# Patient Record
Sex: Male | Born: 1935
Health system: Southern US, Community
[De-identification: ages and names within clinical notes are randomized; demographics above are authoritative.]

## PROBLEM LIST (undated history)

## (undated) DIAGNOSIS — E0590 Thyrotoxicosis, unspecified without thyrotoxic crisis or storm: Secondary | ICD-10-CM

## (undated) DIAGNOSIS — E291 Testicular hypofunction: Secondary | ICD-10-CM

## (undated) DIAGNOSIS — R9431 Abnormal electrocardiogram [ECG] [EKG]: Secondary | ICD-10-CM

## (undated) DIAGNOSIS — H532 Diplopia: Secondary | ICD-10-CM

## (undated) DIAGNOSIS — K5790 Diverticulosis of intestine, part unspecified, without perforation or abscess without bleeding: Secondary | ICD-10-CM

## (undated) DIAGNOSIS — N529 Male erectile dysfunction, unspecified: Secondary | ICD-10-CM

## (undated) DIAGNOSIS — E785 Hyperlipidemia, unspecified: Secondary | ICD-10-CM

## (undated) DIAGNOSIS — Z8639 Personal history of other endocrine, nutritional and metabolic disease: Secondary | ICD-10-CM

## (undated) DIAGNOSIS — D126 Benign neoplasm of colon, unspecified: Secondary | ICD-10-CM

## (undated) DIAGNOSIS — K559 Vascular disorder of intestine, unspecified: Secondary | ICD-10-CM

## (undated) DIAGNOSIS — N401 Enlarged prostate with lower urinary tract symptoms: Secondary | ICD-10-CM

## (undated) DIAGNOSIS — M549 Dorsalgia, unspecified: Secondary | ICD-10-CM

## (undated) HISTORY — DX: Thyrotoxicosis, unspecified without thyrotoxic crisis or storm: E05.90

## (undated) HISTORY — DX: Benign prostatic hyperplasia with lower urinary tract symptoms: N40.1

## (undated) HISTORY — DX: Testicular hypofunction: E29.1

## (undated) HISTORY — DX: Diverticulosis of intestine, part unspecified, without perforation or abscess without bleeding: K57.90

## (undated) HISTORY — DX: Personal history of other endocrine, nutritional and metabolic disease: Z86.39

## (undated) HISTORY — DX: Vascular disorder of intestine, unspecified: K55.9

## (undated) HISTORY — DX: Male erectile dysfunction, unspecified: N52.9

## (undated) HISTORY — DX: Dorsalgia, unspecified: M54.9

## (undated) HISTORY — DX: Diplopia: H53.2

## (undated) HISTORY — DX: Hyperlipidemia, unspecified: E78.5

## (undated) HISTORY — DX: Abnormal electrocardiogram (ECG) (EKG): R94.31

## (undated) HISTORY — DX: Benign neoplasm of colon, unspecified: D12.6

## (undated) HISTORY — PX: COLONOSCOPY: SHX174

---

## 2002-03-30 DIAGNOSIS — D126 Benign neoplasm of colon, unspecified: Secondary | ICD-10-CM

## 2002-03-30 HISTORY — DX: Benign neoplasm of colon, unspecified: D12.6

## 2002-09-22 ENCOUNTER — Ambulatory Visit (HOSPITAL_COMMUNITY): Admission: RE | Admit: 2002-09-22 | Discharge: 2002-09-22 | Payer: Self-pay | Admitting: Gastroenterology

## 2002-09-22 ENCOUNTER — Encounter (INDEPENDENT_AMBULATORY_CARE_PROVIDER_SITE_OTHER): Payer: Self-pay | Admitting: Specialist

## 2003-09-13 ENCOUNTER — Encounter: Payer: Self-pay | Admitting: Internal Medicine

## 2004-09-09 ENCOUNTER — Ambulatory Visit: Payer: Self-pay | Admitting: Internal Medicine

## 2004-09-10 ENCOUNTER — Ambulatory Visit: Payer: Self-pay | Admitting: Internal Medicine

## 2005-07-09 ENCOUNTER — Ambulatory Visit: Payer: Self-pay | Admitting: Internal Medicine

## 2005-07-13 ENCOUNTER — Encounter: Admission: RE | Admit: 2005-07-13 | Discharge: 2005-07-13 | Payer: Self-pay | Admitting: Internal Medicine

## 2005-07-23 ENCOUNTER — Ambulatory Visit: Payer: Self-pay | Admitting: Internal Medicine

## 2005-07-30 ENCOUNTER — Encounter: Admission: RE | Admit: 2005-07-30 | Discharge: 2005-07-30 | Payer: Self-pay | Admitting: Internal Medicine

## 2005-08-06 ENCOUNTER — Ambulatory Visit: Payer: Self-pay | Admitting: Internal Medicine

## 2005-08-13 ENCOUNTER — Ambulatory Visit: Payer: Self-pay | Admitting: Gastroenterology

## 2005-12-18 ENCOUNTER — Ambulatory Visit: Payer: Self-pay | Admitting: Gastroenterology

## 2005-12-28 DIAGNOSIS — Z8719 Personal history of other diseases of the digestive system: Secondary | ICD-10-CM | POA: Insufficient documentation

## 2005-12-31 ENCOUNTER — Encounter (INDEPENDENT_AMBULATORY_CARE_PROVIDER_SITE_OTHER): Payer: Self-pay | Admitting: Specialist

## 2005-12-31 ENCOUNTER — Encounter: Payer: Self-pay | Admitting: Internal Medicine

## 2005-12-31 ENCOUNTER — Ambulatory Visit: Payer: Self-pay | Admitting: Gastroenterology

## 2006-11-08 ENCOUNTER — Telehealth (INDEPENDENT_AMBULATORY_CARE_PROVIDER_SITE_OTHER): Payer: Self-pay | Admitting: *Deleted

## 2006-12-08 ENCOUNTER — Ambulatory Visit: Payer: Self-pay | Admitting: Internal Medicine

## 2006-12-08 DIAGNOSIS — E0590 Thyrotoxicosis, unspecified without thyrotoxic crisis or storm: Secondary | ICD-10-CM

## 2006-12-08 DIAGNOSIS — R9431 Abnormal electrocardiogram [ECG] [EKG]: Secondary | ICD-10-CM

## 2006-12-08 HISTORY — DX: Abnormal electrocardiogram (ECG) (EKG): R94.31

## 2006-12-08 HISTORY — DX: Thyrotoxicosis, unspecified without thyrotoxic crisis or storm: E05.90

## 2006-12-16 ENCOUNTER — Encounter: Admission: RE | Admit: 2006-12-16 | Discharge: 2006-12-16 | Payer: Self-pay | Admitting: Internal Medicine

## 2006-12-20 ENCOUNTER — Encounter (INDEPENDENT_AMBULATORY_CARE_PROVIDER_SITE_OTHER): Payer: Self-pay | Admitting: *Deleted

## 2007-01-10 ENCOUNTER — Ambulatory Visit: Payer: Self-pay | Admitting: Internal Medicine

## 2007-01-12 LAB — CONVERTED CEMR LAB
Direct LDL: 136 mg/dL
Triglycerides: 143 mg/dL (ref 0–149)

## 2008-02-24 ENCOUNTER — Ambulatory Visit: Payer: Self-pay | Admitting: Internal Medicine

## 2008-02-24 DIAGNOSIS — M549 Dorsalgia, unspecified: Secondary | ICD-10-CM | POA: Insufficient documentation

## 2008-02-24 HISTORY — DX: Dorsalgia, unspecified: M54.9

## 2008-02-28 ENCOUNTER — Telehealth (INDEPENDENT_AMBULATORY_CARE_PROVIDER_SITE_OTHER): Payer: Self-pay | Admitting: *Deleted

## 2008-02-28 LAB — CONVERTED CEMR LAB
Basophils Absolute: 0 10*3/uL (ref 0.0–0.1)
Calcium: 9.3 mg/dL (ref 8.4–10.5)
Cholesterol: 222 mg/dL (ref 0–200)
Direct LDL: 125.7 mg/dL
GFR calc Af Amer: 84 mL/min
GFR calc non Af Amer: 70 mL/min
HCT: 39.4 % (ref 39.0–52.0)
Hemoglobin: 13.4 g/dL (ref 13.0–17.0)
Lymphocytes Relative: 42.3 % (ref 12.0–46.0)
MCHC: 33.9 g/dL (ref 30.0–36.0)
Monocytes Absolute: 0.5 10*3/uL (ref 0.1–1.0)
Monocytes Relative: 14.2 % — ABNORMAL HIGH (ref 3.0–12.0)
Neutro Abs: 1.2 10*3/uL — ABNORMAL LOW (ref 1.4–7.7)
PSA: 3.04 ng/mL (ref 0.10–4.00)
Platelets: 241 10*3/uL (ref 150–400)
Potassium: 4.6 meq/L (ref 3.5–5.1)
RDW: 13.1 % (ref 11.5–14.6)
Sodium: 142 meq/L (ref 135–145)
TSH: 0.47 microintl units/mL (ref 0.35–5.50)
Total CHOL/HDL Ratio: 3.8
Triglycerides: 97 mg/dL (ref 0–149)
VLDL: 19 mg/dL (ref 0–40)

## 2008-12-04 ENCOUNTER — Encounter (INDEPENDENT_AMBULATORY_CARE_PROVIDER_SITE_OTHER): Payer: Self-pay | Admitting: *Deleted

## 2009-02-22 ENCOUNTER — Encounter: Payer: Self-pay | Admitting: Internal Medicine

## 2009-02-26 ENCOUNTER — Ambulatory Visit: Payer: Self-pay | Admitting: Internal Medicine

## 2009-02-26 ENCOUNTER — Telehealth (INDEPENDENT_AMBULATORY_CARE_PROVIDER_SITE_OTHER): Payer: Self-pay | Admitting: *Deleted

## 2009-02-26 DIAGNOSIS — N401 Enlarged prostate with lower urinary tract symptoms: Secondary | ICD-10-CM

## 2009-02-26 DIAGNOSIS — N138 Other obstructive and reflux uropathy: Secondary | ICD-10-CM

## 2009-02-26 DIAGNOSIS — N400 Enlarged prostate without lower urinary tract symptoms: Secondary | ICD-10-CM

## 2009-02-26 HISTORY — DX: Benign prostatic hyperplasia with lower urinary tract symptoms: N40.1

## 2009-02-26 HISTORY — DX: Other obstructive and reflux uropathy: N13.8

## 2009-02-27 ENCOUNTER — Encounter: Payer: Self-pay | Admitting: Internal Medicine

## 2009-02-27 LAB — CONVERTED CEMR LAB: RBC / HPF: NONE SEEN (ref <3)

## 2009-03-06 ENCOUNTER — Encounter: Payer: Self-pay | Admitting: Internal Medicine

## 2009-03-11 ENCOUNTER — Encounter: Payer: Self-pay | Admitting: Internal Medicine

## 2009-05-30 ENCOUNTER — Ambulatory Visit: Payer: Self-pay | Admitting: Internal Medicine

## 2009-05-30 DIAGNOSIS — N529 Male erectile dysfunction, unspecified: Secondary | ICD-10-CM

## 2009-05-30 DIAGNOSIS — E785 Hyperlipidemia, unspecified: Secondary | ICD-10-CM

## 2009-05-30 HISTORY — DX: Male erectile dysfunction, unspecified: N52.9

## 2009-05-30 HISTORY — DX: Hyperlipidemia, unspecified: E78.5

## 2009-06-13 ENCOUNTER — Encounter: Payer: Self-pay | Admitting: Internal Medicine

## 2009-09-19 ENCOUNTER — Encounter: Payer: Self-pay | Admitting: Internal Medicine

## 2010-01-13 ENCOUNTER — Ambulatory Visit: Payer: Self-pay | Admitting: Internal Medicine

## 2010-01-13 DIAGNOSIS — J069 Acute upper respiratory infection, unspecified: Secondary | ICD-10-CM | POA: Insufficient documentation

## 2010-01-13 LAB — CONVERTED CEMR LAB
ALT: 27 units/L (ref 0–53)
AST: 31 units/L (ref 0–37)
Bilirubin Urine: NEGATIVE
Bilirubin, Direct: 0.1 mg/dL (ref 0.0–0.3)
Eosinophils Relative: 4.3 % (ref 0.0–5.0)
Folate: 18.3 ng/mL
GFR calc non Af Amer: 87.77 mL/min (ref >60)
HCT: 40.2 % (ref 39.0–52.0)
Hemoglobin: 13.9 g/dL (ref 13.0–17.0)
Lymphocytes Relative: 28.6 % (ref 12.0–46.0)
Lymphs Abs: 1.2 10*3/uL (ref 0.7–4.0)
Monocytes Relative: 16.2 % — ABNORMAL HIGH (ref 3.0–12.0)
Neutro Abs: 2.1 10*3/uL (ref 1.4–7.7)
Nitrite: NEGATIVE
Potassium: 4.9 meq/L (ref 3.5–5.1)
RBC: 4.47 M/uL (ref 4.22–5.81)
Sodium: 142 meq/L (ref 135–145)
TSH: 0.03 microintl units/mL — ABNORMAL LOW (ref 0.35–5.50)
Total Bilirubin: 0.7 mg/dL (ref 0.3–1.2)
Urobilinogen, UA: 0.2
WBC: 4.2 10*3/uL — ABNORMAL LOW (ref 4.5–10.5)

## 2010-01-16 LAB — CONVERTED CEMR LAB: Free T4: 2.91 ng/dL — ABNORMAL HIGH (ref 0.60–1.60)

## 2010-01-21 ENCOUNTER — Ambulatory Visit: Payer: Self-pay | Admitting: Endocrinology

## 2010-02-03 ENCOUNTER — Ambulatory Visit: Payer: Self-pay | Admitting: Internal Medicine

## 2010-02-03 DIAGNOSIS — E291 Testicular hypofunction: Secondary | ICD-10-CM

## 2010-02-03 HISTORY — DX: Testicular hypofunction: E29.1

## 2010-02-25 ENCOUNTER — Ambulatory Visit: Payer: Self-pay | Admitting: Endocrinology

## 2010-02-25 LAB — CONVERTED CEMR LAB: Free T4: 0.73 ng/dL (ref 0.60–1.60)

## 2010-03-17 ENCOUNTER — Encounter: Payer: Self-pay | Admitting: Internal Medicine

## 2010-04-27 LAB — CONVERTED CEMR LAB
ALT: 14 units/L
AST: 23 units/L
BUN: 14 mg/dL
BUN: 9 mg/dL (ref 6–23)
Calcium: 9.3 mg/dL
Calcium: 9.3 mg/dL (ref 8.4–10.5)
Chloride: 103 meq/L (ref 96–112)
Creatinine, Ser: 1.1 mg/dL (ref 0.4–1.5)
Free T4: 0.9 ng/dL (ref 0.6–1.6)
Free T4: 1.1 ng/dL (ref 0.6–1.6)
GFR calc non Af Amer: 70 mL/min
HCT: 45.6 %
HCT: 45.6 %
Hemoglobin: 13.5 g/dL (ref 13.0–17.0)
Nitrite: NEGATIVE
PSA: 2.42 ng/mL (ref 0.10–4.00)
PSA: 22.94 ng/mL — ABNORMAL HIGH (ref 0.10–4.00)
Platelets: 219 10*3/uL
RBC count: 5.03 10*6/uL
Sodium, serum: 140 mmol/L
Sodium: 139 meq/L (ref 135–145)
Specific Gravity, Urine: >=1.03
T3, Free: 3 pg/mL (ref 2.3–4.2)
TSH: 1.27 microintl units/mL (ref 0.35–5.50)
Total Bilirubin: 0.4 mg/dL
Urobilinogen, UA: 0.2
VLDL: 17.2 mg/dL (ref 0.0–40.0)
WBC, blood: 7.1 10*3/uL
WBC, blood: 7.1 10*3/uL
pH: 5
platelet count: 219 10*3/uL

## 2010-04-29 ENCOUNTER — Other Ambulatory Visit: Payer: Self-pay | Admitting: Endocrinology

## 2010-04-29 ENCOUNTER — Ambulatory Visit
Admission: RE | Admit: 2010-04-29 | Discharge: 2010-04-29 | Payer: Self-pay | Source: Home / Self Care | Attending: Endocrinology | Admitting: Endocrinology

## 2010-04-29 NOTE — Assessment & Plan Note (Signed)
Summary: 1 MTH FU  STC   Vital Signs:  Patient profile:   76 year old male Height:      69 inches (175.26 cm) Weight:      147.50 pounds (67.05 kg) BMI:     21.86 O2 Sat:      98 % on Room air Temp:     97.3 degrees F (36.28 degrees C) oral Pulse rate:   73 / minute Pulse rhythm:   regular BP sitting:   114 / 74  (left arm) Cuff size:   regular  Vitals Entered By: Brenton Grills CMA Duncan Dull) (February 25, 2010 3:46 PM)  O2 Flow:  Room air CC: 1 month F/U/pt is no longer taking Zithromax or Flonase/aj Is Patient Diabetic? No   CC:  1 month F/U/pt is no longer taking Zithromax or Flonase/aj.  History of Present Illness: he feels slightly better in general since on the tapazole.  however, appetite is still poor, and he still has fatigue.    Current Medications (verified): 1)  Mvt .Marland Kitchen.. 1 Qd 2)  Axiron 30 Mg/act Soln (Testosterone) .... Use 1 Pump in Each Axilla Once Daily 3)  Methimazole 10 Mg Tabs (Methimazole) .... 2 Tabs Two Times A Day 4)  Zithromax Z-Pak 250 Mg Tabs (Azithromycin) .... As Directed 5)  Flonase 50 Mcg/act Susp (Fluticasone Propionate) .... 2 Sprays On Each Side of The Nose Once Daily  Allergies (verified): No Known Drug Allergies  Past History:  Past Medical History: Last updated: 02/26/2009 chronic back pain HYPERTHYROIDISM  colonoscopy in 2004 and again in October 2007 repeat in 2012 ELECTROCARDIOGRAM, ABNORMAL, f/u by a normal   stress test on September 2005  h/o  Thyroid nodule, resolved by u/s 9-08  Review of Systems  The patient denies fever.    Physical Exam  General:  normal appearance.   Neck:  thyroid is minimally and diffusely enlarged   Impression & Recommendations:  Problem # 1:  HYPERTHYROIDISM (ICD-242.90) overcontrolled  Medications Added to Medication List This Visit: 1)  Methimazole 10 Mg Tabs (Methimazole) .Marland Kitchen.. 1 tab once daily  Other Orders: TLB-TSH (Thyroid Stimulating Hormone) (84443-TSH) TLB-T4 (Thyrox), Free  [phone]) Est. Patient Level III (40981)  Patient Instructions: 1)  blood tests are being ordered for you today.  please call [phone] to hear your test results. 2)  pending the test results, please continue the same medications for now. 3)  Please schedule a follow-up appointment in 2 months 4)  (update: i left message on phone-tree:  reduce tapazole to 10 mg once daily). Prescriptions: METHIMAZOLE 10 MG TABS (METHIMAZOLE) 1 tab once daily  #30 x 2   Entered and Authorized by:   Minus Breeding MD   Signed by:   Minus Breeding MD on 02/25/2010   Method used:   [facility] to        [facility] [address] [phone]* (retail)       [address]       [place], [place]  [address]       Ph: [phone] or [phone]       Fax: [phone]   RxID:   [id number]    Orders Added: 1)  TLB-TSH (Thyroid Stimulating Hormone) [84443-TSH] 2)  TLB-T4 (Thyrox), Free [33295-JO8C] 3)  Est. Patient Level III [16606]

## 2010-04-29 NOTE — Letter (Signed)
Summary: [facility]  [facility]   Imported By: Lanelle Bal 06/19/2009 09:41:38  _____________________________________________________________________  External Attachment:    Type:   Image     Comment:   External Document

## 2010-04-29 NOTE — Assessment & Plan Note (Signed)
Summary: 3 WEEK FOLLOWUP///SPH   Vital Signs:  Patient profile:   77 year old male Weight:      148 pounds Pulse rate:   104 / minute Pulse rhythm:   regular BP sitting:   122 / 76  (left arm) Cuff size:   regular  Vitals Entered By: Army Fossa CMA (February 03, 2010 1:38 PM) [facility]: 3 week f/u- not fasting, Depression Comments c/o HA today [facility]   History of Present Illness: ROV note from Dr Everardo All reviewed  good medication compliance w/ thyroid medicine    Current Medications (verified): 1)  Mvt .Marland Kitchen.. 1 Qd 2)  Axiron 30 Mg/act Soln (Testosterone) .... Use 1 Pump in Each Axilla Once Daily 3)  Methimazole 10 Mg Tabs (Methimazole) .... 2 Tabs Two Times A Day  Allergies (verified): No Known Drug Allergies  Past History:  Past Medical History: Reviewed history from 02/26/2009 and no changes required. chronic back pain HYPERTHYROIDISM  colonoscopy in 2004 and again in October 2007 repeat in 2012 ELECTROCARDIOGRAM, ABNORMAL, f/u by a normal   stress test on September 2005  h/o  Thyroid nodule, resolved by u/s 9-08  Past Surgical History: Reviewed history from 02/24/2008 and no changes required. no  Social History: Reviewed history from 01/21/2010 and no changes required. 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  afebrile 98.4  Head:  face symetric, no tender  Ears:  R ear normal.  L-- wax  Nose:  slightly  congested  Mouth:  no redness or d/c  Lungs:  normal respiratory effort, no intercostal retractions, no accessory muscle use, and normal breath sounds.   Heart:  HR 104    Impression &  Recommendations:  Problem # 1:  URI (ICD-465.9) persistent URI symptoms , Rx zpack  aware to call if fever   Problem # 2:  HYPERTHYROIDISM (ICD-242.90) per Dr Everardo All reminded patient to RTC 02-21-10 to see him  His updated medication list for this problem includes:    Methimazole 10 Mg Tabs (Methimazole) .Marland Kitchen... 2 tabs two times a day  Problem # 3:  WEIGHT LOSS (ICD-783.21) anticipate will get better w/ Rx for #2  Problem # 4:  HYPOGONADISM (ICD-257.2) on axeron per urology  Complete Medication List: 1)  Mvt  .Marland Kitchen.. 1 qd 2)  Axiron 30 Mg/act Soln (Testosterone) .... Use 1 pump in each axilla once daily 3)  Methimazole 10 Mg Tabs (Methimazole) .... 2 tabs two times a day 4)  Zithromax Z-pak 250 Mg Tabs (Azithromycin) .... As directed 5)  Flonase 50 Mcg/act Susp (Fluticasone propionate) .... 2 sprays on each side of the nose once daily  Patient Instructions: 1)  rest, fluids, tylenol 2)  zpack x 5 days, an  antibiotic 3)  flonase 2 sprays on each side of the nose once a day x 1 month 4)  call if no better in 10 days  5)  Please schedule a follow-up appointment in 6 months .  Prescriptions: FLONASE 50 MCG/ACT SUSP (FLUTICASONE PROPIONATE) 2 sprays on each side of the nose once daily  #1  x 1   Entered and Authorized by:   Nolon Rod. Arby Dahir MD   Signed by:   Nolon Rod. Livana Yerian MD on 02/03/2010   Method used:   Print then Give to Patient   RxID:   [id number] ZITHROMAX Z-PAK 250 MG TABS (AZITHROMYCIN) as directed  #1 x 0   Entered and Authorized by:   Nolon Rod. Lunell Robart MD   Signed by:   Nolon Rod. Hephzibah Strehle MD on 02/03/2010   Method used:   Print then Give to Patient   RxID:   [id number]    Orders Added: 1)  Est. Patient Level III [84696]

## 2010-04-29 NOTE — Assessment & Plan Note (Signed)
Summary: New Endo / Medicare / Hyperthyroidism, wt loss/Paz/cd   Vital Signs:  Patient profile:   77 year old male Height:      69 inches (175.26 cm) Weight:      147.50 pounds (67.05 kg) BMI:     21.86 O2 Sat:      95 % on Room air Temp:     98.1 degrees F (36.72 degrees C) oral Pulse rate:   104 / minute BP sitting:   120 / 76  (left arm) Cuff size:   regular  Vitals Entered By: Brenton Grills MA (January 21, 2010 3:54 PM)  O2 Flow:  Room air CC: New Endo/Hyperthyroid, Weight Loss/aj Is Patient Diabetic? No   CC:  New Endo/Hyperthyroid and Weight Loss/aj.  History of Present Illness: pt was noted on routine labs to have abnormal tft.  tsh was normal in 2010.   symptomatically, pt states 2 mos of slight warmth of the skin, and associated weight loss of 10 lbs.  he requests not to take i-131 rx, as he says it caused his wife to have weight gain.  Current Medications (verified): 1)  Mvt .Marland Kitchen.. 1 Qd 2)  Axiron 30 Mg/act Soln (Testosterone) .... Use 1 Pump in Each Axilla Once Daily  Allergies (verified): No Known Drug Allergies  Past History:  Past Medical History: Last updated: 02/26/2009 chronic back pain HYPERTHYROIDISM  colonoscopy in 2004 and again in October 2007 repeat in 2012 ELECTROCARDIOGRAM, ABNORMAL, f/u by a normal   stress test on September 2005  h/o  Thyroid nodule, resolved by u/s 9-08  Family History: MI--father had a heart attack late in life colon ca--sister  prostate cancer--no DM - no stroke - GP HTN - ?? no goiter or other thyroid problem  Social History: Reviewed history from 02/26/2009 and no changes required. 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         denies headache, hoarseness, double vision, palpitations, sob, diarrhea, myalgias, excessive diaphoresis, numbness, tremor, anxiety, hypoglycemia, easy  bruising, and rhinorrhea.  he has nocturia.  Physical Exam  General:  normal appearance.   Head:  head: no deformity eyes: no periorbital swelling, no proptosis external nose and ears are normal mouth: no lesion seen Neck:  thyroid is minimally and diffusely enlarged Lungs:  Clear to auscultation bilaterally. Normal respiratory effort.  Heart:  Regular rate and rhythm without murmurs or gallops noted. Normal S1,S2.   Abdomen:  abdomen is soft, nontender.  no hepatosplenomegaly.   not distended.  no hernia  Msk:  muscle bulk and strength are grossly normal.  no obvious joint swelling.  gait is normal and steady  Pulses:  dorsalis pedis intact bilat.   Extremities:  no deformity.  no ulcer on the feet.  feet are of normal color and temp.  no edema  Neurologic:  cn 2-12 grossly intact.   readily moves all 4's.   sensation is intact to touch on all 4's Skin:  normal texture and temp.  no rash.  not diaphoretic  Cervical Nodes:  No significant adenopathy.  Psych:  Alert and cooperative; normal mood and affect; normal attention span and concentration.     Impression & Recommendations:  Problem # 1:  HYPERTHYROIDISM (ICD-242.90) given how high his thyroid hormone levels are, he should take thoinamide rx, at least for now  Problem # 2:  WEIGHT  LOSS (ICD-783.21) due to #1  Problem # 3:  HYPERLIPIDEMIA, MILD (ICD-272.4) labs could be falsely improved by #1  Medications Added to Medication List This Visit: 1)  Axiron 30 Mg/act Soln (Testosterone) .... Use 1 pump in each axilla once daily 2)  Methimazole 10 Mg Tabs (Methimazole) .... 2 tabs two times a day  Other Orders: Est. Patient Level V (13086)  Patient Instructions: 1)  take methimazole 2x10 mg two times a day 2)  if ever you have fever while taking this medication, stop it and call us, because of the risk of a rare side-effect 3)  Please schedule a follow-up appointment in 1 month. Prescriptions: METHIMAZOLE 10 MG TABS  (METHIMAZOLE) 2 tabs two times a day  #120 x 1   Entered and Authorized by:   Minus Breeding MD   Signed by:   Minus Breeding MD on 01/21/2010   Method used:   [facility]        [facility] [address] [phone]* (retail)       [address]       [place], [place]  [address]       Ph: [phone] or [phone]       Fax: [phone]   RxID:   [id number]    Orders Added: 1)  Est. Patient Level V [16606]

## 2010-04-29 NOTE — Letter (Signed)
Summary: [facility]  [facility]   Imported By: Lanelle Bal 10/02/2009 15:12:43  _____________________________________________________________________  External Attachment:    Type:   Image     Comment:   External Document

## 2010-04-29 NOTE — Assessment & Plan Note (Signed)
Summary: 3 MONTH FOLLOWUP///SPH//rsh//lch   Vital Signs:  Patient profile:   77 year old male Height:      69 inches Weight:      161.4 pounds BMI:     23.92 Pulse rate:   70 / minute BP sitting:   120 / 60  Vitals Entered By: Shary Decamp (May 30, 2009 10:56 AM)  History of Present Illness: recently seeing with a UTI and elevated PSA status-post antibiotics status-post evaluation by urology, he was told to recheck his PSA  after antibiotics.  Patient is unsure if that was rechecked at the urologist office  Allergies: No Known Drug Allergies  Past History:  Past Medical History: Reviewed history from 02/26/2009 and no changes required. chronic back pain HYPERTHYROIDISM  colonoscopy in 2004 and again in October 2007 repeat in 2012 ELECTROCARDIOGRAM, ABNORMAL, f/u by a normal   stress test on September 2005  h/o  Thyroid nodule, resolved by u/s 9-08  Review of Systems       doing well, denies any dysuria, hematuria, discomfort with urination or fever also complained of Viagra now working as much as he would like.  Physical Exam  General:  alert and well-developed.   Lungs:  normal respiratory effort, no intercostal retractions, no accessory muscle use, and normal breath sounds.   Heart:  normal rate, regular rhythm, no murmur, and no gallop.   Abdomen:  soft and non-tender.     Impression & Recommendations:  Problem # 1:  UTI (ICD-599.0) status post antibiotics  Problem # 2:  HYPERTROPHY PROSTATE W/UR OBST & OTH LUTS (ICD-600.01) recently seeing with a UTI and elevated PSA status-post evaluation by urology, he was told to recheck this  after antibiotics.  Patient is unsure if that was rechecked at the urologist office plan: Check a PSA reasses on  return to the office  Orders: Venipuncture ([address]109) TLB-PSA (Prostate Specific Antigen) (84153-PSA)  Problem # 3:  ERECTILE DYSFUNCTION, ORGANIC (ICD-607.84) Viagra no working as well as he desires trial with  Cialis 20 mg every other  day as needed, samples provided His updated medication list for this problem includes:    Viagra 100 Mg Tabs (Sildenafil citrate) .Marland Kitchen... As directed  Problem # 4:  HYPERLIPIDEMIA, MILD (ICD-272.4) labs discussed, information about the healthy diet provided Labs Reviewed: SGOT: 23 (02/22/2009)   SGPT: 14 (02/22/2009)   HDL:47.50 (02/26/2009), 58.6 (02/24/2008)  LDL:DEL (02/24/2008), DEL (01/10/2007)  Chol:231 (02/26/2009), 222 (02/24/2008)  Trig:86.0 (02/26/2009), 97 (02/24/2008)  Complete Medication List: 1)  Viagra 100 Mg Tabs (Sildenafil citrate) .... As directed 2)  Mvt  .Marland Kitchen.. 1 qd  Patient Instructions: 1)  stop Viagra 2)  Cialis 20 mg: Take one tablet no more frequent than every other day.  See how that works.  Side effects are  similar to Viagra 3)  Please schedule a follow-up appointment in 4 months .

## 2010-04-29 NOTE — Assessment & Plan Note (Signed)
Summary: weight loss   Vital Signs:  Patient profile:   77 year old male Weight:      151.13 pounds Temp:     97.9 degrees F oral Pulse rate:   103 / minute Pulse rhythm:   regular BP sitting:   126 / 88  (left arm) Cuff size:   large  Vitals Entered By: Army Fossa CMA (January 13, 2010 11:12 AM) CC: Pt here to discuss weight loss, fasting Comments c/o nasal congestion  feeling down [facility] [address] Fasting   History of Present Illness: CC today is weight loss, this is going on for few months, this is unintentional, thinks that his appetite is okay most of the time and he is getting enough nutrition.  in addition, he is having a cold for 10 days, symptoms are on and off, he felt very rundown a week ago and again today. Mild to no cough He does have sinus congestion without discharge  denies to me feeling sad, depressed, or  tearful; he is just   feeling poorly from the cold  ROS Denies fevers No night sweats No nausea, vomiting, diarrhea or blood in the stools Has a history of low testosterone diagnosed and treated by urology, he was prescribed HRT but he has been reluctant and has not taken it.Marland Kitchen No chest pain or palpitations No dysuria or gross hematuria  Current Medications (verified): 1)  Mvt .Marland Kitchen.. 1 Qd  Allergies (verified): No Known Drug Allergies  Past History:  Past Medical History: Reviewed history from 02/26/2009 and no changes required. chronic back pain HYPERTHYROIDISM  colonoscopy in 2004 and again in October 2007 repeat in 2012 ELECTROCARDIOGRAM, ABNORMAL, f/u by a normal   stress test on September 2005  h/o  Thyroid nodule, resolved by u/s 9-08  Past Surgical History: Reviewed history from 02/24/2008 and no changes required. no  Social History: Reviewed history from 02/26/2009 and no changes required. 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 Before 6 months his weight was relatively stable. Nontoxic , no acutely or chronically ill apperaing , he does look underweight Head:  face symmetric, nontender to palpation Eyes:  no pale Ears:  R ear normal and L ear normal.   Nose:  slightly congested Mouth:  no redness or discharge Neck:  no thyromegaly Lungs:  normal respiratory effort, no intercostal retractions, no accessory muscle use, and normal breath sounds.   Heart:  heart rate 105, no murmur Abdomen:  soft, non-tender, no distention, no masses, no guarding, and no rigidity.  soft, non-tender, no distention, no masses, no guarding, and no rigidity.   Extremities:  no edema Psych:  not anxious appearing and not depressed appearing.  not anxious appearing and not depressed appearing.     Impression & Recommendations:  Problem # 1:  WEIGHT LOSS (ICD-783.21) weight loss for a few months, no obvious  etiology by  history & physical he does have a h/o  of thyroid disease , and a intercurrent illnes, see #2 Not depression.  plan: LABS chest x-ray Reassess in 3 weeks  Orders: Venipuncture (16109) TLB-BMP (Basic Metabolic Panel-BMET) (80048-METABOL) TLB-CBC Platelet - w/Differential (85025-CBCD) TLB-TSH (Thyroid Stimulating Hormone) (84443-TSH) TLB-B12 + Folate Pnl (60454_09811-B14/NWG) TLB-Hepatic/Liver Function Pnl (80076-HEPATIC) T-2 View CXR, Same Day (71020.5TC)  Problem # 2:  URI (ICD-465.9) URI/viral illness for a few days Labs and x-ray Will treat symptomatically for now  Complete Medication List: 1)  Mvt  .Marland Kitchen.. 1 qd  Patient Instructions: 1)  rest, fluids, Tylenol and Robitussin-DM for the respiratory tract infection 2)  call  any time if the symptoms get worse or if you have  high fever 3)  Please schedule a follow-up appointment in 3  weeks.    Orders Added: 1)  Venipuncture [36415] 2)  TLB-BMP  (Basic Metabolic Panel-BMET) [80048-METABOL] 3)  TLB-CBC Platelet - w/Differential [85025-CBCD] 4)  TLB-TSH (Thyroid Stimulating Hormone) [84443-TSH] 5)  TLB-B12 + Folate Pnl [82746_82607-B12/FOL] 6)  TLB-Hepatic/Liver Function Pnl [80076-HEPATIC] 7)  T-2 View CXR, Same Day [71020.5TC] 8)  Est. Patient Level IV [16109]    Laboratory Results   Urine Tests    Routine Urinalysis   Color: yellow Appearance: Clear Glucose: negative   (Normal Range: Negative) Bilirubin: negative   (Normal Range: Negative) Ketone: negative   (Normal Range: Negative) Spec. Gravity: 1.015   (Normal Range: 1.003-1.035) Blood: negative   (Normal Range: Negative) pH: 6.0   (Normal Range: 5.0-8.0) Protein: negative   (Normal Range: Negative) Urobilinogen: 0.2   (Normal Range: 0-1) Nitrite: negative   (Normal Range: Negative) Leukocyte Esterace: negative   (Normal Range: Negative)    Comments: Army Fossa CMA  January 13, 2010 12:01 PM

## 2010-04-30 LAB — TSH: TSH: 59.72 u[IU]/mL — ABNORMAL HIGH (ref 0.35–5.50)

## 2010-04-30 LAB — T4, FREE: Free T4: 0.3 ng/dL — ABNORMAL LOW (ref 0.60–1.60)

## 2010-05-01 NOTE — Letter (Signed)
Summary: [facility]  [facility]   Imported By: Lanelle Bal 03/27/2010 12:27:37  _____________________________________________________________________  External Attachment:    Type:   Image     Comment:   External Document

## 2010-05-07 NOTE — Assessment & Plan Note (Signed)
Summary: 2 MTH FU---STC   Vital Signs:  Patient profile:   77 year old male Height:      69 inches (175.26 cm) Weight:      153 pounds (69.55 kg) BMI:     22.68 O2 Sat:      96 % on Room air Temp:     98.6 degrees F (37.00 degrees C) oral Pulse rate:   78 / minute BP sitting:   122 / 88  (left arm) Cuff size:   regular  Vitals Entered By: Brenton Grills CMA Duncan Dull) (April 29, 2010 3:34 PM)  O2 Flow:  Room air CC: 2 month F/U/aj Is Patient Diabetic? No   CC:  2 month F/U/aj.  History of Present Illness: he feels slightly better in general since on the tapazole.  he went back to 10 mg bid.  however, appetite is improved, and he has gained a few lbs.    Current Medications (verified): 1)  Mvt .Marland Kitchen.. 1 Qd 2)  Axiron 30 Mg/act Soln (Testosterone) .... Use 1 Pump in Each Axilla Once Daily 3)  Methimazole 10 Mg Tabs (Methimazole) .Marland Kitchen.. 1 Tab Once Daily  Allergies (verified): No Known Drug Allergies  Past History:  Past Medical History: Last updated: 02/26/2009 chronic back pain HYPERTHYROIDISM  colonoscopy in 2004 and again in October 2007 repeat in 2012 ELECTROCARDIOGRAM, ABNORMAL, f/u by a normal   stress test on September 2005  h/o  Thyroid nodule, resolved by u/s 9-08  Review of Systems  The patient denies fever.    Physical Exam  General:  normal appearance.   Neck:  thyroid is minimally and diffusely enlarged. Additional Exam:  FastTSH              [H]  59.72 uIU/mL                0.35-5.50 Free T4              [L]  0.30 ng/dL      Impression & Recommendations:  Problem # 1:  HYPERTHYROIDISM (ICD-242.90) overcontrolled  Other Orders: TLB-TSH (Thyroid Stimulating Hormone) (84443-TSH) TLB-T4 (Thyrox), Free [phone]) Est. Patient Level III (40981)  Patient Instructions: 1)  blood tests are being ordered for you today.  please call [phone] to hear your test results. 2)  Please schedule a follow-up appointment in 2 months 3)  (update: i left message  on phone-tree:  stop tapazole.  ret 1 month)   Orders Added: 1)  TLB-TSH (Thyroid Stimulating Hormone) [84443-TSH] 2)  TLB-T4 (Thyrox), Free [95621-HY8M] 3)  Est. Patient Level III [57846]

## 2010-06-02 ENCOUNTER — Encounter: Payer: Self-pay | Admitting: Endocrinology

## 2010-06-02 ENCOUNTER — Ambulatory Visit (INDEPENDENT_AMBULATORY_CARE_PROVIDER_SITE_OTHER): Payer: Medicare Other | Admitting: Endocrinology

## 2010-06-02 ENCOUNTER — Other Ambulatory Visit: Payer: Self-pay | Admitting: Endocrinology

## 2010-06-02 ENCOUNTER — Other Ambulatory Visit: Payer: Medicare Other

## 2010-06-02 DIAGNOSIS — E0590 Thyrotoxicosis, unspecified without thyrotoxic crisis or storm: Secondary | ICD-10-CM

## 2010-06-02 LAB — TSH: TSH: 0.16 u[IU]/mL — ABNORMAL LOW (ref 0.35–5.50)

## 2010-06-10 NOTE — Assessment & Plan Note (Signed)
Summary: 1 mos f/u   Vital Signs:  Patient profile:   77 year old male Height:      69 inches (175.26 cm) Weight:      156.13 pounds (70.97 kg) BMI:     23.14 O2 Sat:      94 % on Room air Temp:     98.3 degrees F (36.83 degrees C) oral Pulse rate:   87 / minute Pulse rhythm:   regular BP sitting:   108 / 80  (left arm) Cuff size:   regular  Vitals Entered By: Brenton Grills CMA Duncan Dull) (June 02, 2010 1:06 PM)  O2 Flow:  Room air CC: 1 month F/U/aj Is Patient Diabetic? No   CC:  1 month F/U/aj.  History of Present Illness: pt has h/o hyperthyroidism.  he chose tapazole instead of i-131 rx.  at last ov, pt was advised to d/c tapazole, due to high tsh.  however, he had weight loss, so he resumed it at 10 mg every other day.  no tremor or palpitations.    Current Medications (verified): 1)  Mvt .Marland Kitchen.. 1 Qd 2)  Axiron 30 Mg/act Soln (Testosterone) .... Use 1 Pump in Each Axilla Once Daily  Allergies (verified): No Known Drug Allergies  Past History:  Past Medical History: Last updated: 02/26/2009 chronic back pain HYPERTHYROIDISM  colonoscopy in 2004 and again in October 2007 repeat in 2012 ELECTROCARDIOGRAM, ABNORMAL, f/u by a normal   stress test on September 2005  h/o  Thyroid nodule, resolved by u/s 9-08  Review of Systems  The patient denies fever.    Physical Exam  General:  normal appearance.   Eyes:  there is slight left proptosis Neck:  thyroid is minimally and diffusely enlarged. Additional Exam:  FastTSH              [L]  0.16 uIU/mL                 0.35-5.50 Free T4                   1.12 ng/dL     Impression & Recommendations:  Problem # 1:  HYPERTHYROIDISM (ICD-242.90) Assessment Improved  Orders: TLB-TSH (Thyroid Stimulating Hormone) (84443-TSH) TLB-T4 (Thyrox), Free [phone]) Est. Patient Level III (28413)  Medications Added to Medication List This Visit: 1)  Methimazole 10 Mg Tabs (Methimazole) .Marland Kitchen.. 1 tab once daily  Patient  Instructions: 1)  blood tests are being ordered for you today.  please call [phone] to hear your test results. 2)  Please schedule a follow-up appointment in 2 months. 3)  (update: i left message on phone-tree:  take tapazole 10 mg once daily) Prescriptions: METHIMAZOLE 10 MG TABS (METHIMAZOLE) 1 tab once daily  #30 x 3   Entered and Authorized by:   Minus Breeding MD   Signed by:   Minus Breeding MD on 06/02/2010   Method used:   Electronically to        [facility] [address] [phone]* (retail)       [address]       [place], [place]  [address]       Ph: [phone] or [phone]       Fax: [phone]   RxID:   [id number]    Orders Added: 1)  TLB-TSH (Thyroid Stimulating Hormone) [84443-TSH] 2)  TLB-T4 (Thyrox), Free [02542-HC6C] 3)  Est. Patient Level III [37628]

## 2010-07-01 ENCOUNTER — Ambulatory Visit: Payer: Medicare Other | Admitting: Endocrinology

## 2010-07-03 ENCOUNTER — Ambulatory Visit: Payer: Medicare Other | Admitting: Endocrinology

## 2010-07-21 ENCOUNTER — Encounter: Payer: Self-pay | Admitting: Endocrinology

## 2010-07-21 ENCOUNTER — Other Ambulatory Visit (INDEPENDENT_AMBULATORY_CARE_PROVIDER_SITE_OTHER): Payer: Medicare Other

## 2010-07-21 ENCOUNTER — Ambulatory Visit (INDEPENDENT_AMBULATORY_CARE_PROVIDER_SITE_OTHER): Payer: Medicare Other | Admitting: Endocrinology

## 2010-07-21 VITALS — BP 108/68 | HR 67 | Temp 97.7°F | Ht 69.5 in | Wt 154.0 lb

## 2010-07-21 DIAGNOSIS — E0590 Thyrotoxicosis, unspecified without thyrotoxic crisis or storm: Secondary | ICD-10-CM

## 2010-07-21 MED ORDER — METHIMAZOLE 5 MG PO TABS: 5.0000 mg | ORAL_TABLET | Freq: Every day | ORAL | Status: DC

## 2010-07-21 NOTE — Progress Notes (Signed)
  Subjective:    Patient ID: Tommy Browning, male    DOB: 1929/07/11, 77 y.o.   MRN: [id number]  HPI Pt ret for f/u of hyperthyroidism.  He chose thionamide rx.  Since last ov, he feels better, and well in general.  His weight has leveled off.  Past Medical History  Diagnosis Date  . BACK PAIN 02/24/2008  . HYPERTHYROIDISM 12/08/2006  . HYPOGONADISM 02/03/2010  . HYPERLIPIDEMIA, MILD 05/30/2009  . HYPERTROPHY PROSTATE W/UR OBST & OTH LUTS 02/26/2009  . ERECTILE DYSFUNCTION, ORGANIC 05/30/2009  . ELECTROCARDIOGRAM, ABNORMAL 12/08/2006    F/U by a normal stress test on September 2005  . COLONOSCOPY, HX OF 12/28/2005  . History of thyroid nodule     Resolved by [place] 11/2006   No past surgical history on file.  reports that he has been smoking Cigarettes.  He does not have any smokeless tobacco history on file. He reports that he drinks alcohol. His drug history not on file. family history includes Cancer in his sister and Stroke in his other.  There is no history of Diabetes and Thyroid disease. No Known Allergies  Review of Systems Denies fever    Objective:   Physical Exam GENERAL: no distress Neck - No masses or thyromegaly or limitation in range of motion Neuro:  No tremor Skin: not diaphoretic     Lab Results  Component Value Date   TSH 7.28* 07/21/2010      Assessment & Plan:  Hyperthyroidism, overcontrolled.

## 2010-07-21 NOTE — Patient Instructions (Addendum)
blood tests are being ordered for you today.  please call [phone] to hear your test results. pending the test results, please continue the same methimazole for now Please make a follow-up appointment in 3 months if ever you have fever while taking this medication, stop it and call us, because of the risk of a rare side-effect (update: i left message on phone-tree:  Reduce tapazole to 5 mg qd. Ret 2-3 mos)

## 2010-08-05 ENCOUNTER — Ambulatory Visit: Payer: Self-pay | Admitting: Internal Medicine

## 2010-08-06 ENCOUNTER — Ambulatory Visit (INDEPENDENT_AMBULATORY_CARE_PROVIDER_SITE_OTHER): Payer: Medicare Other | Admitting: Internal Medicine

## 2010-08-06 ENCOUNTER — Encounter: Payer: Self-pay | Admitting: Internal Medicine

## 2010-08-06 VITALS — BP 126/82 | HR 75 | Wt 156.0 lb

## 2010-08-06 DIAGNOSIS — H532 Diplopia: Secondary | ICD-10-CM

## 2010-08-06 LAB — BASIC METABOLIC PANEL
BUN: 15 mg/dL (ref 6–23)
Chloride: 103 mEq/L (ref 96–112)
Creatinine, Ser: 1.1 mg/dL (ref 0.4–1.5)
GFR: 85.76 mL/min (ref >60.00)
Glucose, Bld: 78 mg/dL (ref 70–99)
Potassium: 4.6 mEq/L (ref 3.5–5.1)

## 2010-08-06 NOTE — Progress Notes (Signed)
  Subjective:    Patient ID: Tommy Browning, male    DOB: 1929-10-18, 77 y.o.   MRN: [id number]  HPI Developed diplopia about 4 weeks ago, he sees double whenever he tries to see to the extreme right or extreme left. Very rarely happen when he sees straight. Symptoms are not consistent every time he moved his eyes. Went to see his ophthalmologist, he was told that his eyes were okay and was referred to me. Stroke?.  Past Medical History  Diagnosis Date  . BACK PAIN 02/24/2008  . HYPERTHYROIDISM 12/08/2006  . HYPOGONADISM 02/03/2010  . HYPERLIPIDEMIA, MILD 05/30/2009  . HYPERTROPHY PROSTATE W/UR OBST & OTH LUTS 02/26/2009  . ERECTILE DYSFUNCTION, ORGANIC 05/30/2009  . ELECTROCARDIOGRAM, ABNORMAL 12/08/2006    F/U by a normal stress test on September 2005  . COLONOSCOPY, HX OF 12/28/2005  . History of thyroid nodule     Resolved by [place] 11/2006   No past surgical history on file. Family History  Problem Relation Age of Onset  . Cancer Sister     Colon Cancer  . Diabetes Neg Hx   . Thyroid disease Neg Hx     no goiter or other thyroid problem  . Stroke Other     GP      Review of Systems Denies any syncope altered loss of consciousness. No chest pain or palpitations No dizziness, tinnitus or decreased hearing. Currently having no sinus issues. Denies any headache, slurred speech, focal deficits, tingling or numbness in the face or extremities.     Objective:   Physical Exam  Constitutional: He is oriented to person, place, and time. He appears well-developed and well-nourished. No distress.  HENT:  Head: Normocephalic and atraumatic.  Right Ear: External ear normal.  Left Ear: External ear normal.       Face is symmetric, not tender to palpation.  Neck:       Normal bilateral carotid pulses  Cardiovascular: Normal rate, regular rhythm and normal heart sounds.   No murmur heard. Pulmonary/Chest: Effort normal. No respiratory distress. He has no wheezes. He has rales.    Neurological: He is alert and oriented to person, place, and time. No cranial nerve deficit.       Motor exam is symmetric. DTRs symmetric. Tongue midline. Speech normal, gait normal  Skin: Skin is warm and dry. He is not diaphoretic.  Psychiatric: He has a normal mood and affect. His behavior is normal. Judgment and thought content normal.          Assessment & Plan:

## 2010-08-06 NOTE — Assessment & Plan Note (Addendum)
4 weeks history of diplopia. No associated symptoms such as HAs, slurred speech or dizziness.  Status post ophthalmology evaluation which was essentially negative according to the patient. Plan: MRI to rule out recent stroke or a  intracranial lesion. In the meantime, patient is encouraged to call me if something changes

## 2010-08-08 ENCOUNTER — Other Ambulatory Visit: Payer: Medicare Other

## 2010-08-12 ENCOUNTER — Ambulatory Visit
Admission: RE | Admit: 2010-08-12 | Discharge: 2010-08-12 | Disposition: A | Discharge status: Home / Self Care | Payer: Medicare Other | Source: Ambulatory Visit | Attending: Internal Medicine | Admitting: Internal Medicine

## 2010-08-12 DIAGNOSIS — H532 Diplopia: Secondary | ICD-10-CM

## 2010-08-12 MED ORDER — GADOBENATE DIMEGLUMINE 529 MG/ML IV SOLN
14.0000 mL | Freq: Once | INTRAVENOUS | Status: AC | PRN
  Administered 2010-08-12: 14 mL via INTRAVENOUS

## 2010-08-14 ENCOUNTER — Telehealth: Payer: Self-pay | Admitting: *Deleted

## 2010-08-14 NOTE — Telephone Encounter (Signed)
Pt is aware.  Is he supposed to be referred to neuro or opthalmology?

## 2010-08-14 NOTE — Telephone Encounter (Signed)
Message left for patient to return my call.  

## 2010-08-14 NOTE — Telephone Encounter (Signed)
Message copied by Army Fossa on Thu Aug 14, 2010 10:12 AM ------      Message from: Tommy Browning      Created: Thu Aug 14, 2010  6:37 AM       Advise patient:      MRI normal except for atherosclerosis (not an unexpected finding)      Needs to call if double vision is severe or no better in 2 weeks to neurology referal.      Forward MRI to his ophtalmologist

## 2010-08-14 NOTE — Telephone Encounter (Signed)
If he is not better, will need neurology referal

## 2010-08-15 NOTE — Telephone Encounter (Signed)
I spoke w/ pt he is aware, will send MRI to Dr.Groat.

## 2010-09-30 ENCOUNTER — Ambulatory Visit (INDEPENDENT_AMBULATORY_CARE_PROVIDER_SITE_OTHER): Payer: Medicare Other | Admitting: Endocrinology

## 2010-09-30 ENCOUNTER — Encounter: Payer: Self-pay | Admitting: Endocrinology

## 2010-09-30 ENCOUNTER — Other Ambulatory Visit (INDEPENDENT_AMBULATORY_CARE_PROVIDER_SITE_OTHER): Payer: Medicare Other

## 2010-09-30 VITALS — BP 104/74 | HR 85 | Temp 98.4°F | Ht 70.0 in | Wt 159.2 lb

## 2010-09-30 DIAGNOSIS — E0590 Thyrotoxicosis, unspecified without thyrotoxic crisis or storm: Secondary | ICD-10-CM

## 2010-09-30 LAB — TSH: TSH: 0.06 u[IU]/mL — ABNORMAL LOW (ref 0.35–5.50)

## 2010-09-30 MED ORDER — METHIMAZOLE 10 MG PO TABS: 10.0000 mg | ORAL_TABLET | Freq: Every day | ORAL | Status: DC

## 2010-09-30 NOTE — Patient Instructions (Addendum)
blood tests are being ordered for you today.  please call [phone] to hear your test results.  You will be prompted to enter the 9-digit "MRN" number that appears at the top left of this page, followed by #.  Then you will hear the message. pending the test results, please continue the same methimazole for now Please make a follow-up appointment in 3 months. if ever you have fever while taking this medication, stop it and call us, because of the risk of a rare side-effect. (update: i left message on phone-tree:  Increase tapazole back to 10/d).

## 2010-09-30 NOTE — Progress Notes (Signed)
  Subjective:    Patient ID: Tommy Browning, male    DOB: December 22, 1929, 77 y.o.   MRN: [id number]  HPI Pt ret for f/u of hyperthyroidism. He chose thionamide rx. He states 1 month of slight pressure-sensation at both eyes.   Past Medical History  Diagnosis Date  . BACK PAIN 02/24/2008  . HYPERTHYROIDISM 12/08/2006  . HYPOGONADISM 02/03/2010  . HYPERLIPIDEMIA, MILD 05/30/2009  . HYPERTROPHY PROSTATE W/UR OBST & OTH LUTS 02/26/2009  . ERECTILE DYSFUNCTION, ORGANIC 05/30/2009  . ELECTROCARDIOGRAM, ABNORMAL 12/08/2006    F/U by a normal stress test on September 2005  . COLONOSCOPY, HX OF 12/28/2005  . History of thyroid nodule     Resolved by [place] 11/2006    No past surgical history on file.  History   Social History  . Marital Status: Married    Spouse Name: N/A    Number of Children: 2  . Years of Education: N/A   Occupational History  .      Pastor   Social History Main Topics  . Smoking status: Former Smoker    Types: Cigarettes  . Smokeless tobacco: Not on file   Comment: 1-2 cigarettes per day  . Alcohol Use: Yes     wine socially  . Drug Use: Not on file  . Sexually Active: Not on file     casual smoker   Other Topics Concern  . 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 Multiple Vitamin (MULTIVITAMIN) tablet Take 1 tablet by mouth daily.        . Testosterone (AXIRON) 30 MG/ACT SOLN Use 1 pump in each axilla once daily         No Known Allergies  Family History  Problem Relation Age of Onset  . Cancer Sister     Colon Cancer  . Diabetes Neg Hx   . Thyroid disease Neg Hx     no goiter or other thyroid problem  . Stroke Other     GP    BP 104/74  Pulse 85  Temp(Src) 98.4 F (36.9 C) (Oral)  Ht 5\' 10"  (1.778 m)  Wt 159 lb 3.2 oz (72.213 kg)  BMI 22.84 kg/m2  SpO2 98%  Review of Systems Denies fever     Objective:   Physical Exam GENERAL: no distress Eyes:  Slight periorbital swelling Neck: i do not appreciate a goiter.    Lab Results  Component Value Date   TSH 0.06* 09/30/2010   Assessment & Plan:  Hyperthyroidism, needs increased rx

## 2010-10-08 ENCOUNTER — Encounter: Payer: Self-pay | Admitting: Internal Medicine

## 2010-10-08 ENCOUNTER — Ambulatory Visit (INDEPENDENT_AMBULATORY_CARE_PROVIDER_SITE_OTHER): Payer: Medicare Other | Admitting: Internal Medicine

## 2010-10-08 DIAGNOSIS — E291 Testicular hypofunction: Secondary | ICD-10-CM

## 2010-10-08 DIAGNOSIS — H532 Diplopia: Secondary | ICD-10-CM

## 2010-10-08 NOTE — Assessment & Plan Note (Signed)
Ongoing symptoms, unclear etiology, recent MRI normal. Refer to neurology

## 2010-10-08 NOTE — Assessment & Plan Note (Signed)
Monitored by urology,   they discontinue testosterone and is currently on clomiphene

## 2010-10-08 NOTE — Progress Notes (Signed)
  Subjective:    Patient ID: Tommy Browning, male    DOB: September 06, 1929, 77 y.o.   MRN: [id number]  HPI Since the last office visit, diplopia  continue. It is worse early in the morning, not worse when he feels tired Has also noted bag under his eyes, worse in the right   Past Medical History  Diagnosis Date  . BACK PAIN 02/24/2008  . HYPERTHYROIDISM 12/08/2006  . HYPOGONADISM 02/03/2010  . HYPERLIPIDEMIA, MILD 05/30/2009  . HYPERTROPHY PROSTATE W/UR OBST & OTH LUTS 02/26/2009  . ERECTILE DYSFUNCTION, ORGANIC 05/30/2009  . ELECTROCARDIOGRAM, ABNORMAL 12/08/2006    F/U by a normal stress test on September 2005  . COLONOSCOPY, HX OF 12/28/2005  . History of thyroid nodule     Resolved by [place] 11/2006    No past surgical history on file.   Review of Systems No headaches, slurred speech or syncope No dizziness or apparent problems with memory. We reviewed his medication list, no medication was started prior to the onset of diplopia. Actually at this point he is taking clomiphene instead of testosterone as prescribed by his urologist (he ran out of clomiphene for  about a month but the diplopia did not get better)    Objective:   Physical Exam  Constitutional: He is oriented to person, place, and time. He appears well-developed and well-nourished.       He does have bags under his eyes, looks tired.  Neurological: He is alert and oriented to person, place, and time.       Pupils equal and reactive, extraocular movements intact, no nystagmus. Face symmetric. Motor and reflexes symmetric as well.           Assessment & Plan:

## 2010-10-15 ENCOUNTER — Telehealth: Payer: Self-pay | Admitting: Internal Medicine

## 2010-10-15 NOTE — Telephone Encounter (Signed)
Pt saw Dr. Everardo All at [place] earlier this month. Ok to make referral based on that visit? Please advise.

## 2010-10-21 NOTE — Telephone Encounter (Signed)
Recommend him to call Dr. Everardo All,  see if he recommends a referral

## 2010-10-21 NOTE — Telephone Encounter (Signed)
Pt is aware.  

## 2010-10-24 ENCOUNTER — Ambulatory Visit (INDEPENDENT_AMBULATORY_CARE_PROVIDER_SITE_OTHER): Payer: Medicare Other | Admitting: Internal Medicine

## 2010-10-24 ENCOUNTER — Encounter: Payer: Self-pay | Admitting: Internal Medicine

## 2010-10-24 ENCOUNTER — Other Ambulatory Visit: Payer: Self-pay | Admitting: Internal Medicine

## 2010-10-24 ENCOUNTER — Ambulatory Visit (INDEPENDENT_AMBULATORY_CARE_PROVIDER_SITE_OTHER)
Admission: RE | Admit: 2010-10-24 | Discharge: 2010-10-24 | Disposition: A | Discharge status: Home / Self Care | Payer: Medicare Other | Source: Ambulatory Visit | Attending: Internal Medicine | Admitting: Internal Medicine

## 2010-10-24 VITALS — BP 120/86 | HR 99 | Wt 158.4 lb

## 2010-10-24 DIAGNOSIS — M549 Dorsalgia, unspecified: Secondary | ICD-10-CM

## 2010-10-24 DIAGNOSIS — E0590 Thyrotoxicosis, unspecified without thyrotoxic crisis or storm: Secondary | ICD-10-CM

## 2010-10-24 DIAGNOSIS — H532 Diplopia: Secondary | ICD-10-CM

## 2010-10-24 LAB — POCT URINALYSIS DIPSTICK
Blood, UA: NEGATIVE
Glucose, UA: NEGATIVE
Spec Grav, UA: 1.02
Urobilinogen, UA: 0.2
pH, UA: 6

## 2010-10-24 MED ORDER — CYCLOBENZAPRINE HCL 10 MG PO TABS: 10.0000 mg | ORAL_TABLET | Freq: Two times a day (BID) | ORAL | Status: AC | PRN

## 2010-10-24 NOTE — Assessment & Plan Note (Addendum)
Insist on getting a "second opinion" from endocrinology at [facility], although I don't think is neccessary will go ahead and refer him

## 2010-10-24 NOTE — Assessment & Plan Note (Signed)
Has not hear from neurology, will try again to get a prompt referral

## 2010-10-24 NOTE — Progress Notes (Signed)
  Subjective:    Patient ID: Tommy Browning, male    DOB: 06-Dec-1929, 77 y.o.   MRN: [id number]  HPI Chief complaint is back ache. He has a long history of on and off back pain ;symptoms exacerbated  a week ago, located at the mid thoracic area bilaterally, worse by moving or standing, better by sitting Pain somehow radiates anteriorly to the abdomen ( left and right), no recent injury.  Past Medical History  Diagnosis Date  . BACK PAIN 02/24/2008  . HYPERTHYROIDISM 12/08/2006  . HYPOGONADISM 02/03/2010  . HYPERLIPIDEMIA, MILD 05/30/2009  . HYPERTROPHY PROSTATE W/UR OBST & OTH LUTS 02/26/2009  . ERECTILE DYSFUNCTION, ORGANIC 05/30/2009  . ELECTROCARDIOGRAM, ABNORMAL 12/08/2006    F/U by a normal stress test on September 2005  . COLONOSCOPY, HX OF 12/28/2005  . History of thyroid nodule     Resolved by [place] 11/2006   No past surgical history on file.   Review of Systems No fever or weight loss. No rash No postprandial abdominal pain nausea or vomiting. No known extremity paresthesias. No dysuria gross hematuria    Objective:   Physical Exam  Constitutional: He is oriented to person, place, and time. He appears well-developed and well-nourished.  Abdominal: Soft. Bowel sounds are normal. He exhibits no distension. There is no tenderness. There is no rebound and no guarding.       No CVA tenderness  Musculoskeletal: He exhibits no edema.       Rotation of the hips causes no pain  Neurological: He is alert and oriented to person, place, and time.       Gait normal, motor and DTRs symmetric. Straight leg test negative.  Skin: Skin is warm and dry.       No rash in the back or abdomen  Psychiatric: He has a normal mood and affect. His behavior is normal. Thought content normal.          Assessment & Plan:   BACK PAIN On-off back pain for years, on chart review as far back as 2009. His pain is and they need to thoracic area and right T8 anteriorly both the left than the right. GI  review of systems is negative, urinalysis negative, he has no rash  Symptoms are most likely musculoskeletal. Plan: T-spine x-ray Tylenol and a muscle relaxant. If no  better, PT? Orthopedic surgery?   Diplopia Has not hear from neurology, will try again to get a prompt referral

## 2010-10-24 NOTE — Assessment & Plan Note (Addendum)
On-off back pain for years, on chart review as far back as 2009. His pain is at the mid thoracic area ~T8 and radiates  both to the left and right. GI review of systems is negative, urinalysis negative, he has no rash  Symptoms are most likely musculoskeletal. Plan: T-spine x-ray Tylenol and a muscle relaxant. If no  better, PT? Orthopedic surgery?

## 2010-10-24 NOTE — Patient Instructions (Signed)
Warm compress Tylenol 500 mg 2 tablets every 6 hours as needed for pain. Flexeril twice a day, this is a muscle relaxant, will  cause drowsiness. X-ray Call if not better in 2 or 3 weeks

## 2010-10-25 ENCOUNTER — Telehealth: Payer: Self-pay | Admitting: Internal Medicine

## 2010-10-25 NOTE — Telephone Encounter (Signed)
Was referred to neuro, has not hear from them, please follow up on referral, needs to be seen if possible within 10 days. [place] or [place] ok

## 2010-10-27 NOTE — Telephone Encounter (Signed)
Per Graciella Belton of [facility], patient has appt with Dr. Marjory Lies for August and the patient has been made aware.  She will call the patient for an asap appt if she has any cancellations.

## 2010-10-27 NOTE — Telephone Encounter (Signed)
THX!

## 2010-10-28 ENCOUNTER — Telehealth: Payer: Self-pay | Admitting: *Deleted

## 2010-10-28 NOTE — Telephone Encounter (Signed)
Message left for patient to return my call.  

## 2010-10-28 NOTE — Telephone Encounter (Signed)
Message copied by Leanne Lovely on Tue Oct 28, 2010 10:25 AM ------      Message from: Tommy Browning      Created: Tue Oct 28, 2010  8:15 AM       XRs ok

## 2010-10-29 NOTE — Telephone Encounter (Signed)
Message left for patient to return my call.  

## 2010-10-29 NOTE — Telephone Encounter (Signed)
Pt is aware.  

## 2010-11-10 ENCOUNTER — Other Ambulatory Visit: Payer: Self-pay | Admitting: Neurology

## 2010-11-10 DIAGNOSIS — E0590 Thyrotoxicosis, unspecified without thyrotoxic crisis or storm: Secondary | ICD-10-CM

## 2010-11-10 DIAGNOSIS — I679 Cerebrovascular disease, unspecified: Secondary | ICD-10-CM

## 2010-11-10 DIAGNOSIS — H532 Diplopia: Secondary | ICD-10-CM

## 2010-11-13 ENCOUNTER — Ambulatory Visit
Admission: RE | Admit: 2010-11-13 | Discharge: 2010-11-13 | Disposition: A | Discharge status: Home / Self Care | Payer: Medicare Other | Source: Ambulatory Visit | Attending: Neurology | Admitting: Neurology

## 2010-11-13 DIAGNOSIS — H532 Diplopia: Secondary | ICD-10-CM

## 2010-11-13 DIAGNOSIS — E0590 Thyrotoxicosis, unspecified without thyrotoxic crisis or storm: Secondary | ICD-10-CM

## 2010-11-13 DIAGNOSIS — I679 Cerebrovascular disease, unspecified: Secondary | ICD-10-CM

## 2010-11-13 MED ORDER — GADOBENATE DIMEGLUMINE 529 MG/ML IV SOLN
14.0000 mL | Freq: Once | INTRAVENOUS | Status: AC | PRN
  Administered 2010-11-13: 14 mL via INTRAVENOUS

## 2010-11-24 ENCOUNTER — Other Ambulatory Visit: Payer: Self-pay | Admitting: Endocrinology

## 2010-12-11 ENCOUNTER — Ambulatory Visit: Payer: Medicare Other | Admitting: Endocrinology

## 2010-12-12 ENCOUNTER — Ambulatory Visit: Payer: Medicare Other | Admitting: Endocrinology

## 2010-12-15 ENCOUNTER — Encounter: Payer: Self-pay | Admitting: Endocrinology

## 2010-12-15 ENCOUNTER — Ambulatory Visit (INDEPENDENT_AMBULATORY_CARE_PROVIDER_SITE_OTHER): Payer: Medicare Other | Admitting: Endocrinology

## 2010-12-15 ENCOUNTER — Other Ambulatory Visit: Payer: Self-pay | Admitting: Endocrinology

## 2010-12-15 VITALS — BP 106/74 | HR 105 | Temp 98.0°F | Ht 70.0 in | Wt 157.6 lb

## 2010-12-15 DIAGNOSIS — E0590 Thyrotoxicosis, unspecified without thyrotoxic crisis or storm: Secondary | ICD-10-CM

## 2010-12-15 LAB — TSH: TSH: 0.14 u[IU]/mL — ABNORMAL LOW (ref 0.35–5.50)

## 2010-12-15 MED ORDER — METHIMAZOLE 10 MG PO TABS: 10.0000 mg | ORAL_TABLET | Freq: Two times a day (BID) | ORAL | Status: DC

## 2010-12-15 NOTE — Progress Notes (Signed)
  Subjective:    Patient ID: Tommy Browning, male    DOB: [birth year], 77 y.o.   MRN: [id number]  HPI Pt ret for f/u of hyperthyroidism, which was dx'ed in 2011. He chose thionamide rx.  He increased to 10 mg qd, as advised. He says again today that he would like to continue the same, rather than i-131 rx.  He states 1 month of slight pressure-sensation at both eyes.   Past Medical History  Diagnosis Date  . BACK PAIN 02/24/2008  . HYPERTHYROIDISM 12/08/2006  . HYPOGONADISM 02/03/2010  . HYPERLIPIDEMIA, MILD 05/30/2009  . HYPERTROPHY PROSTATE W/UR OBST & OTH LUTS 02/26/2009  . ERECTILE DYSFUNCTION, ORGANIC 05/30/2009  . ELECTROCARDIOGRAM, ABNORMAL 12/08/2006    F/U by a normal stress test on September 2005  . COLONOSCOPY, HX OF 12/28/2005  . History of thyroid nodule     Resolved by [place] 11/2006    No past surgical history on file.  History   Social History  . Marital Status: Married    Spouse Name: N/A    Number of Children: 2  . Years of Education: N/A   Occupational History  .      Pastor   Social History Main Topics  . Smoking status: Former Smoker    Types: Cigarettes  . Smokeless tobacco: Not on file   Comment: 1-2 cigarettes per day  . Alcohol Use: Yes     wine socially  . Drug Use: Not on file  . Sexually Active: Not on file     casual smoker   Other Topics Concern  . 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 methimazole (TAPAZOLE) 10 MG tablet Take 1 tablet (10 mg total) by mouth daily.  30 tablet  2  . Multiple Vitamin (MULTIVITAMIN) tablet Take 1 tablet by mouth daily.          No Known Allergies  Family History  Problem Relation Age of Onset  . Cancer Sister     Colon Cancer  . Diabetes Neg Hx   . Thyroid disease Neg Hx     no goiter or other thyroid problem  . Stroke Other     GP   BP 106/74  Pulse 105   Temp(Src) 98 F (36.7 C) (Oral)  Ht 5\' 10"  (1.778 m)  Wt 157 lb 9.6 oz (71.487 kg)  BMI 22.61 kg/m2  SpO2 95%  Review of Systems Denies fever.      Objective:   Physical Exam VITAL SIGNS:  See vs page GENERAL: no distress head: no deformity eyes: there is moderate periorbital swelling and proptosis external nose and ears are normal mouth: no lesion seen Neck: thyroid is 5x normal size, with slightly irreg surface, but no nodule    Assessment & Plan:  Grave's dz of the eyes.  There is no safe and effective rx for this Hyperthyroidism, now on increased tapazole.

## 2010-12-15 NOTE — Patient Instructions (Signed)
i left message on phone tree Increase tapazole to 10 mg bid Ret 6 weeks

## 2010-12-15 NOTE — Patient Instructions (Addendum)
blood tests are being ordered for you today.  please call [phone] to hear your test results.  You will be prompted to enter the 9-digit "MRN" number that appears at the top left of this page, followed by #.  Then you will hear the message. pending the test results, please continue the same methimazole for now Please make a follow-up appointment in 4 months. if ever you have fever while taking this medication, stop it and call us, because of the risk of a rare side-effect.

## 2011-01-09 ENCOUNTER — Ambulatory Visit: Payer: Medicare Other | Admitting: Internal Medicine

## 2011-01-16 ENCOUNTER — Ambulatory Visit: Payer: Medicare Other | Admitting: Internal Medicine

## 2011-01-22 ENCOUNTER — Ambulatory Visit (INDEPENDENT_AMBULATORY_CARE_PROVIDER_SITE_OTHER): Payer: Medicare Other | Admitting: Endocrinology

## 2011-01-22 DIAGNOSIS — E0590 Thyrotoxicosis, unspecified without thyrotoxic crisis or storm: Secondary | ICD-10-CM

## 2011-01-22 DIAGNOSIS — Z0289 Encounter for other administrative examinations: Secondary | ICD-10-CM

## 2011-01-24 NOTE — Progress Notes (Signed)
  Subjective:    Patient ID: Tommy Browning, male    DOB: 09-May-1929, 77 y.o.   MRN: [id number]  HPI    Review of Systems     Objective:   Physical Exam        Assessment & Plan:

## 2011-01-26 ENCOUNTER — Other Ambulatory Visit (INDEPENDENT_AMBULATORY_CARE_PROVIDER_SITE_OTHER): Payer: Medicare Other

## 2011-01-26 ENCOUNTER — Encounter: Payer: Self-pay | Admitting: Endocrinology

## 2011-01-26 ENCOUNTER — Ambulatory Visit (INDEPENDENT_AMBULATORY_CARE_PROVIDER_SITE_OTHER): Payer: Medicare Other | Admitting: Endocrinology

## 2011-01-26 VITALS — BP 112/82 | HR 70 | Temp 97.0°F | Ht 70.0 in | Wt 159.0 lb

## 2011-01-26 DIAGNOSIS — E0590 Thyrotoxicosis, unspecified without thyrotoxic crisis or storm: Secondary | ICD-10-CM

## 2011-01-26 DIAGNOSIS — Z23 Encounter for immunization: Secondary | ICD-10-CM

## 2011-01-26 NOTE — Progress Notes (Signed)
  Subjective:    Patient ID: Tommy Browning, male    DOB: 05-06-1929, 77 y.o.   MRN: [id number]  HPI Pt ret for f/u of hyperthyroidism, due to grave's dz.  He chose thionamide rx. He states persistent slight pressure-sensation at both eyes.  He is due to see opthal at [facility] in December.   Past Medical History  Diagnosis Date  . BACK PAIN 02/24/2008  . HYPERTHYROIDISM 12/08/2006  . HYPOGONADISM 02/03/2010  . HYPERLIPIDEMIA, MILD 05/30/2009  . HYPERTROPHY PROSTATE W/UR OBST & OTH LUTS 02/26/2009  . ERECTILE DYSFUNCTION, ORGANIC 05/30/2009  . ELECTROCARDIOGRAM, ABNORMAL 12/08/2006    F/U by a normal stress test on September 2005  . COLONOSCOPY, HX OF 12/28/2005  . History of thyroid nodule     Resolved by [place] 11/2006    No past surgical history on file.  History   Social History  . Marital Status: Married    Spouse Name: N/A    Number of Children: 2  . Years of Education: N/A   Occupational History  .      Pastor   Social History Main Topics  . Smoking status: Former Smoker    Types: Cigarettes  . Smokeless tobacco: Not on file   Comment: 1-2 cigarettes per day  . Alcohol Use: Yes     wine socially  . Drug Use: Not on file  . Sexually Active: Not on file     casual smoker   Other Topics Concern  . 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 methimazole (TAPAZOLE) 10 MG tablet Take 1 tablet (10 mg total) by mouth 2 (two) times daily.  60 tablet  2  . Multiple Vitamin (MULTIVITAMIN) tablet Take 1 tablet by mouth daily.          No Known Allergies  Family History  Problem Relation Age of Onset  . Cancer Sister     Colon Cancer  . Diabetes Neg Hx   . Thyroid disease Neg Hx     no goiter or other thyroid problem  . Stroke Other     GP    BP 112/82  Pulse 70  Temp(Src) 97 F (36.1 C) (Oral)  Ht 5\' 10"  (1.778 m)  Wt 159  lb 0.6 oz (72.14 kg)  BMI 22.82 kg/m2  SpO2 97%   Review of Systems Denies fever.    Objective:   Physical Exam VITAL SIGNS:  See vs page GENERAL: no distress head: no deformity eyes: there is periorbital swelling and proptosis, right > left NECK: There is no palpable thyroid enlargement.  No thyroid nodule is palpable.  No palpable lymphadenopathy at the anterior neck.     Assessment & Plan:  Hyperthyroidism due to grave's dz, clinically better.

## 2011-01-26 NOTE — Patient Instructions (Addendum)
blood tests are being ordered for you today.  please call [phone] to hear your test results.  You will be prompted to enter the 9-digit "MRN" number that appears at the top left of this page, followed by #.  Then you will hear the message. pending the test results, please continue the same methimazole for now Please make a follow-up appointment in 4 months. if ever you have fever while taking this medication, stop it and call us, because of the risk of a rare side-effect.    Hyperthyroidism The thyroid is a large gland located in the lower front part of your neck. The thyroid helps control metabolism. Metabolism is how your body uses food. It controls metabolism with the hormone thyroxine. When the thyroid is overactive, it produces too much hormone. When this happens, these following problems may occur:    Nervousness     Heat intolerance     Weight loss (in spite of increase food intake)     Diarrhea    Change in hair or skin texture     Palpitations (heart skipping or having extra beats)     Tachycardia (rapid heart rate)     Loss of menstruation (amenorrhea)     Shaking of the hands  CAUSES  Grave's Disease (the immune system attacks the thyroid gland). This is the most common cause, and it is what you have.     Inflammation of the thyroid gland.     Tumor (usually benign) in the thyroid gland or elsewhere.     Excessive use of thyroid medications (both prescription and 'natural').     Excessive ingestion of Iodine.  DIAGNOSIS   To prove hyperthyroidism, your caregiver may do blood tests and ultrasound tests. Sometimes the signs are hidden. It may be necessary for your caregiver to watch this illness with blood tests, either before or after diagnosis and treatment. TREATMENT Short-term treatment There are several treatments to control symptoms. Drugs called beta blockers may give some relief. Drugs that decrease hormone production will provide temporary relief in many  people. These measures will usually not give permanent relief. Definitive therapy There are treatments available which can be discussed between you and your caregiver which will permanently treat the problem. These treatments range from surgery (removal of the thyroid), to the use of radioactive iodine (destroys the thyroid by radiation), to the use of antithyroid drugs (interfere with hormone synthesis). The first two treatments are permanent and usually successful. They most often require hormone replacement therapy for life. This is because it is impossible to remove or destroy the exact amount of thyroid required to make a person euthyroid (normal). HOME CARE INSTRUCTIONS   See your caregiver if the problems you are being treated for get worse. Examples of this would be the problems listed above. SEEK MEDICAL CARE IF: Your general condition worsens. MAKE SURE YOU:    Understand these instructions.     Will watch your condition.     Will get help right away if you are not doing well or get worse.  Document Released: 03/16/2005 Document Revised: 11/26/2010 Document Reviewed: 07/28/2006 [facility] Patient Information 2012 [place], [place].

## 2011-01-27 ENCOUNTER — Ambulatory Visit (INDEPENDENT_AMBULATORY_CARE_PROVIDER_SITE_OTHER): Payer: Medicare Other | Admitting: Internal Medicine

## 2011-01-27 ENCOUNTER — Encounter: Payer: Self-pay | Admitting: Internal Medicine

## 2011-01-27 ENCOUNTER — Other Ambulatory Visit: Payer: Self-pay | Admitting: Endocrinology

## 2011-01-27 VITALS — BP 120/78 | HR 77 | Temp 98.5°F | Resp 18 | Ht 69.29 in | Wt 160.2 lb

## 2011-01-27 DIAGNOSIS — R634 Abnormal weight loss: Secondary | ICD-10-CM

## 2011-01-27 DIAGNOSIS — Z23 Encounter for immunization: Secondary | ICD-10-CM

## 2011-01-27 DIAGNOSIS — H532 Diplopia: Secondary | ICD-10-CM

## 2011-01-27 DIAGNOSIS — Z515 Encounter for palliative care: Secondary | ICD-10-CM | POA: Insufficient documentation

## 2011-01-27 DIAGNOSIS — Z0000 Encounter for general adult medical examination without abnormal findings: Secondary | ICD-10-CM | POA: Insufficient documentation

## 2011-01-27 DIAGNOSIS — H6120 Impacted cerumen, unspecified ear: Secondary | ICD-10-CM

## 2011-01-27 DIAGNOSIS — E0590 Thyrotoxicosis, unspecified without thyrotoxic crisis or storm: Secondary | ICD-10-CM

## 2011-01-27 MED ORDER — METHIMAZOLE 10 MG PO TABS: 10.0000 mg | ORAL_TABLET | Freq: Every day | ORAL | Status: DC

## 2011-01-27 NOTE — Assessment & Plan Note (Signed)
Improving, likely due to Graves' disease

## 2011-01-27 NOTE — Assessment & Plan Note (Signed)
Due  to Graves ophthalmopathy

## 2011-01-27 NOTE — Assessment & Plan Note (Signed)
Patient requests a flu shot today. Done Also, he had a colonoscopy in 2004 and October 2007, due for a repeat colonoscopy. Referral done. Complete physical exam more than a year ago, encouraged to schedule CPE

## 2011-01-27 NOTE — Assessment & Plan Note (Addendum)
Hyperthyroidism with Graves ophthalmopathy, monitored by endocrinology as well as ophthalmology Dr.Groat. Last visit with ophthalmology was 01/05/2011, following a CT of the orbits. He was referred to  [facility], he will be seen by December. He was recommended to go back to Dr. Dione Booze  by mid-November. I strongly encouraged him to do that

## 2011-01-27 NOTE — Progress Notes (Signed)
  Subjective:    Patient ID: Tommy Browning, male    DOB: Jul 07, 1929, 77 y.o.   MRN: [id number]  HPI Patient liked to update me on his hyperthyroidism problem. Chart was reviewed,  see assessment and plan Likes  a flu shot Would like his ears lavaged , they feel clogged up.  Past Medical History  Diagnosis Date  . BACK PAIN 02/24/2008  . HYPERTHYROIDISM 12/08/2006  . HYPOGONADISM 02/03/2010  . HYPERLIPIDEMIA, MILD 05/30/2009  . HYPERTROPHY PROSTATE W/UR OBST & OTH LUTS 02/26/2009  . ERECTILE DYSFUNCTION, ORGANIC 05/30/2009  . ELECTROCARDIOGRAM, ABNORMAL 12/08/2006    F/U by a normal stress test on September 2005  . COLONOSCOPY, HX OF 12/28/2005  . History of thyroid nodule     Resolved by [place] 11/2006   No past surgical history on file.    Review of Systems Good compliance with meds. Still has diplopia.    Objective:   Physical Exam  Constitutional: He appears well-developed and well-nourished.  HENT:  Head: Normocephalic and atraumatic.       Both ears with abundant wax  Eyes:       Exophthalmus noted, worse on the right.  Cardiovascular: Normal rate, regular rhythm and normal heart sounds.   No murmur heard. Pulmonary/Chest: Effort normal and breath sounds normal. No respiratory distress. He has no wheezes. He has no rales.          Assessment & Plan:  Cerumen impactation: S/p lavaged B, tolerated well, after lavage exam normal

## 2011-01-27 NOTE — Patient Instructions (Signed)
Came back within 2 months for a physical You need to be seen by Dr Dione Booze by mid November

## 2011-01-29 ENCOUNTER — Encounter: Payer: Self-pay | Admitting: Gastroenterology

## 2011-02-23 ENCOUNTER — Ambulatory Visit (AMBULATORY_SURGERY_CENTER): Payer: Medicare Other | Admitting: *Deleted

## 2011-02-23 VITALS — Ht 70.0 in | Wt 156.0 lb

## 2011-02-23 DIAGNOSIS — Z1211 Encounter for screening for malignant neoplasm of colon: Secondary | ICD-10-CM

## 2011-02-23 MED ORDER — PEG-KCL-NACL-NASULF-NA ASC-C 100 G PO SOLR: ORAL | Status: DC

## 2011-03-09 ENCOUNTER — Other Ambulatory Visit: Payer: Medicare Other | Admitting: Gastroenterology

## 2011-03-13 ENCOUNTER — Other Ambulatory Visit: Payer: Self-pay | Admitting: Endocrinology

## 2011-04-02 ENCOUNTER — Encounter: Payer: Medicare Other | Admitting: Internal Medicine

## 2011-04-07 DIAGNOSIS — H2510 Age-related nuclear cataract, unspecified eye: Secondary | ICD-10-CM | POA: Diagnosis not present

## 2011-04-07 DIAGNOSIS — H40059 Ocular hypertension, unspecified eye: Secondary | ICD-10-CM | POA: Diagnosis not present

## 2011-04-07 DIAGNOSIS — H40029 Open angle with borderline findings, high risk, unspecified eye: Secondary | ICD-10-CM | POA: Diagnosis not present

## 2011-04-07 DIAGNOSIS — H0230 Blepharochalasis unspecified eye, unspecified eyelid: Secondary | ICD-10-CM | POA: Diagnosis not present

## 2011-04-16 ENCOUNTER — Ambulatory Visit (INDEPENDENT_AMBULATORY_CARE_PROVIDER_SITE_OTHER): Payer: Medicare Other | Admitting: Endocrinology

## 2011-04-16 ENCOUNTER — Other Ambulatory Visit (INDEPENDENT_AMBULATORY_CARE_PROVIDER_SITE_OTHER): Payer: Medicare Other

## 2011-04-16 ENCOUNTER — Encounter: Payer: Self-pay | Admitting: Endocrinology

## 2011-04-16 VITALS — BP 112/82 | HR 72 | Temp 96.9°F | Ht 70.0 in | Wt 159.0 lb

## 2011-04-16 DIAGNOSIS — E0590 Thyrotoxicosis, unspecified without thyrotoxic crisis or storm: Secondary | ICD-10-CM

## 2011-04-16 DIAGNOSIS — E0500 Thyrotoxicosis with diffuse goiter without thyrotoxic crisis or storm: Secondary | ICD-10-CM | POA: Diagnosis not present

## 2011-04-16 LAB — TSH: TSH: 1.98 u[IU]/mL (ref 0.35–5.50)

## 2011-04-16 NOTE — Patient Instructions (Addendum)
blood tests are being ordered for you today.  please call [phone] to hear your test results.  You will be prompted to enter the 9-digit "MRN" number that appears at the top left of this page, followed by #.  Then you will hear the message. pending the test results, please continue the same methimazole for now Please make a follow-up appointment in 6 months. if ever you have fever while taking this medication, stop it and call us, because of the risk of a rare side-effect. (update: i left message on phone-tree:  rx as we discussed)

## 2011-04-16 NOTE — Progress Notes (Signed)
  Subjective:    Patient ID: Tommy Browning, male    DOB: 24-Dec-1929, 78 y.o.   MRN: [id number]  HPI Pt ret for f/u of hyperthyroidism (2011), due to grave's dz.  He chose thionamide rx. He states intermittent double vision (he sees opthal at [facility]) Past Medical History  Diagnosis Date  . BACK PAIN 02/24/2008  . HYPOGONADISM 02/03/2010  . HYPERLIPIDEMIA, MILD 05/30/2009  . HYPERTROPHY PROSTATE W/UR OBST & OTH LUTS 02/26/2009  . ERECTILE DYSFUNCTION, ORGANIC 05/30/2009  . ELECTROCARDIOGRAM, ABNORMAL 12/08/2006    F/U by a normal stress test on September 2005  . COLONOSCOPY, HX OF 12/28/2005  . History of thyroid nodule     Resolved by [place] 11/2006  . HYPERTHYROIDISM 12/08/2006    Graves  . Adenomatous colon polyp     No past surgical history on file.  History   Social History  . Marital Status: Married    Spouse Name: N/A    Number of Children: 2  . Years of Education: N/A   Occupational History  .      Pastor   Social History Main Topics  . Smoking status: Former Smoker    Types: Cigarettes  . Smokeless tobacco: Never Used   Comment: 1-2 cigarettes per day  . Alcohol Use: Yes     wine socially  . Drug Use: No  . Sexually Active: Not on file     casual smoker   Other Topics Concern  . 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 aspirin 81 MG tablet Take 81 mg by mouth daily.        . cyanocobalamin 1000 MCG tablet Take 100 mcg by mouth daily.        . fish oil-omega-3 fatty acids 1000 MG capsule Take 2 g by mouth daily.        . methimazole (TAPAZOLE) 10 MG tablet TAKE 1 TABLET (10 MG TOTAL) BY MOUTH 2 (TWO) TIMES DAILY.  60 tablet  2  . Multiple Vitamin (MULTIVITAMIN) tablet Take 1 tablet by mouth daily.        . peg 3350 powder (MOVIPREP) 100 G SOLR MOVI PREP take as directed  1 kit  0    No Known Allergies  Family  History  Problem Relation Age of Onset  . Cancer Sister     Colon Cancer  . Colon cancer Sister   . Diabetes Neg Hx   . Thyroid disease Neg Hx     no goiter or other thyroid problem  . Stroke Other     GP    BP 112/82  Pulse 72  Temp(Src) 96.9 F (36.1 C) (Oral)  Ht 5\' 10"  (1.778 m)  Wt 159 lb (72.122 kg)  BMI 22.81 kg/m2  SpO2 99%   Review of Systems Denies weight change    Objective:   Physical Exam VITAL SIGNS:  See vs page GENERAL: no distress head: no deformity eyes: no periorbital swelling, but there is bilat proptosis Neck:  Thyroid is 2x normal size on the right, and normal on the left.     Lab Results  Component Value Date   TSH 1.98 04/16/2011      Assessment & Plan:  Hyperthyroidism, well-controlled

## 2011-04-28 ENCOUNTER — Telehealth: Payer: Self-pay | Admitting: *Deleted

## 2011-04-28 NOTE — Telephone Encounter (Signed)
Pt left message requesting a callback. Left message for pt to callback office.

## 2011-04-29 NOTE — Telephone Encounter (Signed)
Blood test was normal.  Please continue same thyroid medication

## 2011-04-29 NOTE — Telephone Encounter (Signed)
Pt informed of MD's advisement. 

## 2011-04-29 NOTE — Telephone Encounter (Signed)
Pt called regarding results of latest TSH results. Pt informed of results. Pt wants MD's advisement on any medication changes per latest results.

## 2011-05-07 DIAGNOSIS — E291 Testicular hypofunction: Secondary | ICD-10-CM | POA: Diagnosis not present

## 2011-05-07 DIAGNOSIS — N401 Enlarged prostate with lower urinary tract symptoms: Secondary | ICD-10-CM | POA: Diagnosis not present

## 2011-05-14 DIAGNOSIS — E291 Testicular hypofunction: Secondary | ICD-10-CM | POA: Diagnosis not present

## 2011-05-14 DIAGNOSIS — N401 Enlarged prostate with lower urinary tract symptoms: Secondary | ICD-10-CM | POA: Diagnosis not present

## 2011-05-14 DIAGNOSIS — N529 Male erectile dysfunction, unspecified: Secondary | ICD-10-CM | POA: Diagnosis not present

## 2011-06-02 DIAGNOSIS — E0500 Thyrotoxicosis with diffuse goiter without thyrotoxic crisis or storm: Secondary | ICD-10-CM | POA: Diagnosis not present

## 2011-08-04 DIAGNOSIS — Z7982 Long term (current) use of aspirin: Secondary | ICD-10-CM | POA: Diagnosis not present

## 2011-08-04 DIAGNOSIS — G729 Myopathy, unspecified: Secondary | ICD-10-CM | POA: Diagnosis not present

## 2011-08-04 DIAGNOSIS — E0500 Thyrotoxicosis with diffuse goiter without thyrotoxic crisis or storm: Secondary | ICD-10-CM | POA: Diagnosis not present

## 2011-08-04 DIAGNOSIS — G7289 Other specified myopathies: Secondary | ICD-10-CM | POA: Diagnosis not present

## 2011-08-04 DIAGNOSIS — F17200 Nicotine dependence, unspecified, uncomplicated: Secondary | ICD-10-CM | POA: Diagnosis not present

## 2011-10-07 ENCOUNTER — Other Ambulatory Visit (INDEPENDENT_AMBULATORY_CARE_PROVIDER_SITE_OTHER): Payer: Medicare Other

## 2011-10-07 ENCOUNTER — Encounter: Payer: Self-pay | Admitting: Endocrinology

## 2011-10-07 ENCOUNTER — Ambulatory Visit (INDEPENDENT_AMBULATORY_CARE_PROVIDER_SITE_OTHER): Payer: Medicare Other | Admitting: Endocrinology

## 2011-10-07 VITALS — BP 122/80 | HR 72 | Temp 97.8°F | Ht 70.0 in | Wt 161.0 lb

## 2011-10-07 DIAGNOSIS — E0590 Thyrotoxicosis, unspecified without thyrotoxic crisis or storm: Secondary | ICD-10-CM

## 2011-10-07 LAB — TSH: TSH: 36.16 u[IU]/mL — ABNORMAL HIGH (ref 0.35–5.50)

## 2011-10-07 NOTE — Progress Notes (Signed)
  Subjective:    Patient ID: Tommy Browning, male    DOB: 17-Nov-1929, 78 y.o.   MRN: [id number]  HPI Pt ret for f/u of hyperthyroidism (2011), due to grave's dz.  He chose thionamide rx. He states intermittent double vision (he sees opthal at [facility]).   Past Medical History  Diagnosis Date  . BACK PAIN 02/24/2008  . HYPOGONADISM 02/03/2010  . HYPERLIPIDEMIA, MILD 05/30/2009  . HYPERTROPHY PROSTATE W/UR OBST & OTH LUTS 02/26/2009  . ERECTILE DYSFUNCTION, ORGANIC 05/30/2009  . ELECTROCARDIOGRAM, ABNORMAL 12/08/2006    F/U by a normal stress test on September 2005  . COLONOSCOPY, HX OF 12/28/2005  . History of thyroid nodule     Resolved by [place] 11/2006  . HYPERTHYROIDISM 12/08/2006    Graves  . Adenomatous colon polyp     No past surgical history on file.  History   Social History  . Marital Status: Married    Spouse Name: N/A    Number of Children: 2  . Years of Education: N/A   Occupational History  .      Pastor   Social History Main Topics  . Smoking status: Former Smoker    Types: Cigarettes  . Smokeless tobacco: Never Used   Comment: 1-2 cigarettes per day  . Alcohol Use: Yes     wine socially  . Drug Use: No  . Sexually Active: Not on file     casual smoker   Other Topics Concern  . 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 aspirin 81 MG tablet Take 81 mg by mouth daily.        . cyanocobalamin 1000 MCG tablet Take 100 mcg by mouth daily.        . fish oil-omega-3 fatty acids 1000 MG capsule Take 2 g by mouth daily.        . Multiple Vitamin (MULTIVITAMIN) tablet Take 1 tablet by mouth daily.          No Known Allergies  Family History  Problem Relation Age of Onset  . Cancer Sister     Colon Cancer  . Colon cancer Sister   . Diabetes Neg Hx   . Thyroid disease Neg Hx     no goiter or other thyroid problem  .  Stroke Other     GP    BP 122/80  Pulse 72  Temp 97.8 F (36.6 C) (Oral)  Ht 5\' 10"  (1.778 m)  Wt 161 lb (73.029 kg)  BMI 23.10 kg/m2  SpO2 97%  Review of Systems Denies fever    Objective:   Physical Exam head: no deformity eyes: no periorbital swelling, but there is bilat proptosis external nose and ears are normal mouth: no lesion seen NECK: There is no palpable thyroid enlargement.  No thyroid nodule is palpable.  No palpable lymphadenopathy at the anterior neck.  Lab Results  Component Value Date   TSH 36.16* 10/07/2011      Assessment & Plan:  Hyperthyroidism.  overcontrolled.

## 2011-10-07 NOTE — Patient Instructions (Addendum)
blood tests are being requested for you today.  You will receive a letter with results. pending the test results, please continue the same methimazole for now. Please make a follow-up appointment in 6 months.  if ever you have fever while taking this medication, stop it and call us, because of the risk of a rare side-effect.

## 2011-10-14 DIAGNOSIS — H532 Diplopia: Secondary | ICD-10-CM | POA: Diagnosis not present

## 2011-10-14 DIAGNOSIS — H40029 Open angle with borderline findings, high risk, unspecified eye: Secondary | ICD-10-CM | POA: Diagnosis not present

## 2011-10-14 DIAGNOSIS — H2510 Age-related nuclear cataract, unspecified eye: Secondary | ICD-10-CM | POA: Diagnosis not present

## 2011-10-15 ENCOUNTER — Ambulatory Visit: Payer: Medicare Other | Admitting: Endocrinology

## 2011-10-22 ENCOUNTER — Encounter: Payer: Self-pay | Admitting: Endocrinology

## 2011-10-22 ENCOUNTER — Other Ambulatory Visit (INDEPENDENT_AMBULATORY_CARE_PROVIDER_SITE_OTHER): Payer: Medicare Other

## 2011-10-22 ENCOUNTER — Ambulatory Visit (INDEPENDENT_AMBULATORY_CARE_PROVIDER_SITE_OTHER): Payer: Medicare Other | Admitting: Endocrinology

## 2011-10-22 VITALS — BP 122/88 | HR 74 | Temp 97.3°F | Wt 159.0 lb

## 2011-10-22 DIAGNOSIS — E0590 Thyrotoxicosis, unspecified without thyrotoxic crisis or storm: Secondary | ICD-10-CM

## 2011-10-22 LAB — TSH: TSH: 1.54 u[IU]/mL (ref 0.35–5.50)

## 2011-10-22 NOTE — Progress Notes (Signed)
  Subjective:    Patient ID: Tommy Browning, male    DOB: 1929-06-13, 78 y.o.   MRN: [id number]  HPI Pt ret for f/u of hyperthyroidism (2011), due to grave's dz.  He chose thionamide rx.  He was taking tapazole 10 mg qd, but tsh was high on this, so it was stopped.  Since then, pt states he feels well in general, except for fatigue. Past Medical History  Diagnosis Date  . BACK PAIN 02/24/2008  . HYPOGONADISM 02/03/2010  . HYPERLIPIDEMIA, MILD 05/30/2009  . HYPERTROPHY PROSTATE W/UR OBST & OTH LUTS 02/26/2009  . ERECTILE DYSFUNCTION, ORGANIC 05/30/2009  . ELECTROCARDIOGRAM, ABNORMAL 12/08/2006    F/U by a normal stress test on September 2005  . COLONOSCOPY, HX OF 12/28/2005  . History of thyroid nodule     Resolved by [place] 11/2006  . HYPERTHYROIDISM 12/08/2006    Graves  . Adenomatous colon polyp     No past surgical history on file.  History   Social History  . Marital Status: Married    Spouse Name: N/A    Number of Children: 2  . Years of Education: N/A   Occupational History  .      Pastor   Social History Main Topics  . Smoking status: Former Smoker    Types: Cigarettes  . Smokeless tobacco: Never Used   Comment: 1-2 cigarettes per day  . Alcohol Use: Yes     wine socially  . Drug Use: No  . Sexually Active: Not on file     casual smoker   Other Topics Concern  . 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 aspirin 81 MG tablet Take 81 mg by mouth daily.        . cyanocobalamin 1000 MCG tablet Take 100 mcg by mouth daily.        . fish oil-omega-3 fatty acids 1000 MG capsule Take 2 g by mouth daily.        . Multiple Vitamin (MULTIVITAMIN) tablet Take 1 tablet by mouth daily.          No Known Allergies  Family History  Problem Relation Age of Onset  . Cancer Sister     Colon Cancer  . Colon cancer Sister   . Diabetes  Neg Hx   . Thyroid disease Neg Hx     no goiter or other thyroid problem  . Stroke Other     GP    BP 122/88  Pulse 74  Temp 97.3 F (36.3 C) (Oral)  Wt 159 lb (72.122 kg)  SpO2 99%    Review of Systems Denies fever.      Objective:   Physical Exam VITAL SIGNS:  See vs page GENERAL: no distress NECK: There is no palpable thyroid enlargement.  No thyroid nodule is palpable.  No palpable lymphadenopathy at the anterior neck.     Lab Results  Component Value Date   TSH 1.54 10/22/2011      Assessment & Plan:  Hyperthyroidism, no longer overcontrolled.  He will probably need resumption of medication at a lower dosage soon.

## 2011-10-22 NOTE — Patient Instructions (Addendum)
blood tests are being requested for you today.  You will receive a letter with results. Our plan will be for the thyroid to come back up to normal, then resume the methimazole at a lower amount. Please come back for a follow-up appointment for 1 month.

## 2011-11-10 DIAGNOSIS — E0500 Thyrotoxicosis with diffuse goiter without thyrotoxic crisis or storm: Secondary | ICD-10-CM | POA: Diagnosis not present

## 2011-11-16 DIAGNOSIS — N401 Enlarged prostate with lower urinary tract symptoms: Secondary | ICD-10-CM | POA: Diagnosis not present

## 2011-11-16 DIAGNOSIS — N529 Male erectile dysfunction, unspecified: Secondary | ICD-10-CM | POA: Diagnosis not present

## 2011-11-16 DIAGNOSIS — E291 Testicular hypofunction: Secondary | ICD-10-CM | POA: Diagnosis not present

## 2011-12-03 ENCOUNTER — Ambulatory Visit (AMBULATORY_SURGERY_CENTER): Payer: Medicare Other | Admitting: *Deleted

## 2011-12-03 VITALS — Ht 70.0 in | Wt 153.2 lb

## 2011-12-03 DIAGNOSIS — Z1211 Encounter for screening for malignant neoplasm of colon: Secondary | ICD-10-CM

## 2011-12-09 ENCOUNTER — Other Ambulatory Visit: Payer: Self-pay | Admitting: Gastroenterology

## 2011-12-09 ENCOUNTER — Ambulatory Visit (AMBULATORY_SURGERY_CENTER): Payer: Medicare Other | Admitting: Gastroenterology

## 2011-12-09 ENCOUNTER — Encounter: Payer: Self-pay | Admitting: Gastroenterology

## 2011-12-09 VITALS — BP 138/95 | HR 68 | Temp 97.5°F | Resp 20 | Ht 70.0 in | Wt 153.0 lb

## 2011-12-09 DIAGNOSIS — E079 Disorder of thyroid, unspecified: Secondary | ICD-10-CM | POA: Diagnosis not present

## 2011-12-09 DIAGNOSIS — Z86010 Personal history of colonic polyps: Secondary | ICD-10-CM | POA: Diagnosis not present

## 2011-12-09 DIAGNOSIS — D126 Benign neoplasm of colon, unspecified: Secondary | ICD-10-CM | POA: Diagnosis not present

## 2011-12-09 DIAGNOSIS — Z800 Family history of malignant neoplasm of digestive organs: Secondary | ICD-10-CM | POA: Diagnosis not present

## 2011-12-09 DIAGNOSIS — Z1211 Encounter for screening for malignant neoplasm of colon: Secondary | ICD-10-CM

## 2011-12-09 DIAGNOSIS — E785 Hyperlipidemia, unspecified: Secondary | ICD-10-CM | POA: Diagnosis not present

## 2011-12-09 MED ORDER — SODIUM CHLORIDE 0.9 % IV SOLN: 500.0000 mL | INTRAVENOUS | Status: DC

## 2011-12-09 NOTE — Op Note (Addendum)
[facility] [address] [place], [address]   COLONOSCOPY PROCEDURE REPORT  PATIENT: Tommy Browning, Tommy Browning  MR#: [id number] BIRTHDATE: Apr 05, 1929 , 82  yrs. old GENDER: Male ENDOSCOPIST: Meryl Dare, MD, [facility] REFERRED BY: PROCEDURE DATE:  12/09/2011 PROCEDURE:   Colonoscopy with snare polypectomy ASA CLASS:   Class II INDICATIONS:patient's personal history of adenomatous colon polyps, TVA, 2004 and family history of colon cancer: sister. MEDICATIONS: MAC sedation, administered by CRNA and propofol (Diprivan) 130mg  IV DESCRIPTION OF PROCEDURE:   After the risks benefits and alternatives of the procedure were thoroughly explained, informed consent was obtained.  A digital rectal exam revealed no abnormalities of the rectum.   The LB [id number]  endoscope was introduced through the anus and advanced to the cecum, which was identified by both the appendix and ileocecal valve. No adverse events experienced.   The quality of the prep was good, using MoviPrep  The instrument was then slowly withdrawn as the colon was fully examined.   COLON FINDINGS: Two sessile polyps measuring 5-7 mm in size were found in the transverse colon.  A polypectomy was performed with a cold snare.  The resection was complete and the polyp tissue was completely retrieved.   Mild diverticulosis was noted in the sigmoid colon.   The colon was otherwise normal.  There was no diverticulosis, inflammation, polyps or cancers unless previously stated.  Retroflexed views revealed small internal hemorrhoids. The time to cecum=2 minutes 51 seconds.  Withdrawal time=8 minutes 55 seconds.  The scope was withdrawn and the procedure completed. COMPLICATIONS: There were no complications.  ENDOSCOPIC IMPRESSION: 1.   Two sessile polyps were found in the transverse colon; polypectomy was performed with a cold snare 2.   Mild diverticulosis was noted in the sigmoid colon 3.   Small internal  hemorrhoids  RECOMMENDATIONS: 1.  Await pathology results 2.  High fiber diet with liberal fluid intake. 3.  Given your age, you will not need another colonoscopy for colon cancer screening or polyp surveillance.  These types of tests usually stop around the age 47.  eSigned:  Meryl Dare, MD, [facility] 12/09/2011 3:26 PM Revised: 12/09/2011 3:26 PM

## 2011-12-09 NOTE — Patient Instructions (Signed)
HIGH FIBER DIET WITH LIBERAL FLUID INTAKE.     YOU HAD AN ENDOSCOPIC PROCEDURE TODAY AT THE [facility]: Refer to the procedure report that was given to you for any specific questions about what was found during the examination.  If the procedure report does not answer your questions, please call your gastroenterologist to clarify.  If you requested that your care partner not be given the details of your procedure findings, then the procedure report has been included in a sealed envelope for you to review at your convenience later.  YOU SHOULD EXPECT: Some feelings of bloating in the abdomen. Passage of more gas than usual.  Walking can help get rid of the air that was put into your GI tract during the procedure and reduce the bloating. If you had a lower endoscopy (such as a colonoscopy or flexible sigmoidoscopy) you may notice spotting of blood in your stool or on the toilet paper. If you underwent a bowel prep for your procedure, then you may not have a normal bowel movement for a few days.  DIET: Your first meal following the procedure should be a light meal and then it is ok to progress to your normal diet.  A half-sandwich or bowl of soup is an example of a good first meal.  Heavy or fried foods are harder to digest and may make you feel nauseous or bloated.  Likewise meals heavy in dairy and vegetables can cause extra gas to form and this can also increase the bloating.  Drink plenty of fluids but you should avoid alcoholic beverages for 24 hours.  ACTIVITY: Your care partner should take you home directly after the procedure.  You should plan to take it easy, moving slowly for the rest of the day.  You can resume normal activity the day after the procedure however you should NOT DRIVE or use heavy machinery for 24 hours (because of the sedation medicines used during the test).    SYMPTOMS TO REPORT IMMEDIATELY: A gastroenterologist can be reached at any hour.  During normal business  hours, 8:30 AM to 5:00 PM Monday through Friday, call [phone].  After hours and on weekends, please call the GI answering service at [phone] who will take a message and have the physician on call contact you.   Following lower endoscopy (colonoscopy or flexible sigmoidoscopy):  Excessive amounts of blood in the stool  Significant tenderness or worsening of abdominal pains  Swelling of the abdomen that is new, acute  Fever of 100F or higher  Following upper endoscopy (EGD)  Vomiting of blood or coffee ground material  New chest pain or pain under the shoulder blades  Painful or persistently difficult swallowing  New shortness of breath  Fever of 100F or higher  Black, tarry-looking stools  FOLLOW UP: If any biopsies were taken you will be contacted by phone or by letter within the next 1-3 weeks.  Call your gastroenterologist if you have not heard about the biopsies in 3 weeks.  Our staff will call the home number listed on your records the next business day following your procedure to check on you and address any questions or concerns that you may have at that time regarding the information given to you following your procedure. This is a courtesy call and so if there is no answer at the home number and we have not heard from you through the emergency physician on call, we will assume that you have returned to your  regular daily activities without incident.  SIGNATURES/CONFIDENTIALITY: You and/or your care partner have signed paperwork which will be entered into your electronic medical record.  These signatures attest to the fact that that the information above on your After Visit Summary has been reviewed and is understood.  Full responsibility of the confidentiality of this discharge information lies with you and/or your care-partner. 

## 2011-12-09 NOTE — Progress Notes (Signed)
Patient did not experience any of the following events: a burn prior to discharge; a fall within the facility; wrong site/side/patient/procedure/implant event; or a hospital transfer or hospital admission upon discharge from the facility. (G8907) Patient did not have preoperative order for IV antibiotic SSI prophylaxis. (G8918)  

## 2011-12-10 ENCOUNTER — Telehealth: Payer: Self-pay | Admitting: *Deleted

## 2011-12-10 NOTE — Telephone Encounter (Signed)
  Follow up Call-  Call back number 12/09/2011  Post procedure Call Back phone  # [phone]  Permission to leave phone message Yes     Patient questions:  Do you have a fever, pain , or abdominal swelling? no Pain Score  0 *  Have you tolerated food without any problems? yes  Have you been able to return to your normal activities? yes  Do you have any questions about your discharge instructions: Diet   no Medications  no Follow up visit  no  Do you have questions or concerns about your Care? no  Actions: * If pain score is 4 or above: No action needed, pain <4.

## 2011-12-14 ENCOUNTER — Encounter: Payer: Self-pay | Admitting: Gastroenterology

## 2011-12-17 DIAGNOSIS — J018 Other acute sinusitis: Secondary | ICD-10-CM | POA: Diagnosis not present

## 2012-01-01 ENCOUNTER — Ambulatory Visit (INDEPENDENT_AMBULATORY_CARE_PROVIDER_SITE_OTHER): Payer: Medicare Other | Admitting: Endocrinology

## 2012-01-01 ENCOUNTER — Encounter: Payer: Self-pay | Admitting: Endocrinology

## 2012-01-01 VITALS — BP 112/78 | HR 82 | Temp 97.8°F | Resp 16 | Wt 149.2 lb

## 2012-01-01 DIAGNOSIS — E0590 Thyrotoxicosis, unspecified without thyrotoxic crisis or storm: Secondary | ICD-10-CM | POA: Diagnosis not present

## 2012-01-01 NOTE — Patient Instructions (Addendum)
blood tests are being requested for you today.  You will be contacted with results.   Our plan will be for the thyroid to go overactive again, then resume the methimazole at a lower amount. Please come back for a follow-up appointment in 1 month.

## 2012-01-01 NOTE — Progress Notes (Signed)
  Subjective:    Patient ID: Tommy Browning, male    DOB: Jul 25, 1929, 78 y.o.   MRN: [id number]  HPI Pt returns for f/u of hyperthyroidism (dx'ed 2011), due to grave's dz.  He chose thionamide rx.  He was taking tapazole 10 mg qd, but tsh was high on this, so it was stopped.  Since then, pt states he feels well in general, except for weight loss.   Past Medical History  Diagnosis Date  . BACK PAIN 02/24/2008  . HYPOGONADISM 02/03/2010  . HYPERLIPIDEMIA, MILD 05/30/2009  . HYPERTROPHY PROSTATE W/UR OBST & OTH LUTS 02/26/2009  . ERECTILE DYSFUNCTION, ORGANIC 05/30/2009  . ELECTROCARDIOGRAM, ABNORMAL 12/08/2006    F/U by a normal stress test on September 2005  . COLONOSCOPY, HX OF 12/28/2005  . History of thyroid nodule     Resolved by [place] 11/2006  . HYPERTHYROIDISM 12/08/2006    Graves  . Adenomatous colon polyp     Past Surgical History  Procedure Date  . Colonoscopy     History   Social History  . Marital Status: Married    Spouse Name: N/A    Number of Children: 2  . Years of Education: N/A   Occupational History  .      Pastor   Social History Main Topics  . Smoking status: Former Smoker    Types: Cigarettes  . Smokeless tobacco: Never Used   Comment: 1-2 cigarettes per day  . Alcohol Use: Yes     wine socially  . Drug Use: No  . Sexually Active: Not on file     casual smoker   Other Topics Concern  . 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 aspirin 81 MG tablet Take 81 mg by mouth daily.        . cyanocobalamin 1000 MCG tablet Take 100 mcg by mouth daily.        . dorzolamide-timolol (COSOPT) 22.3-6.8 MG/ML ophthalmic solution Place 1 drop into both eyes every 12 (twelve) hours.       . fish oil-omega-3 fatty acids 1000 MG capsule Take 2 g by mouth daily.        . Multiple Vitamin (MULTIVITAMIN) tablet Take 1 tablet by  mouth daily.          No Known Allergies  Family History  Problem Relation Age of Onset  . Cancer Sister     Colon Cancer  . Colon cancer Sister 36  . Diabetes Neg Hx   . Thyroid disease Neg Hx     no goiter or other thyroid problem  . Stomach cancer Neg Hx   . Stroke Other     GP   BP 112/78  Pulse 82  Temp 97.8 F (36.6 C) (Oral)  Resp 16  Wt 149 lb 3 oz (67.671 kg)  SpO2 96%  Review of Systems Denies fever    Objective:   Physical Exam VITAL SIGNS:  See vs page GENERAL: no distress NECK: There is no palpable thyroid enlargement.  No thyroid nodule is palpable.  No palpable lymphadenopathy at the anterior neck.     Assessment & Plan:  Hyperthyroidism, probably recurrent off tapazole

## 2012-02-09 ENCOUNTER — Ambulatory Visit: Payer: Medicare Other | Admitting: Endocrinology

## 2012-02-09 DIAGNOSIS — E0500 Thyrotoxicosis with diffuse goiter without thyrotoxic crisis or storm: Secondary | ICD-10-CM | POA: Diagnosis not present

## 2012-02-09 DIAGNOSIS — F17200 Nicotine dependence, unspecified, uncomplicated: Secondary | ICD-10-CM | POA: Diagnosis not present

## 2012-02-15 ENCOUNTER — Encounter: Payer: Self-pay | Admitting: Endocrinology

## 2012-02-15 ENCOUNTER — Ambulatory Visit (INDEPENDENT_AMBULATORY_CARE_PROVIDER_SITE_OTHER): Payer: Medicare Other | Admitting: Endocrinology

## 2012-02-15 VITALS — BP 112/70 | HR 83 | Temp 97.4°F | Wt 154.0 lb

## 2012-02-15 DIAGNOSIS — E0590 Thyrotoxicosis, unspecified without thyrotoxic crisis or storm: Secondary | ICD-10-CM | POA: Diagnosis not present

## 2012-02-15 LAB — T4, FREE: Free T4: 1.16 ng/dL (ref 0.80–1.80)

## 2012-02-15 LAB — TSH: TSH: 1.66 u[IU]/mL (ref 0.350–4.500)

## 2012-02-15 NOTE — Progress Notes (Signed)
  Subjective:    Patient ID: Tommy Browning, male    DOB: 06/01/1929, 78 y.o.   MRN: [id number]  HPI Pt returns for f/u of hyperthyroidism (dx'ed 2011; due to grave's dz; he chose thionamide rx).  He was taking tapazole 10 mg qd, but tsh was high on this, so it was stopped.  Since then, pt states he feels well in general, except for fatigue.   Past Medical History  Diagnosis Date  . BACK PAIN 02/24/2008  . HYPOGONADISM 02/03/2010  . HYPERLIPIDEMIA, MILD 05/30/2009  . HYPERTROPHY PROSTATE W/UR OBST & OTH LUTS 02/26/2009  . ERECTILE DYSFUNCTION, ORGANIC 05/30/2009  . ELECTROCARDIOGRAM, ABNORMAL 12/08/2006    F/U by a normal stress test on September 2005  . COLONOSCOPY, HX OF 12/28/2005  . History of thyroid nodule     Resolved by [place] 11/2006  . HYPERTHYROIDISM 12/08/2006    Graves  . Adenomatous colon polyp     Past Surgical History  Procedure Date  . Colonoscopy     History   Social History  . Marital Status: Married    Spouse Name: N/A    Number of Children: 2  . Years of Education: N/A   Occupational History  .      Pastor   Social History Main Topics  . Smoking status: Former Smoker    Types: Cigarettes  . Smokeless tobacco: Never Used     Comment: 1-2 cigarettes per day  . Alcohol Use: Yes     Comment: wine socially  . Drug Use: No  . Sexually Active: Not on file     Comment: casual smoker   Other Topics Concern  . 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 aspirin 81 MG tablet Take 81 mg by mouth daily.        . cyanocobalamin 1000 MCG tablet Take 100 mcg by mouth daily.        . dorzolamide-timolol (COSOPT) 22.3-6.8 MG/ML ophthalmic solution Place 1 drop into both eyes every 12 (twelve) hours.       . fish oil-omega-3 fatty acids 1000 MG capsule Take 2 g by mouth daily.        . Multiple Vitamin (MULTIVITAMIN) tablet  Take 1 tablet by mouth daily.          No Known Allergies  Family History  Problem Relation Age of Onset  . Cancer Sister     Colon Cancer  . Colon cancer Sister 8  . Diabetes Neg Hx   . Thyroid disease Neg Hx     no goiter or other thyroid problem  . Stomach cancer Neg Hx   . Stroke Other     GP    BP 112/70  Pulse 83  Temp 97.4 F (36.3 C) (Oral)  Wt 154 lb (69.854 kg)  SpO2 99%  Review of Systems Denies fever.    Objective:   Physical Exam VITAL SIGNS:  See vs page GENERAL: no distress head: no deformity eyes: no periorbital swelling; bilateral proptosis external nose and ears are normal mouth: no lesion seen   Lab Results  Component Value Date   TSH 1.660 02/15/2012      Assessment & Plan:  Hyperthyroidism, has not yet recurred off tapazole

## 2012-02-15 NOTE — Patient Instructions (Addendum)
blood tests are being requested for you today.  You will be contacted with results.   Our plan will be for the thyroid to go overactive again, then resume the methimazole at a lower amount. Please come back for a follow-up appointment in early January.

## 2012-02-27 ENCOUNTER — Other Ambulatory Visit: Payer: Self-pay | Admitting: Endocrinology

## 2012-02-29 NOTE — Telephone Encounter (Signed)
This med has been d/c'ed 

## 2012-02-29 NOTE — Telephone Encounter (Signed)
Should this rx be refilled, looks like med was stopped, please advise or refuse?

## 2012-03-18 DIAGNOSIS — J018 Other acute sinusitis: Secondary | ICD-10-CM | POA: Diagnosis not present

## 2012-04-05 ENCOUNTER — Ambulatory Visit: Payer: Medicare Other | Admitting: Endocrinology

## 2012-04-14 ENCOUNTER — Ambulatory Visit (INDEPENDENT_AMBULATORY_CARE_PROVIDER_SITE_OTHER): Payer: Medicare Other | Admitting: Endocrinology

## 2012-04-14 VITALS — BP 122/80 | HR 76 | Wt 157.0 lb

## 2012-04-14 DIAGNOSIS — E0590 Thyrotoxicosis, unspecified without thyrotoxic crisis or storm: Secondary | ICD-10-CM

## 2012-04-14 LAB — TSH: TSH: 1.752 u[IU]/mL (ref 0.350–4.500)

## 2012-04-14 NOTE — Progress Notes (Signed)
  Subjective:    Patient ID: Tommy Browning, male    DOB: 1929/08/19, 79 y.o.   MRN: [id number]  HPI Pt returns for f/u of hyperthyroidism (dx'ed 2011; due to grave's dz; he chose thionamide rx).  He was taking tapazole 10 mg qd, but tsh was high on this, so it was stopped.  Since then, pt states he feels well in general.   Past Medical History  Diagnosis Date  . BACK PAIN 02/24/2008  . HYPOGONADISM 02/03/2010  . HYPERLIPIDEMIA, MILD 05/30/2009  . HYPERTROPHY PROSTATE W/UR OBST & OTH LUTS 02/26/2009  . ERECTILE DYSFUNCTION, ORGANIC 05/30/2009  . ELECTROCARDIOGRAM, ABNORMAL 12/08/2006    F/U by a normal stress test on September 2005  . COLONOSCOPY, HX OF 12/28/2005  . History of thyroid nodule     Resolved by [place] 11/2006  . HYPERTHYROIDISM 12/08/2006    Graves  . Adenomatous colon polyp     Past Surgical History  Procedure Date  . Colonoscopy     History   Social History  . Marital Status: Married    Spouse Name: N/A    Number of Children: 2  . Years of Education: N/A   Occupational History  .      Pastor   Social History Main Topics  . Smoking status: Former Smoker    Types: Cigarettes  . Smokeless tobacco: Never Used     Comment: 1-2 cigarettes per day  . Alcohol Use: Yes     Comment: wine socially  . Drug Use: No  . Sexually Active: Not on file     Comment: casual smoker   Other Topics Concern  . 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 aspirin 81 MG tablet Take 81 mg by mouth daily.        . cyanocobalamin 1000 MCG tablet Take 100 mcg by mouth daily.        . dorzolamide-timolol (COSOPT) 22.3-6.8 MG/ML ophthalmic solution Place 1 drop into both eyes every 12 (twelve) hours.       . fish oil-omega-3 fatty acids 1000 MG capsule Take 2 g by mouth daily.        . Multiple Vitamin (MULTIVITAMIN) tablet Take 1 tablet by mouth  daily.          No Known Allergies  Family History  Problem Relation Age of Onset  . Cancer Sister     Colon Cancer  . Colon cancer Sister 58  . Diabetes Neg Hx   . Thyroid disease Neg Hx     no goiter or other thyroid problem  . Stomach cancer Neg Hx   . Stroke Other     GP    BP 122/80  Pulse 76  Wt 157 lb (71.215 kg)  SpO2 97%    Review of Systems Denies weight change    Objective:   Physical Exam eyes: no periorbital swelling; bilateral proptosis NECK: There is no palpable thyroid enlargement.  No thyroid nodule is palpable.  No palpable lymphadenopathy at the anterior neck.    Lab Results  Component Value Date   TSH 1.752 04/14/2012      Assessment & Plan:  Hyperthyroidism has not yet recurred off tapazole

## 2012-04-14 NOTE — Patient Instructions (Addendum)
blood tests are being requested for you today.  You will be contacted with results.   Our plan will be for the thyroid to go overactive again, then resume the methimazole at a lower amount. Please come back for a follow-up appointment in 3 months.

## 2012-04-27 DIAGNOSIS — H04129 Dry eye syndrome of unspecified lacrimal gland: Secondary | ICD-10-CM | POA: Diagnosis not present

## 2012-04-27 DIAGNOSIS — H40059 Ocular hypertension, unspecified eye: Secondary | ICD-10-CM | POA: Diagnosis not present

## 2012-04-27 DIAGNOSIS — H2510 Age-related nuclear cataract, unspecified eye: Secondary | ICD-10-CM | POA: Diagnosis not present

## 2012-04-27 DIAGNOSIS — H40029 Open angle with borderline findings, high risk, unspecified eye: Secondary | ICD-10-CM | POA: Diagnosis not present

## 2012-05-10 DIAGNOSIS — F17200 Nicotine dependence, unspecified, uncomplicated: Secondary | ICD-10-CM | POA: Diagnosis not present

## 2012-05-10 DIAGNOSIS — E0500 Thyrotoxicosis with diffuse goiter without thyrotoxic crisis or storm: Secondary | ICD-10-CM | POA: Diagnosis not present

## 2012-06-07 DIAGNOSIS — E0500 Thyrotoxicosis with diffuse goiter without thyrotoxic crisis or storm: Secondary | ICD-10-CM | POA: Diagnosis not present

## 2012-06-07 DIAGNOSIS — H5005 Alternating esotropia: Secondary | ICD-10-CM | POA: Diagnosis not present

## 2012-06-07 DIAGNOSIS — IMO0002 Reserved for concepts with insufficient information to code with codable children: Secondary | ICD-10-CM | POA: Diagnosis not present

## 2012-06-16 DIAGNOSIS — N401 Enlarged prostate with lower urinary tract symptoms: Secondary | ICD-10-CM | POA: Diagnosis not present

## 2012-06-16 DIAGNOSIS — N529 Male erectile dysfunction, unspecified: Secondary | ICD-10-CM | POA: Diagnosis not present

## 2012-07-20 ENCOUNTER — Ambulatory Visit (INDEPENDENT_AMBULATORY_CARE_PROVIDER_SITE_OTHER): Payer: Medicare Other | Admitting: Endocrinology

## 2012-07-20 ENCOUNTER — Encounter: Payer: Self-pay | Admitting: Endocrinology

## 2012-07-20 VITALS — BP 126/80 | HR 78 | Wt 157.0 lb

## 2012-07-20 DIAGNOSIS — E0590 Thyrotoxicosis, unspecified without thyrotoxic crisis or storm: Secondary | ICD-10-CM

## 2012-07-20 NOTE — Patient Instructions (Addendum)
blood tests are being requested for you today.  You will be contacted with results.   Our plan will be for the thyroid to go overactive again, then resume the methimazole at a lower amount. Please come back for a follow-up appointment in 4 months.

## 2012-07-20 NOTE — Progress Notes (Signed)
  Subjective:    Patient ID: Tommy Browning, male    DOB: Jan 16, 1930, 79 y.o.   MRN: [id number]  HPI Pt returns for f/u of hyperthyroidism (dx'ed 2011; due to grave's dz; he chose thionamide rx).  He was taking tapazole 10 mg qd, but tsh was high on this, so it was stopped in mid-2013.  Since then, TFT have stayed normal, and pt states he feels well in general.   Past Medical History  Diagnosis Date  . BACK PAIN 02/24/2008  . HYPOGONADISM 02/03/2010  . HYPERLIPIDEMIA, MILD 05/30/2009  . HYPERTROPHY PROSTATE W/UR OBST & OTH LUTS 02/26/2009  . ERECTILE DYSFUNCTION, ORGANIC 05/30/2009  . ELECTROCARDIOGRAM, ABNORMAL 12/08/2006    F/U by a normal stress test on September 2005  . COLONOSCOPY, HX OF 12/28/2005  . History of thyroid nodule     Resolved by [place] 11/2006  . HYPERTHYROIDISM 12/08/2006    Graves  . Adenomatous colon polyp     Past Surgical History  Procedure Laterality Date  . Colonoscopy      History   Social History  . Marital Status: Married    Spouse Name: N/A    Number of Children: 2  . Years of Education: N/A   Occupational History  .      Pastor   Social History Main Topics  . Smoking status: Former Smoker    Types: Cigarettes  . Smokeless tobacco: Never Used     Comment: 1-2 cigarettes per day  . Alcohol Use: Yes     Comment: wine socially  . Drug Use: No  . Sexually Active: Not on file     Comment: casual smoker   Other Topics Concern  . 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 aspirin 81 MG tablet Take 81 mg by mouth daily.        . cyanocobalamin 1000 MCG tablet Take 100 mcg by mouth daily.        . dorzolamide-timolol (COSOPT) 22.3-6.8 MG/ML ophthalmic solution Place 1 drop into both eyes every 12 (twelve) hours.       . fish oil-omega-3 fatty acids 1000 MG capsule Take 2 g by mouth daily.        .  Multiple Vitamin (MULTIVITAMIN) tablet Take 1 tablet by mouth daily.         No current facility-administered medications on file prior to visit.    No Known Allergies  Family History  Problem Relation Age of Onset  . Cancer Sister     Colon Cancer  . Colon cancer Sister 68  . Diabetes Neg Hx   . Thyroid disease Neg Hx     no goiter or other thyroid problem  . Stomach cancer Neg Hx   . Stroke Other     GP    BP 126/80  Pulse 78  Wt 157 lb (71.215 kg)  BMI 22.53 kg/m2  SpO2 98%  Review of Systems Denies fever    Objective:   Physical Exam VITAL SIGNS:  See vs page GENERAL: no distress Skin: not diaphoretic Neuro: no tremor  Lab Results  Component Value Date   TSH 0.60 07/20/2012      Assessment & Plan:  Hyperthyroidism, well-controlled

## 2012-08-27 IMAGING — CR DG CHEST 2V
2 series · 2 of 2 positions shown · non-contrast
Comparison: 07/13/2005

CLINICAL DATA: Weight loss

CHEST - 2 VIEW

[view not recorded (1 of 2)]
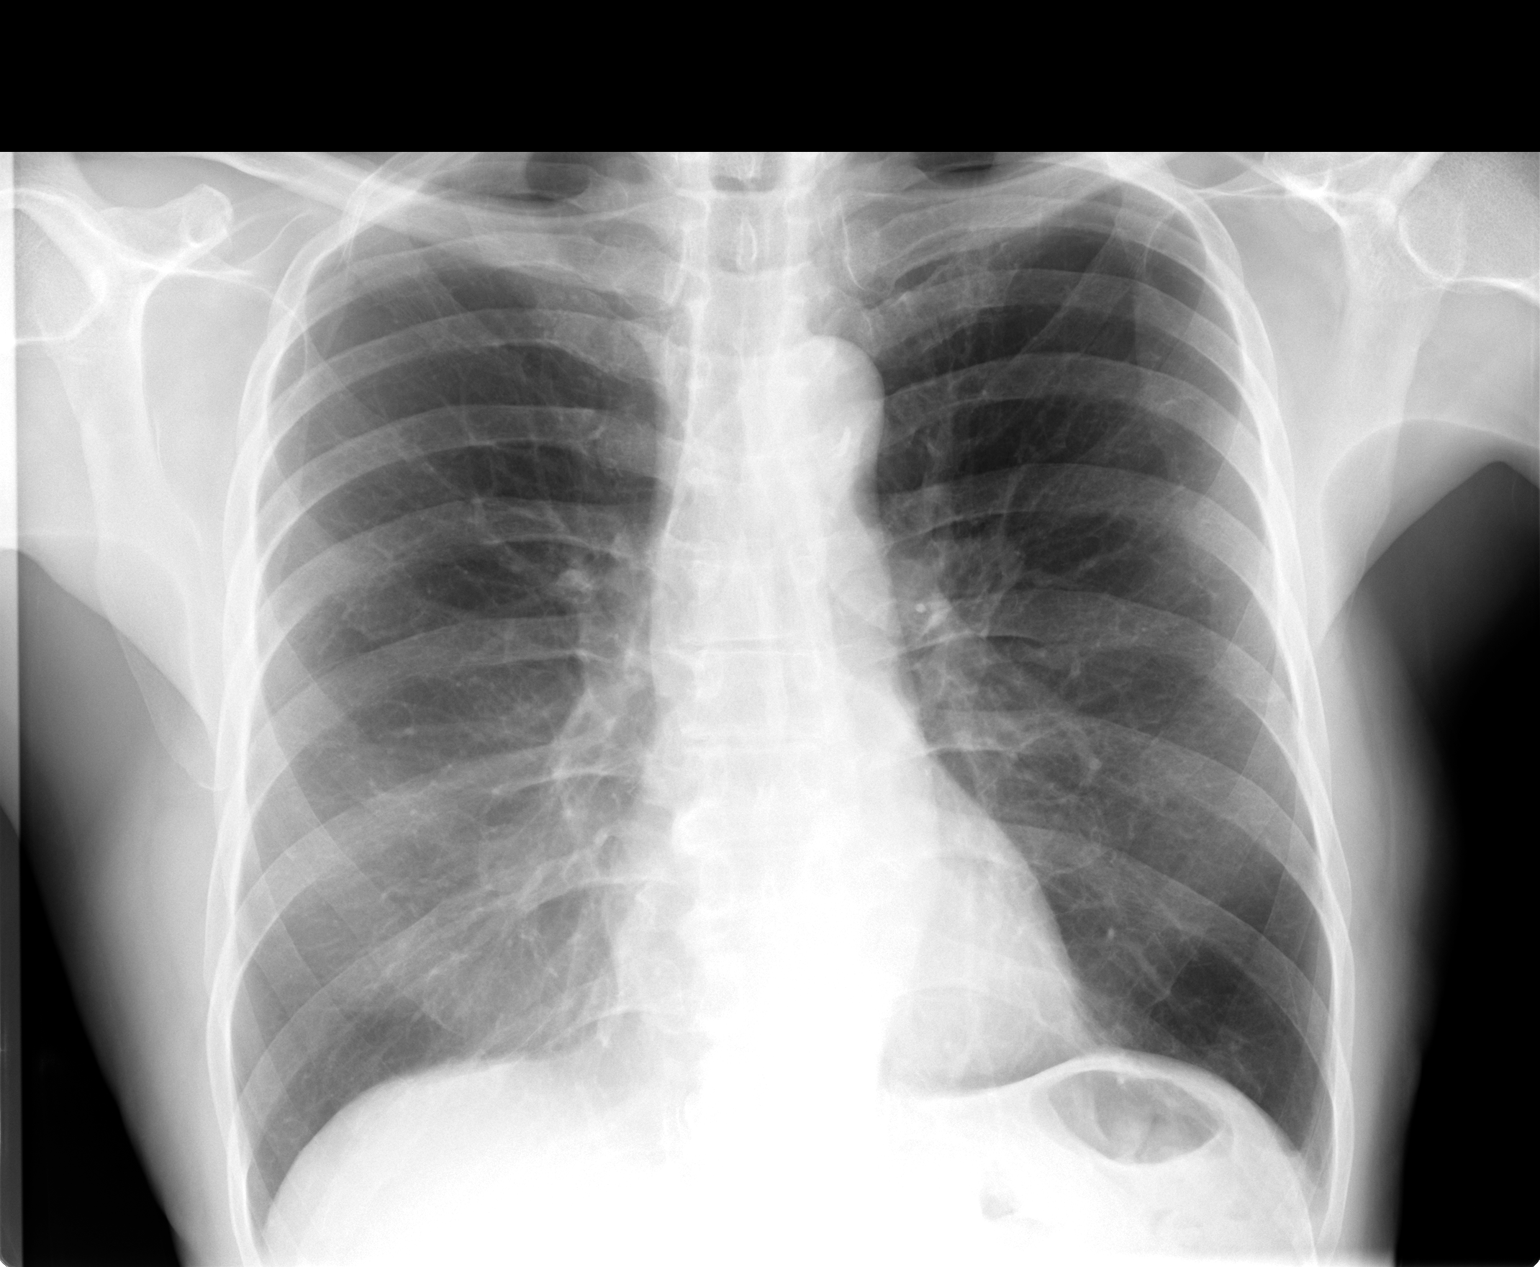

[view not recorded (2 of 2)]
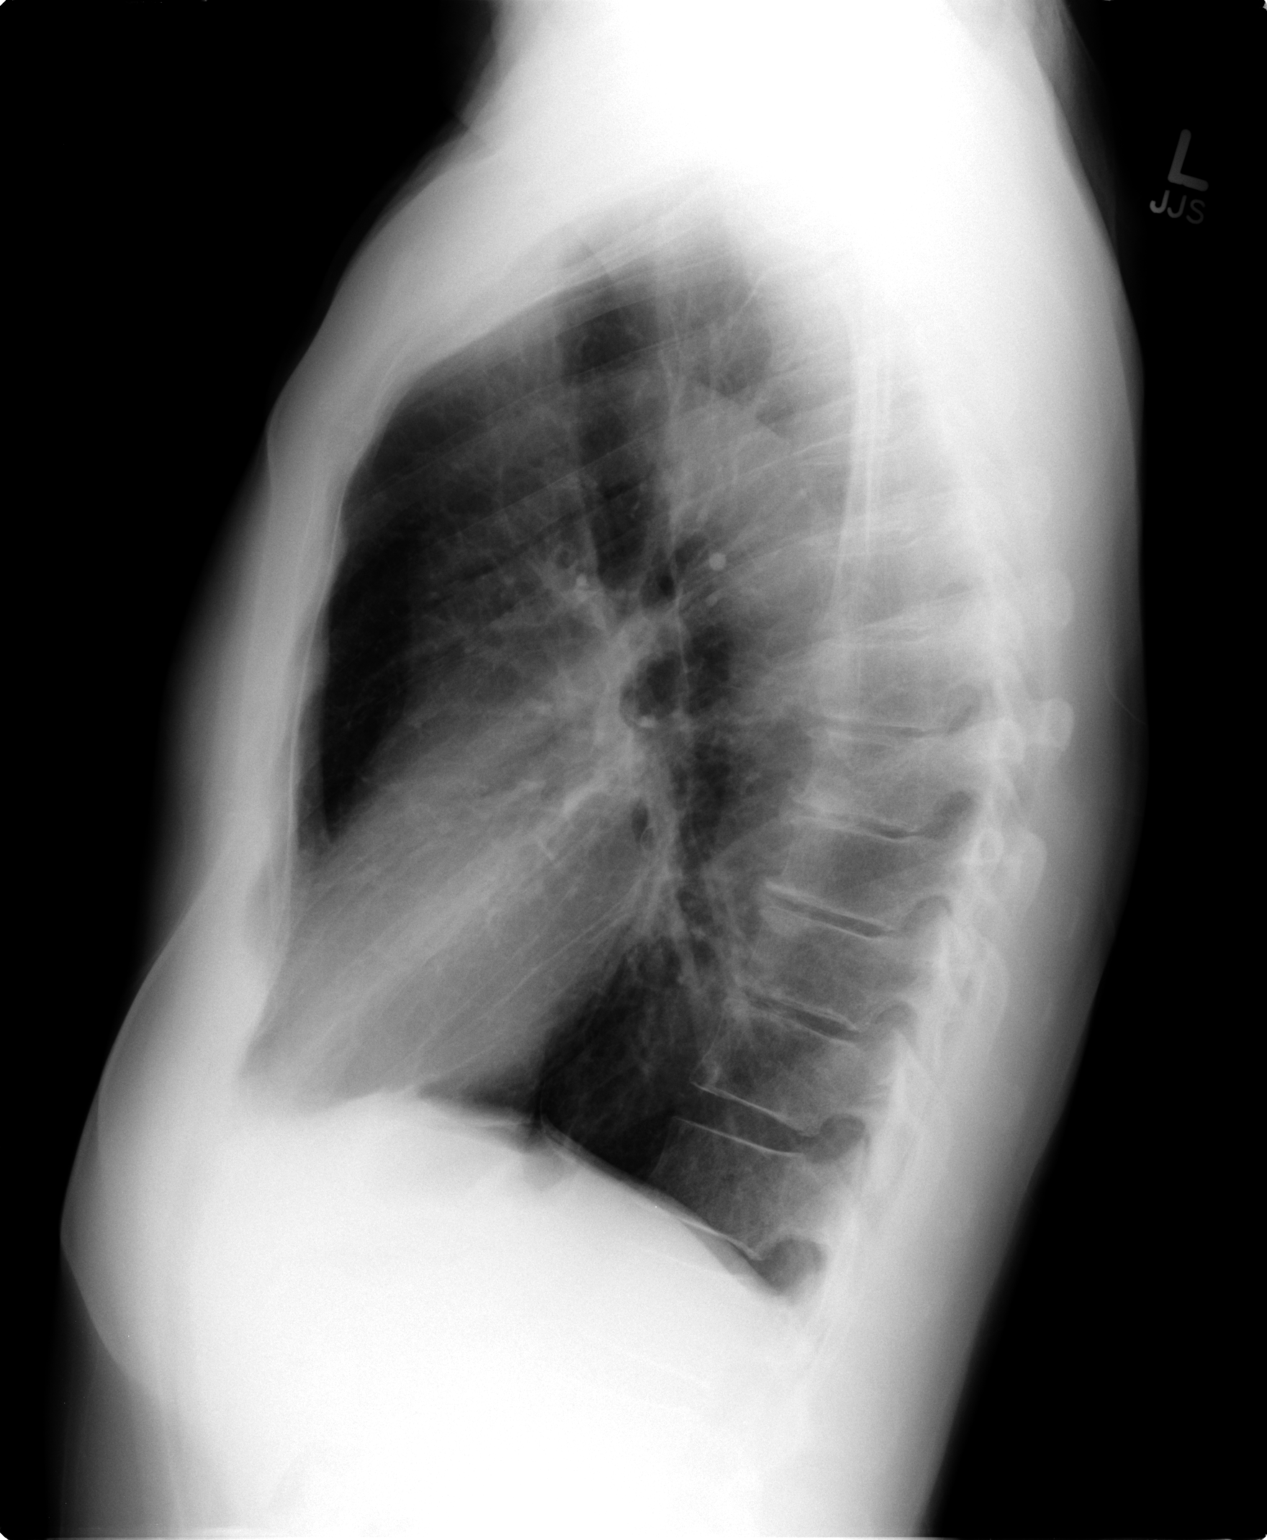

[2 of 2 positions shown; findings below may reference images not displayed]

FINDINGS: The lungs are hyperexpanded.  There is vague increased
opacity seen in the right mid to lower lung which is likely related
to overlying soft tissue.  The lungs are otherwise clear.  The
heart is normal in size.  The upper abdomen and osseous structures
are unremarkable.
IMPRESSION: Hyperexpansion.  Increased opacity over the right hemithorax
detailed above which is likely related to overlying soft tissue.

## 2012-11-23 ENCOUNTER — Ambulatory Visit: Payer: Medicare Other | Admitting: Endocrinology

## 2012-11-23 DIAGNOSIS — Z0289 Encounter for other administrative examinations: Secondary | ICD-10-CM

## 2012-12-27 ENCOUNTER — Telehealth: Payer: Self-pay

## 2012-12-27 NOTE — Telephone Encounter (Signed)
LM for CB  HM UTD WE flu, 2nd PNA (if applicable), Zostavax

## 2012-12-27 NOTE — Telephone Encounter (Signed)
Meds reconciled, pharmacy and allergies verified  Sees Dr. Everardo All for thyroid (currently not taking medication) Last EKG 01/2009

## 2012-12-28 ENCOUNTER — Ambulatory Visit (INDEPENDENT_AMBULATORY_CARE_PROVIDER_SITE_OTHER): Payer: Medicare Other | Admitting: Internal Medicine

## 2012-12-28 ENCOUNTER — Encounter: Payer: Self-pay | Admitting: Internal Medicine

## 2012-12-28 VITALS — BP 155/111 | HR 85 | Temp 97.7°F | Ht 70.0 in | Wt 160.0 lb

## 2012-12-28 DIAGNOSIS — Z23 Encounter for immunization: Secondary | ICD-10-CM | POA: Diagnosis not present

## 2012-12-28 DIAGNOSIS — E0590 Thyrotoxicosis, unspecified without thyrotoxic crisis or storm: Secondary | ICD-10-CM

## 2012-12-28 DIAGNOSIS — Z0000 Encounter for general adult medical examination without abnormal findings: Secondary | ICD-10-CM | POA: Diagnosis not present

## 2012-12-28 DIAGNOSIS — N529 Male erectile dysfunction, unspecified: Secondary | ICD-10-CM

## 2012-12-28 DIAGNOSIS — E785 Hyperlipidemia, unspecified: Secondary | ICD-10-CM | POA: Diagnosis not present

## 2012-12-28 DIAGNOSIS — R9431 Abnormal electrocardiogram [ECG] [EKG]: Secondary | ICD-10-CM

## 2012-12-28 DIAGNOSIS — E291 Testicular hypofunction: Secondary | ICD-10-CM

## 2012-12-28 LAB — BASIC METABOLIC PANEL
CO2: 31 mEq/L (ref 19–32)
Calcium: 9.1 mg/dL (ref 8.4–10.5)
Creatinine, Ser: 1 mg/dL (ref 0.4–1.5)
Glucose, Bld: 86 mg/dL (ref 70–99)

## 2012-12-28 LAB — CBC WITH DIFFERENTIAL/PLATELET
Basophils Relative: 0.6 % (ref 0.0–3.0)
Eosinophils Relative: 7.1 % — ABNORMAL HIGH (ref 0.0–5.0)
HCT: 43.3 % (ref 39.0–52.0)
Hemoglobin: 14.7 g/dL (ref 13.0–17.0)
Lymphs Abs: 1.5 10*3/uL (ref 0.7–4.0)
MCV: 89.9 fl (ref 78.0–100.0)
Monocytes Absolute: 0.5 10*3/uL (ref 0.1–1.0)
RBC: 4.81 Mil/uL (ref 4.22–5.81)
WBC: 3.6 10*3/uL — ABNORMAL LOW (ref 4.5–10.5)

## 2012-12-28 MED ORDER — ZOSTER VACCINE LIVE 19400 UNT/0.65ML ~~LOC~~ SOLR: 0.6500 mL | Freq: Once | SUBCUTANEOUS | Status: DC

## 2012-12-28 MED ORDER — TADALAFIL 20 MG PO TABS: 10.0000 mg | ORAL_TABLET | ORAL | Status: DC | PRN

## 2012-12-28 NOTE — Assessment & Plan Note (Signed)
Used to take Cialis prescribed by urology, would like to restart, prescription issue

## 2012-12-28 NOTE — Assessment & Plan Note (Signed)
Labs, diet-exercise discussed

## 2012-12-28 NOTE — Assessment & Plan Note (Signed)
F/u elsewhere, c/o decreased libido, encouraged to d/w endo or urology

## 2012-12-28 NOTE — Progress Notes (Signed)
Subjective:    Patient ID: Tommy Browning, male    DOB: 30-May-1929, 79 y.o.   MRN: [id number]  HPI Here for Medicare AWV: 1. Risk factors based on Past M, S, F history: reviewed 2. Physical Activities:sedentary life style   3. Depression/mood: neg screening  4. Hearing: No problemss noted or reported  5. ADL's: independent  6. Fall Risk: increased d/t diplopia but no recent falls, precautions discussed  7. home Safety: does feel safe at home  8. Height, weight, & visual acuity: see VS, diplopia, chronic, sees ophtalmology 9. Counseling: provided 10. Labs ordered based on risk factors: if needed  11. Referral Coordination: if needed 12. Care Plan, see assessment and plan  13. Cognitive Assessment: cognition intact   In addition, today we discussed the following: History of hypogonadism, on no medications, still has decreased libido  hyperthyroidism, sees Dr. Everardo All, on no medication at this point Erectile dysfunction, on Cialis prescribed by urology, would like me to start prescribe any. Hyperlipidemia: On no medications, due for labs  Past Medical History  Diagnosis Date  . BACK PAIN 02/24/2008  . HYPOGONADISM 02/03/2010  . HYPERLIPIDEMIA, MILD 05/30/2009  . HYPERTROPHY PROSTATE W/UR OBST & OTH LUTS 02/26/2009  . ERECTILE DYSFUNCTION, ORGANIC 05/30/2009  . ELECTROCARDIOGRAM, ABNORMAL 12/08/2006    F/U by a normal stress test on September 2005  . COLONOSCOPY, HX OF 12/28/2005  . History of thyroid nodule     Resolved by [place] 11/2006  . HYPERTHYROIDISM 12/08/2006    Graves  . Adenomatous colon polyp   . Diplopia    Past Surgical History  Procedure Laterality Date  . Colonoscopy     History   Social History  . Marital Status: Married    Spouse Name: N/A    Number of Children: 2  . Years of Education: N/A   Occupational History  . 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 Smoking status: Former Smoker    Types:  Cigarettes  . Smokeless tobacco: Never Used     Comment: 1-2 cigarettes per day  . Alcohol Use: Yes     Comment: very rare  . Drug Use: No  . Sexual Activity: Not on file     Comment: casual smoker   Other Topics Concern  . 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 Colon cancer Sister 66  . Diabetes Neg Hx   . Thyroid disease Neg Hx     no goiter or other thyroid problem  . Stomach cancer Neg Hx   . Stroke Other     GP  . Prostate cancer Neg Hx       Review of Systems  No  CP, SOB, lower extremity edema Denies  nausea, vomiting diarrhea Denies  blood in the stools No GERD  Sx. (-) cough, sputum production No dysuria, gross hematuria, difficulty urinating      Objective:   Physical Exam BP 155/111  Pulse 85  Temp(Src) 97.7 F (36.5 C)  Ht 5\' 10"  (1.778 m)  Wt 160 lb (72.576 kg)  BMI 22.96 kg/m2  SpO2 95%  General -- alert, well-developed, NAD.   Lungs -- normal respiratory effort, no intercostal retractions, no accessory muscle use,  and normal breath sounds.  Heart-- normal rate, regular rhythm, no murmur.  Abdomen-- Not distended, good bowel sounds,soft, non-tender.  Extremities-- no pretibial edema bilaterally  Neurologic--  alert & oriented X3. Speech normal, gait normal, strength normal in all extremities.    Psych-- Cognition and judgment appear intact. Cooperative with normal attention span and concentration. No anxious appearing , no depressed appearing.        Assessment & Plan:

## 2012-12-28 NOTE — Patient Instructions (Addendum)
Get your blood work before you leave  Next visit in 1 year  I recommend you high dose flu shot this year, you can get it at your pharmacy or here in 2 or 3 weeks  Check the  blood pressure 2 or 3 times a week, be sure it is between 110/60 and 140/85. If it is consistently higher or lower, let me know  Fall Prevention and Home Safety Falls cause injuries and can affect all age groups. It is possible to use preventive measures to significantly decrease the likelihood of falls. There are many simple measures which can make your home safer and prevent falls. OUTDOORS  Repair cracks and edges of walkways and driveways.  Remove high doorway thresholds.  Trim shrubbery on the main path into your home.  Have good outside lighting.  Clear walkways of tools, rocks, debris, and clutter.  Check that handrails are not broken and are securely fastened. Both sides of steps should have handrails.  Have leaves, snow, and ice cleared regularly.  Use sand or salt on walkways during winter months.  In the garage, clean up grease or oil spills. BATHROOM  Install night lights.  Install grab bars by the toilet and in the tub and shower.  Use non-skid mats or decals in the tub or shower.  Place a plastic non-slip stool in the shower to sit on, if needed.  Keep floors dry and clean up all water on the floor immediately.  Remove soap buildup in the tub or shower on a regular basis.  Secure bath mats with non-slip, double-sided rug tape.  Remove throw rugs and tripping hazards from the floors. BEDROOMS  Install night lights.  Make sure a bedside light is easy to reach.  Do not use oversized bedding.  Keep a telephone by your bedside.  Have a firm chair with side arms to use for getting dressed.  Remove throw rugs and tripping hazards from the floor. KITCHEN  Keep handles on pots and pans turned toward the center of the stove. Use back burners when possible.  Clean up spills quickly  and allow time for drying.  Avoid walking on wet floors.  Avoid hot utensils and knives.  Position shelves so they are not too high or low.  Place commonly used objects within easy reach.  If necessary, use a sturdy step stool with a grab bar when reaching.  Keep electrical cables out of the way.  Do not use floor polish or wax that makes floors slippery. If you must use wax, use non-skid floor wax.  Remove throw rugs and tripping hazards from the floor. STAIRWAYS  Never leave objects on stairs.  Place handrails on both sides of stairways and use them. Fix any loose handrails. Make sure handrails on both sides of the stairways are as long as the stairs.  Check carpeting to make sure it is firmly attached along stairs. Make repairs to worn or loose carpet promptly.  Avoid placing throw rugs at the top or bottom of stairways, or properly secure the rug with carpet tape to prevent slippage. Get rid of throw rugs, if possible.  Have an electrician put in a light switch at the top and bottom of the stairs. OTHER FALL PREVENTION TIPS  Wear low-heel or rubber-soled shoes that are supportive and fit well. Wear closed toe shoes.  When using a stepladder, make sure it is fully opened and both spreaders are firmly locked. Do not climb a closed stepladder.  Add color or  contrast paint or tape to grab bars and handrails in your home. Place contrasting color strips on first and last steps.  Learn and use mobility aids as needed. Install an electrical emergency response system.  Turn on lights to avoid dark areas. Replace light bulbs that burn out immediately. Get light switches that glow.  Arrange furniture to create clear pathways. Keep furniture in the same place.  Firmly attach carpet with non-skid or double-sided tape.  Eliminate uneven floor surfaces.  Select a carpet pattern that does not visually hide the edge of steps.  Be aware of all pets. OTHER HOME SAFETY TIPS  Set  the water temperature for 120 F (48.8 C).  Keep emergency numbers on or near the telephone.  Keep smoke detectors on every level of the home and near sleeping areas. Document Released: 03/06/2002 Document Revised: 09/15/2011 Document Reviewed: 06/05/2011 [facility] Patient Information 2014 [place].

## 2012-12-28 NOTE — Assessment & Plan Note (Addendum)
Td 2007 pneumonia shot 2007 rec high dose flu shot zostavax discussed , Rx provided  colonoscopy in 2004 ,  October 2007 and 11-2011 (2 polyps, next per GI) PSAs per urology Diet-exercise discussed BP slt elevated, rec monitoring, see instructions

## 2012-12-28 NOTE — Assessment & Plan Note (Signed)
EKG RBBB, no new , pt asx

## 2012-12-28 NOTE — Assessment & Plan Note (Signed)
F/u by endocrinology, on no meds

## 2013-01-13 ENCOUNTER — Ambulatory Visit (INDEPENDENT_AMBULATORY_CARE_PROVIDER_SITE_OTHER): Payer: Medicare Other | Admitting: Endocrinology

## 2013-01-13 ENCOUNTER — Encounter: Payer: Self-pay | Admitting: Endocrinology

## 2013-01-13 VITALS — BP 126/80 | HR 80 | Wt 158.0 lb

## 2013-01-13 DIAGNOSIS — E0590 Thyrotoxicosis, unspecified without thyrotoxic crisis or storm: Secondary | ICD-10-CM

## 2013-01-13 LAB — T4, FREE: Free T4: 0.94 ng/dL (ref 0.60–1.60)

## 2013-01-13 LAB — TSH: TSH: 1.59 u[IU]/mL (ref 0.35–5.50)

## 2013-01-13 MED ORDER — CLOTRIMAZOLE-BETAMETHASONE 1-0.05 % EX CREA: TOPICAL_CREAM | Freq: Three times a day (TID) | CUTANEOUS | Status: DC

## 2013-01-13 NOTE — Patient Instructions (Addendum)
blood tests are being requested for you today.  We'll contact you with results. Please come back for a follow-up appointment in 6 months. i have sent a prescription to your pharmacy, for the itch.

## 2013-01-13 NOTE — Progress Notes (Signed)
Subjective:    Patient ID: Tommy Browning, male    DOB: October 10, 1929, 79 y.o.   MRN: [id number]  HPI Pt returns for f/u of hyperthyroidism (dx'ed 2011; due to grave's dz; he chose thionamide rx).  He was taking tapazole 10 mg qd, but TSH became high on this, so it was stopped in mid-2013.  Since then, TFT have stayed normal.  pt reports moderate itching of the left foot, but no assoc rash.   Past Medical History  Diagnosis Date  . BACK PAIN 02/24/2008  . HYPOGONADISM 02/03/2010  . HYPERLIPIDEMIA, MILD 05/30/2009  . HYPERTROPHY PROSTATE W/UR OBST & OTH LUTS 02/26/2009  . ERECTILE DYSFUNCTION, ORGANIC 05/30/2009  . ELECTROCARDIOGRAM, ABNORMAL 12/08/2006    F/U by a normal stress test on September 2005  . COLONOSCOPY, HX OF 12/28/2005  . History of thyroid nodule     Resolved by [place] 11/2006  . HYPERTHYROIDISM 12/08/2006    Graves  . Adenomatous colon polyp   . Diplopia     Past Surgical History  Procedure Laterality Date  . Colonoscopy      History   Social History  . Marital Status: Married    Spouse Name: N/A    Number of Children: 2  . Years of Education: N/A   Occupational History  . 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 Smoking status: Former Smoker    Types: Cigarettes  . Smokeless tobacco: Never Used     Comment: 1-2 cigarettes per day  . Alcohol Use: Yes     Comment: very rare  . Drug Use: No  . Sexual Activity: Not on file     Comment: casual smoker   Other Topics Concern  . 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 aspirin 81 MG tablet Take 81 mg by mouth daily.        . cyanocobalamin 1000 MCG tablet Take 100 mcg by mouth daily.        . dorzolamide-timolol (COSOPT) 22.3-6.8 MG/ML ophthalmic solution Place 1 drop into both eyes  every 12 (twelve) hours.       . fish oil-omega-3 fatty acids 1000 MG capsule Take 2 g by mouth daily.        . Multiple Vitamin (MULTIVITAMIN) tablet Take 1 tablet by mouth daily.        . tadalafil (CIALIS) 20 MG tablet Take 0.5-1 tablets (10-20 mg total) by mouth every other day as needed for erectile dysfunction.  5 tablet  3  . zoster vaccine live, PF, (ZOSTAVAX) 40981 UNT/0.65ML injection Inject 19,400 Units into the skin once.  1 each  0   No current facility-administered medications on file prior to visit.    No Known Allergies  Family History  Problem Relation Age of Onset  . Colon cancer Sister 49  . Diabetes Neg Hx   . Thyroid disease Neg Hx     no goiter or other thyroid problem  . Stomach cancer Neg Hx   . Stroke Other     GP  . Prostate cancer Neg Hx     BP 126/80  Pulse 80  Wt 158 lb (71.668 kg)  BMI 22.67  kg/m2  SpO2 92%  Review of Systems Denies fever and weight change.      Objective:   Physical Exam VITAL SIGNS:  See vs page.   GENERAL: no distress. Skin: no rash on the left foot.    Lab Results  Component Value Date   TSH 1.59 01/13/2013      Assessment & Plan:  Hyperthyroidism: no rx needed now Pruritis, new, uncertain etiology

## 2013-02-10 ENCOUNTER — Other Ambulatory Visit (INDEPENDENT_AMBULATORY_CARE_PROVIDER_SITE_OTHER): Payer: Medicare Other

## 2013-02-10 DIAGNOSIS — E785 Hyperlipidemia, unspecified: Secondary | ICD-10-CM

## 2013-02-10 LAB — AST: AST: 27 U/L (ref 0–37)

## 2013-02-10 LAB — LIPID PANEL: VLDL: 23.8 mg/dL (ref 0.0–40.0)

## 2013-02-10 LAB — ALT: ALT: 17 U/L (ref 0–53)

## 2013-02-10 LAB — LDL CHOLESTEROL, DIRECT: Direct LDL: 170.1 mg/dL

## 2013-02-15 ENCOUNTER — Telehealth: Payer: Self-pay | Admitting: Internal Medicine

## 2013-02-15 NOTE — Telephone Encounter (Signed)
Patient is calling about his recent lab results. Please advise.

## 2013-02-16 ENCOUNTER — Other Ambulatory Visit: Payer: Self-pay | Admitting: *Deleted

## 2013-02-16 DIAGNOSIS — E785 Hyperlipidemia, unspecified: Secondary | ICD-10-CM

## 2013-02-16 DIAGNOSIS — R899 Unspecified abnormal finding in specimens from other organs, systems and tissues: Secondary | ICD-10-CM

## 2013-02-16 NOTE — Telephone Encounter (Signed)
Called and spoke with patient concerning results.

## 2013-03-28 ENCOUNTER — Other Ambulatory Visit (INDEPENDENT_AMBULATORY_CARE_PROVIDER_SITE_OTHER): Payer: Medicare Other

## 2013-03-28 DIAGNOSIS — R6889 Other general symptoms and signs: Secondary | ICD-10-CM

## 2013-03-28 DIAGNOSIS — E785 Hyperlipidemia, unspecified: Secondary | ICD-10-CM

## 2013-03-28 DIAGNOSIS — R899 Unspecified abnormal finding in specimens from other organs, systems and tissues: Secondary | ICD-10-CM

## 2013-03-28 LAB — LDL CHOLESTEROL, DIRECT: Direct LDL: 182.8 mg/dL

## 2013-03-28 LAB — LIPID PANEL
Cholesterol: 266 mg/dL — ABNORMAL HIGH (ref 0–200)
HDL: 49.7 mg/dL (ref >39.00)
Triglycerides: 126 mg/dL (ref 0.0–149.0)
VLDL: 25.2 mg/dL (ref 0.0–40.0)

## 2013-03-28 LAB — AST: AST: 27 U/L (ref 0–37)

## 2013-04-04 ENCOUNTER — Telehealth: Payer: Self-pay | Admitting: *Deleted

## 2013-04-04 MED ORDER — ATORVASTATIN CALCIUM 20 MG PO TABS: 20.0000 mg | ORAL_TABLET | Freq: Every day | ORAL | Status: DC

## 2013-04-04 NOTE — Telephone Encounter (Signed)
Patient would like a mailed copy of his recent lab work also he would like someone to call him with his lab work too.

## 2013-04-04 NOTE — Telephone Encounter (Signed)
Done

## 2013-04-27 DIAGNOSIS — H04129 Dry eye syndrome of unspecified lacrimal gland: Secondary | ICD-10-CM | POA: Diagnosis not present

## 2013-04-27 DIAGNOSIS — IMO0002 Reserved for concepts with insufficient information to code with codable children: Secondary | ICD-10-CM | POA: Diagnosis not present

## 2013-04-27 DIAGNOSIS — H532 Diplopia: Secondary | ICD-10-CM | POA: Diagnosis not present

## 2013-04-27 DIAGNOSIS — H40059 Ocular hypertension, unspecified eye: Secondary | ICD-10-CM | POA: Diagnosis not present

## 2013-04-27 DIAGNOSIS — E0500 Thyrotoxicosis with diffuse goiter without thyrotoxic crisis or storm: Secondary | ICD-10-CM | POA: Diagnosis not present

## 2013-04-27 DIAGNOSIS — H02839 Dermatochalasis of unspecified eye, unspecified eyelid: Secondary | ICD-10-CM | POA: Diagnosis not present

## 2013-05-16 ENCOUNTER — Other Ambulatory Visit: Payer: Medicare Other

## 2013-05-19 ENCOUNTER — Other Ambulatory Visit (INDEPENDENT_AMBULATORY_CARE_PROVIDER_SITE_OTHER): Payer: Medicare Other

## 2013-05-19 DIAGNOSIS — R6889 Other general symptoms and signs: Secondary | ICD-10-CM

## 2013-05-19 DIAGNOSIS — E785 Hyperlipidemia, unspecified: Secondary | ICD-10-CM

## 2013-05-19 LAB — LDL CHOLESTEROL, DIRECT: Direct LDL: 182.8 mg/dL

## 2013-05-19 LAB — LIPID PANEL
CHOL/HDL RATIO: 5
Cholesterol: 270 mg/dL — ABNORMAL HIGH (ref 0–200)
HDL: 59.9 mg/dL (ref >39.00)
TRIGLYCERIDES: 108 mg/dL (ref 0.0–149.0)
VLDL: 21.6 mg/dL (ref 0.0–40.0)

## 2013-05-19 LAB — ALT: ALT: 22 U/L (ref 0–53)

## 2013-05-19 LAB — AST: AST: 27 U/L (ref 0–37)

## 2013-05-29 MED ORDER — ATORVASTATIN CALCIUM 20 MG PO TABS: 20.0000 mg | ORAL_TABLET | Freq: Every day | ORAL | Status: DC

## 2013-05-29 NOTE — Addendum Note (Signed)
Addended by: Peggyann Shoals on: 05/29/2013 11:16 AM   Modules accepted: Orders

## 2013-07-11 ENCOUNTER — Other Ambulatory Visit (INDEPENDENT_AMBULATORY_CARE_PROVIDER_SITE_OTHER): Payer: Medicare Other

## 2013-07-11 DIAGNOSIS — E785 Hyperlipidemia, unspecified: Secondary | ICD-10-CM

## 2013-07-11 LAB — LIPID PANEL
Cholesterol: 240 mg/dL — ABNORMAL HIGH (ref 0–200)
HDL: 52.2 mg/dL (ref >39.00)
LDL Cholesterol: 169 mg/dL — ABNORMAL HIGH (ref 0–99)
Total CHOL/HDL Ratio: 5
Triglycerides: 93 mg/dL (ref 0.0–149.0)
VLDL: 18.6 mg/dL (ref 0.0–40.0)

## 2013-07-11 LAB — AST: AST: 30 U/L (ref 0–37)

## 2013-07-11 LAB — ALT: ALT: 21 U/L (ref 0–53)

## 2013-07-17 ENCOUNTER — Encounter: Payer: Self-pay | Admitting: *Deleted

## 2013-07-25 DIAGNOSIS — N529 Male erectile dysfunction, unspecified: Secondary | ICD-10-CM | POA: Diagnosis not present

## 2013-07-25 DIAGNOSIS — E291 Testicular hypofunction: Secondary | ICD-10-CM | POA: Diagnosis not present

## 2013-07-25 DIAGNOSIS — N401 Enlarged prostate with lower urinary tract symptoms: Secondary | ICD-10-CM | POA: Diagnosis not present

## 2013-08-31 DIAGNOSIS — N401 Enlarged prostate with lower urinary tract symptoms: Secondary | ICD-10-CM | POA: Diagnosis not present

## 2013-08-31 DIAGNOSIS — N529 Male erectile dysfunction, unspecified: Secondary | ICD-10-CM | POA: Diagnosis not present

## 2013-08-31 DIAGNOSIS — N139 Obstructive and reflux uropathy, unspecified: Secondary | ICD-10-CM | POA: Diagnosis not present

## 2013-10-25 DIAGNOSIS — H40059 Ocular hypertension, unspecified eye: Secondary | ICD-10-CM | POA: Diagnosis not present

## 2013-10-25 DIAGNOSIS — IMO0002 Reserved for concepts with insufficient information to code with codable children: Secondary | ICD-10-CM | POA: Diagnosis not present

## 2013-10-25 DIAGNOSIS — H2589 Other age-related cataract: Secondary | ICD-10-CM | POA: Diagnosis not present

## 2013-10-25 DIAGNOSIS — E0500 Thyrotoxicosis with diffuse goiter without thyrotoxic crisis or storm: Secondary | ICD-10-CM | POA: Diagnosis not present

## 2013-10-25 DIAGNOSIS — H5000 Unspecified esotropia: Secondary | ICD-10-CM | POA: Diagnosis not present

## 2013-12-27 ENCOUNTER — Ambulatory Visit (INDEPENDENT_AMBULATORY_CARE_PROVIDER_SITE_OTHER): Payer: Medicare Other | Admitting: Internal Medicine

## 2013-12-27 ENCOUNTER — Encounter: Payer: Self-pay | Admitting: Internal Medicine

## 2013-12-27 VITALS — BP 118/78 | HR 73 | Temp 97.8°F | Resp 18 | Ht 70.0 in | Wt 159.8 lb

## 2013-12-27 DIAGNOSIS — E291 Testicular hypofunction: Secondary | ICD-10-CM | POA: Diagnosis not present

## 2013-12-27 DIAGNOSIS — E785 Hyperlipidemia, unspecified: Secondary | ICD-10-CM

## 2013-12-27 DIAGNOSIS — E0590 Thyrotoxicosis, unspecified without thyrotoxic crisis or storm: Secondary | ICD-10-CM | POA: Diagnosis not present

## 2013-12-27 NOTE — Patient Instructions (Signed)
We have given you the flu shot today.   We will not change your medicines today and we do not need to take any blood work.   Come back in about 1 year for a check up and to check on your cholesterol. If you have any problems or questions please feel free to call the office.  Exercise to Stay Healthy Exercise helps you become and stay healthy. EXERCISE IDEAS AND TIPS Choose exercises that:  You enjoy.  Fit into your day. You do not need to exercise really hard to be healthy. You can do exercises at a slow or medium level and stay healthy. You can:  Stretch before and after working out.  Try yoga, Pilates, or tai chi.  Lift weights.  Walk fast, swim, jog, run, climb stairs, bicycle, dance, or rollerskate.  Take aerobic classes. Exercises that burn about 150 calories:  Running 1  miles in 15 minutes.  Playing volleyball for 45 to 60 minutes.  Washing and waxing a car for 45 to 60 minutes.  Playing touch football for 45 minutes.  Walking 1  miles in 35 minutes.  Pushing a stroller 1  miles in 30 minutes.  Playing basketball for 30 minutes.  Raking leaves for 30 minutes.  Bicycling 5 miles in 30 minutes.  Walking 2 miles in 30 minutes.  Dancing for 30 minutes.  Shoveling snow for 15 minutes.  Swimming laps for 20 minutes.  Walking up stairs for 15 minutes.  Bicycling 4 miles in 15 minutes.  Gardening for 30 to 45 minutes.  Jumping rope for 15 minutes.  Washing windows or floors for 45 to 60 minutes. Document Released: 04/18/2010 Document Revised: 06/08/2011 Document Reviewed: 04/18/2010 [facility] Patient Information 2015 [place], [place]. This information is not intended to replace advice given to you by your health care provider. Make sure you discuss any questions you have with your health care provider.

## 2013-12-27 NOTE — Assessment & Plan Note (Signed)
Previously on meds and now thyroid function stable and off meds. Follows with Dr. Loanne Drilling yearly.

## 2013-12-27 NOTE — Assessment & Plan Note (Signed)
Recently started on testosterone gel from Dr. Kellie Simmering.

## 2013-12-27 NOTE — Progress Notes (Signed)
Pre visit review using our clinic review tool, if applicable. No additional management support is needed unless otherwise documented below in the visit note. 

## 2013-12-27 NOTE — Assessment & Plan Note (Signed)
Unclear if he was ever taking lipitor but not now. Will recheck lipid panel at next visit. Last LDL 160s which is reasonable given lack of other co-morbidities.

## 2013-12-27 NOTE — Progress Notes (Signed)
   Subjective:    Patient ID: Tommy Browning, male    DOB: 10-16-1929, 80 y.o.   MRN: [id number]  HPI The patient is an 80 YO man who is coming in today to establish care. He has PMH of hyperlipidemia, ED, hx colon cancer. He has recently been started on testosterone gel by his urologist and has not noticed a difference in the 3 days he has been taking it. He denies chest pains, SOB, abdominal pain, constipation, diarrhea. He denies bad joint pains or falls or balance problems. He has been recommended a surgery on his right eye s/p graves disease but he is not sure that he wants to do that.   Review of Systems  Constitutional: Negative for fever, activity change, appetite change and fatigue.  HENT: Negative.   Eyes: Positive for visual disturbance.  Respiratory: Negative for cough, chest tightness, shortness of breath and wheezing.   Cardiovascular: Negative for chest pain, palpitations and leg swelling.  Gastrointestinal: Negative for abdominal pain, diarrhea, constipation and abdominal distention.  Endocrine: Negative.   Musculoskeletal: Negative.   Skin: Negative.   Allergic/Immunologic: Negative.   Neurological: Negative for dizziness, weakness, light-headedness and headaches.      Objective:   Physical Exam  Constitutional: He is oriented to person, place, and time. He appears well-developed and well-nourished.  HENT:  Head: Normocephalic and atraumatic.  Eyes:  Right eye with higher eyelid than left and slight prominence  Neck: Normal range of motion.  Cardiovascular: Normal rate and regular rhythm.   Pulmonary/Chest: Effort normal and breath sounds normal. No respiratory distress. He has no wheezes. He has no rales.  Abdominal: Soft. Bowel sounds are normal. He exhibits no distension. There is no tenderness. There is no rebound.  Neurological: He is alert and oriented to person, place, and time.  Skin: Skin is warm and dry.      Assessment & Plan:

## 2014-03-01 DIAGNOSIS — N529 Male erectile dysfunction, unspecified: Secondary | ICD-10-CM | POA: Diagnosis not present

## 2014-03-01 DIAGNOSIS — R972 Elevated prostate specific antigen [PSA]: Secondary | ICD-10-CM | POA: Diagnosis not present

## 2014-03-08 DIAGNOSIS — R972 Elevated prostate specific antigen [PSA]: Secondary | ICD-10-CM | POA: Diagnosis not present

## 2014-03-08 DIAGNOSIS — N529 Male erectile dysfunction, unspecified: Secondary | ICD-10-CM | POA: Diagnosis not present

## 2014-04-05 DIAGNOSIS — J019 Acute sinusitis, unspecified: Secondary | ICD-10-CM | POA: Diagnosis not present

## 2014-06-07 DIAGNOSIS — R972 Elevated prostate specific antigen [PSA]: Secondary | ICD-10-CM | POA: Diagnosis not present

## 2014-06-14 DIAGNOSIS — N529 Male erectile dysfunction, unspecified: Secondary | ICD-10-CM | POA: Diagnosis not present

## 2014-06-14 DIAGNOSIS — N401 Enlarged prostate with lower urinary tract symptoms: Secondary | ICD-10-CM | POA: Diagnosis not present

## 2014-06-14 DIAGNOSIS — R351 Nocturia: Secondary | ICD-10-CM | POA: Diagnosis not present

## 2014-06-14 DIAGNOSIS — R972 Elevated prostate specific antigen [PSA]: Secondary | ICD-10-CM | POA: Diagnosis not present

## 2014-09-26 DIAGNOSIS — H40053 Ocular hypertension, bilateral: Secondary | ICD-10-CM | POA: Diagnosis not present

## 2014-09-26 DIAGNOSIS — H25813 Combined forms of age-related cataract, bilateral: Secondary | ICD-10-CM | POA: Diagnosis not present

## 2014-09-26 DIAGNOSIS — H0589 Other disorders of orbit: Secondary | ICD-10-CM | POA: Diagnosis not present

## 2014-09-26 DIAGNOSIS — H532 Diplopia: Secondary | ICD-10-CM | POA: Diagnosis not present

## 2014-09-26 DIAGNOSIS — H5022 Vertical strabismus, left eye: Secondary | ICD-10-CM | POA: Diagnosis not present

## 2014-11-19 DIAGNOSIS — N529 Male erectile dysfunction, unspecified: Secondary | ICD-10-CM | POA: Diagnosis not present

## 2014-11-19 DIAGNOSIS — R972 Elevated prostate specific antigen [PSA]: Secondary | ICD-10-CM | POA: Diagnosis not present

## 2014-11-23 DIAGNOSIS — R351 Nocturia: Secondary | ICD-10-CM | POA: Diagnosis not present

## 2014-11-23 DIAGNOSIS — N529 Male erectile dysfunction, unspecified: Secondary | ICD-10-CM | POA: Diagnosis not present

## 2014-11-23 DIAGNOSIS — N401 Enlarged prostate with lower urinary tract symptoms: Secondary | ICD-10-CM | POA: Diagnosis not present

## 2014-11-23 DIAGNOSIS — R972 Elevated prostate specific antigen [PSA]: Secondary | ICD-10-CM | POA: Diagnosis not present

## 2014-12-31 ENCOUNTER — Other Ambulatory Visit (INDEPENDENT_AMBULATORY_CARE_PROVIDER_SITE_OTHER): Payer: Medicare Other

## 2014-12-31 ENCOUNTER — Ambulatory Visit (INDEPENDENT_AMBULATORY_CARE_PROVIDER_SITE_OTHER): Payer: Medicare Other | Admitting: Internal Medicine

## 2014-12-31 ENCOUNTER — Encounter: Payer: Self-pay | Admitting: Internal Medicine

## 2014-12-31 VITALS — BP 120/82 | HR 64 | Temp 98.0°F | Resp 12 | Ht 70.0 in | Wt 156.0 lb

## 2014-12-31 DIAGNOSIS — Z23 Encounter for immunization: Secondary | ICD-10-CM | POA: Diagnosis not present

## 2014-12-31 DIAGNOSIS — E785 Hyperlipidemia, unspecified: Secondary | ICD-10-CM | POA: Diagnosis not present

## 2014-12-31 DIAGNOSIS — E0590 Thyrotoxicosis, unspecified without thyrotoxic crisis or storm: Secondary | ICD-10-CM | POA: Diagnosis not present

## 2014-12-31 DIAGNOSIS — Z0000 Encounter for general adult medical examination without abnormal findings: Secondary | ICD-10-CM

## 2014-12-31 DIAGNOSIS — D649 Anemia, unspecified: Secondary | ICD-10-CM

## 2014-12-31 LAB — CBC
HEMATOCRIT: 42.7 % (ref 39.0–52.0)
HEMOGLOBIN: 14.2 g/dL (ref 13.0–17.0)
MCHC: 33.1 g/dL (ref 30.0–36.0)
MCV: 90.6 fl (ref 78.0–100.0)
Platelets: 241 10*3/uL (ref 150.0–400.0)
RBC: 4.72 Mil/uL (ref 4.22–5.81)
RDW: 13.7 % (ref 11.5–15.5)
WBC: 3.6 10*3/uL — ABNORMAL LOW (ref 4.0–10.5)

## 2014-12-31 LAB — COMPREHENSIVE METABOLIC PANEL
ALK PHOS: 72 U/L (ref 39–117)
ALT: 16 U/L (ref 0–53)
AST: 24 U/L (ref 0–37)
Albumin: 3.9 g/dL (ref 3.5–5.2)
BUN: 17 mg/dL (ref 6–23)
CO2: 29 meq/L (ref 19–32)
Calcium: 9.5 mg/dL (ref 8.4–10.5)
Chloride: 104 mEq/L (ref 96–112)
Creatinine, Ser: 1.13 mg/dL (ref 0.40–1.50)
GFR: 79.64 mL/min (ref >60.00)
GLUCOSE: 77 mg/dL (ref 70–99)
POTASSIUM: 4.2 meq/L (ref 3.5–5.1)
SODIUM: 140 meq/L (ref 135–145)
TOTAL PROTEIN: 6.9 g/dL (ref 6.0–8.3)
Total Bilirubin: 0.5 mg/dL (ref 0.2–1.2)

## 2014-12-31 LAB — LIPID PANEL
CHOL/HDL RATIO: 4
CHOLESTEROL: 238 mg/dL — AB (ref 0–200)
HDL: 55.6 mg/dL (ref >39.00)
LDL Cholesterol: 145 mg/dL — ABNORMAL HIGH (ref 0–99)
NonHDL: 182.8
TRIGLYCERIDES: 190 mg/dL — AB (ref 0.0–149.0)
VLDL: 38 mg/dL (ref 0.0–40.0)

## 2014-12-31 LAB — TSH: TSH: 2.25 u[IU]/mL (ref 0.35–4.50)

## 2014-12-31 LAB — T4, FREE: FREE T4: 0.93 ng/dL (ref 0.60–1.60)

## 2014-12-31 NOTE — Assessment & Plan Note (Signed)
Given flu and prevnar 13 shots at visit. Completed pneumonia series and shingles. Colonoscopy not indicated any longer. Checking labs today and adjust as needed. Counseled about the need for regular exercise to help keep himself healthy.

## 2014-12-31 NOTE — Progress Notes (Signed)
   Subjective:    Patient ID: Tommy Browning, male    DOB: [birth year], 81 y.o.   MRN: [id number]  HPI Here for medicare wellness, no new complaints. Please see A/P for status and treatment of chronic medical problems.   Diet: heart healthy Physical activity: sedentary Depression/mood screen: negative Hearing: intact to whispered voice Visual acuity: grossly normal, performs annual eye exam  ADLs: capable Fall risk: none Home safety: good Cognitive evaluation: intact to orientation, naming, recall and repetition EOL planning: adv directives discussed  I have personally reviewed and have noted 1. The patient's medical and social history - reviewed today no changes 2. Their use of alcohol, tobacco or illicit drugs 3. Their current medications and supplements 4. The patient's functional ability including ADL's, fall risks, home safety risks and hearing or visual impairment. 5. Diet and physical activities 6. Evidence for depression or mood disorders 7. Care team reviewed and updated (available in snapshot)  Review of Systems  Constitutional: Negative for fever, activity change, appetite change and fatigue.  HENT: Negative.   Eyes: Positive for visual disturbance.  Respiratory: Negative for cough, chest tightness, shortness of breath and wheezing.   Cardiovascular: Negative for chest pain, palpitations and leg swelling.  Gastrointestinal: Negative for abdominal pain, diarrhea, constipation and abdominal distention.  Endocrine: Negative.   Musculoskeletal: Negative.   Skin: Negative.   Allergic/Immunologic: Negative.   Neurological: Negative for dizziness, weakness, light-headedness and headaches.      Objective:   Physical Exam  Constitutional: He is oriented to person, place, and time. He appears well-developed and well-nourished.  HENT:  Head: Normocephalic and atraumatic.  Neck: Normal range of motion.  Cardiovascular: Normal rate and regular rhythm.   Pulmonary/Chest:  Effort normal and breath sounds normal. No respiratory distress. He has no wheezes. He has no rales.  Abdominal: Soft. Bowel sounds are normal. He exhibits no distension. There is no tenderness. There is no rebound.  Neurological: He is alert and oriented to person, place, and time.  Skin: Skin is warm and dry.   Filed Vitals:   12/31/14 1303  BP: 120/82  Pulse: 64  Temp: 98 F (36.7 C)  TempSrc: Oral  Resp: 12  Height: 5\' 10"  (1.778 m)  Weight: 156 lb (70.761 kg)      Assessment & Plan:  Flu and prevnar 13 given at visit.

## 2014-12-31 NOTE — Patient Instructions (Signed)
We are checking the labs today and will call you with the results.   We have given you the pneumonia and the flu shot today.   Health Maintenance A healthy lifestyle and preventative care can promote health and wellness.  Maintain regular health, dental, and eye exams.  Eat a healthy diet. Foods like vegetables, fruits, whole grains, low-fat dairy products, and lean protein foods contain the nutrients you need and are low in calories. Decrease your intake of foods high in solid fats, added sugars, and salt. Get information about a proper diet from your health care provider, if necessary.  Regular physical exercise is one of the most important things you can do for your health. Most adults should get at least 150 minutes of moderate-intensity exercise (any activity that increases your heart rate and causes you to sweat) each week. In addition, most adults need muscle-strengthening exercises on 2 or more days a week.   Maintain a healthy weight. The body mass index (BMI) is a screening tool to identify possible weight problems. It provides an estimate of body fat based on height and weight. Your health care provider can find your BMI and can help you achieve or maintain a healthy weight. For males 20 years and older:  A BMI below 18.5 is considered underweight.  A BMI of 18.5 to 24.9 is normal.  A BMI of 25 to 29.9 is considered overweight.  A BMI of 30 and above is considered obese.  Maintain normal blood lipids and cholesterol by exercising and minimizing your intake of saturated fat. Eat a balanced diet with plenty of fruits and vegetables. Blood tests for lipids and cholesterol should begin at age 92 and be repeated every 5 years. If your lipid or cholesterol levels are high, you are over age 67, or you are at high risk for heart disease, you may need your cholesterol levels checked more frequently.Ongoing high lipid and cholesterol levels should be treated with medicines if diet and  exercise are not working.  If you smoke, find out from your health care provider how to quit. If you do not use tobacco, do not start.  Lung cancer screening is recommended for adults aged 24-80 years who are at high risk for developing lung cancer because of a history of smoking. A yearly low-dose CT scan of the lungs is recommended for people who have at least a 30-pack-year history of smoking and are current smokers or have quit within the past 15 years. A pack year of smoking is smoking an average of 1 pack of cigarettes a day for 1 year (for example, a 30-pack-year history of smoking could mean smoking 1 pack a day for 30 years or 2 packs a day for 15 years). Yearly screening should continue until the smoker has stopped smoking for at least 15 years. Yearly screening should be stopped for people who develop a health problem that would prevent them from having lung cancer treatment.  If you choose to drink alcohol, do not have more than 2 drinks per day. One drink is considered to be 12 oz (360 mL) of beer, 5 oz (150 mL) of wine, or 1.5 oz (45 mL) of liquor.  Avoid the use of street drugs. Do not share needles with anyone. Ask for help if you need support or instructions about stopping the use of drugs.  High blood pressure causes heart disease and increases the risk of stroke. Blood pressure should be checked at least every 1-2 years. Ongoing high  blood pressure should be treated with medicines if weight loss and exercise are not effective.  If you are 67-21 years old, ask your health care provider if you should take aspirin to prevent heart disease.  Diabetes screening involves taking a blood sample to check your fasting blood sugar level. This should be done once every 3 years after age 9 if you are at a normal weight and without risk factors for diabetes. Testing should be considered at a younger age or be carried out more frequently if you are overweight and have at least 1 risk factor for  diabetes.  Colorectal cancer can be detected and often prevented. Most routine colorectal cancer screening begins at the age of 78 and continues through age 38. However, your health care provider may recommend screening at an earlier age if you have risk factors for colon cancer. On a yearly basis, your health care provider may provide home test kits to check for hidden blood in the stool. A small camera at the end of a tube may be used to directly examine the colon (sigmoidoscopy or colonoscopy) to detect the earliest forms of colorectal cancer. Talk to your health care provider about this at age 4 when routine screening begins. A direct exam of the colon should be repeated every 5-10 years through age 18, unless early forms of precancerous polyps or small growths are found.  People who are at an increased risk for hepatitis B should be screened for this virus. You are considered at high risk for hepatitis B if:  You were born in a country where hepatitis B occurs often. Talk with your health care provider about which countries are considered high risk.  Your parents were born in a high-risk country and you have not received a shot to protect against hepatitis B (hepatitis B vaccine).  You have HIV or AIDS.  You use needles to inject street drugs.  You live with, or have sex with, someone who has hepatitis B.  You are a man who has sex with other men (MSM).  You get hemodialysis treatment.  You take certain medicines for conditions like cancer, organ transplantation, and autoimmune conditions.  Hepatitis C blood testing is recommended for all people born from [birth year] through 1963 and any individual with known risk factors for hepatitis C.  Healthy men should no longer receive prostate-specific antigen (PSA) blood tests as part of routine cancer screening. Talk to your health care provider about prostate cancer screening.  Testicular cancer screening is not recommended for adolescents or  adult males who have no symptoms. Screening includes self-exam, a health care provider exam, and other screening tests. Consult with your health care provider about any symptoms you have or any concerns you have about testicular cancer.  Practice safe sex. Use condoms and avoid high-risk sexual practices to reduce the spread of sexually transmitted infections (STIs).  You should be screened for STIs, including gonorrhea and chlamydia if:  You are sexually active and are younger than 24 years.  You are older than 24 years, and your health care provider tells you that you are at risk for this type of infection.  Your sexual activity has changed since you were last screened, and you are at an increased risk for chlamydia or gonorrhea. Ask your health care provider if you are at risk.  If you are at risk of being infected with HIV, it is recommended that you take a prescription medicine daily to prevent HIV infection. This is called  pre-exposure prophylaxis (PrEP). You are considered at risk if:  You are a man who has sex with other men (MSM).  You are a heterosexual man who is sexually active with multiple partners.  You take drugs by injection.  You are sexually active with a partner who has HIV.  Talk with your health care provider about whether you are at high risk of being infected with HIV. If you choose to begin PrEP, you should first be tested for HIV. You should then be tested every 3 months for as long as you are taking PrEP.  Use sunscreen. Apply sunscreen liberally and repeatedly throughout the day. You should seek shade when your shadow is shorter than you. Protect yourself by wearing long sleeves, pants, a wide-brimmed hat, and sunglasses year round whenever you are outdoors.  Tell your health care provider of new moles or changes in moles, especially if there is a change in shape or color. Also, tell your health care provider if a mole is larger than the size of a pencil  eraser.  A one-time screening for abdominal aortic aneurysm (AAA) and surgical repair of large AAAs by ultrasound is recommended for men aged 12-75 years who are current or former smokers.  Stay current with your vaccines (immunizations). Document Released: 09/12/2007 Document Revised: 03/21/2013 Document Reviewed: 08/11/2010 [facility] Patient Information 2015 [place], [place]. This information is not intended to replace advice given to you by your health care provider. Make sure you discuss any questions you have with your health care provider.

## 2014-12-31 NOTE — Progress Notes (Signed)
Pre visit review using our clinic review tool, if applicable. No additional management support is needed unless otherwise documented below in the visit note. 

## 2015-01-07 ENCOUNTER — Emergency Department (HOSPITAL_COMMUNITY)
Admission: EM | Admit: 2015-01-07 | Discharge: 2015-01-07 | Discharge status: Against Medical Advice | Payer: Medicare Other | Attending: Emergency Medicine | Admitting: Emergency Medicine

## 2015-01-07 ENCOUNTER — Encounter (HOSPITAL_COMMUNITY): Payer: Self-pay | Admitting: Adult Health

## 2015-01-07 ENCOUNTER — Emergency Department (INDEPENDENT_AMBULATORY_CARE_PROVIDER_SITE_OTHER)
Admission: EM | Admit: 2015-01-07 | Discharge: 2015-01-07 | Disposition: A | Discharge status: Other Acute Inpatient Hospital | Payer: Medicare Other | Source: Home / Self Care

## 2015-01-07 ENCOUNTER — Encounter (HOSPITAL_COMMUNITY): Payer: Self-pay | Admitting: *Deleted

## 2015-01-07 DIAGNOSIS — R1030 Lower abdominal pain, unspecified: Secondary | ICD-10-CM | POA: Diagnosis not present

## 2015-01-07 LAB — URINALYSIS, ROUTINE W REFLEX MICROSCOPIC
BILIRUBIN URINE: NEGATIVE
GLUCOSE, UA: NEGATIVE mg/dL
HGB URINE DIPSTICK: NEGATIVE
KETONES UR: NEGATIVE mg/dL
Leukocytes, UA: NEGATIVE
Nitrite: NEGATIVE
PROTEIN: NEGATIVE mg/dL
Specific Gravity, Urine: 1.021 (ref 1.005–1.030)
Urobilinogen, UA: 0.2 mg/dL (ref 0.0–1.0)
pH: 5.5 (ref 5.0–8.0)

## 2015-01-07 LAB — COMPREHENSIVE METABOLIC PANEL
ALBUMIN: 3.6 g/dL (ref 3.5–5.0)
ALK PHOS: 78 U/L (ref 38–126)
ALT: 21 U/L (ref 17–63)
AST: 32 U/L (ref 15–41)
Anion gap: 7 (ref 5–15)
BILIRUBIN TOTAL: 0.6 mg/dL (ref 0.3–1.2)
BUN: 15 mg/dL (ref 6–20)
CALCIUM: 9.3 mg/dL (ref 8.9–10.3)
CO2: 27 mmol/L (ref 22–32)
CREATININE: 1.13 mg/dL (ref 0.61–1.24)
Chloride: 104 mmol/L (ref 101–111)
GFR calc Af Amer: >60 mL/min (ref >60)
GFR calc non Af Amer: 58 mL/min — ABNORMAL LOW (ref >60)
GLUCOSE: 82 mg/dL (ref 65–99)
Potassium: 4.3 mmol/L (ref 3.5–5.1)
SODIUM: 138 mmol/L (ref 135–145)
TOTAL PROTEIN: 6.8 g/dL (ref 6.5–8.1)

## 2015-01-07 LAB — CBC
HCT: 41.8 % (ref 39.0–52.0)
Hemoglobin: 14 g/dL (ref 13.0–17.0)
MCH: 29.9 pg (ref 26.0–34.0)
MCHC: 33.5 g/dL (ref 30.0–36.0)
MCV: 89.3 fL (ref 78.0–100.0)
PLATELETS: 187 10*3/uL (ref 150–400)
RBC: 4.68 MIL/uL (ref 4.22–5.81)
RDW: 13.5 % (ref 11.5–15.5)
WBC: 4.7 10*3/uL (ref 4.0–10.5)

## 2015-01-07 NOTE — ED Notes (Addendum)
Presents with lower back pain and lower abdominal pain that began about one week ago-pain described as soreness rated a 8/10 worse with movement. LAst BM this AM and normal. Denies urinary symptoms. Sent over from [facility] to evaluate for a AAA. Denies nausea and vomiting-denies pain a t this time

## 2015-01-07 NOTE — ED Notes (Signed)
Pt  Reports  Low  Back  Pain   With  Abdominal  Pain  X  5   Days      Pt  denys  Any  specefic  Injury  Pt  Is  Awake  Alert  And  Oriented    Has  Not  Passed  Out     The  Pt  Is  Awake  And  Alert   And  Oriented he  Has  Strong   femorial  Pulse    - he    Has  A  History      Of      thyriod   Problems       Takes  A  Low  Dose   Aspirin         He     States  He  Recently  Had  An  Annual  Physical  And  His  Blood  Pressure  Was  good

## 2015-01-07 NOTE — ED Notes (Signed)
Pt called multiple times for room placement with no answer.  

## 2015-01-07 NOTE — ED Provider Notes (Signed)
CSN: [id number]     Arrival date & time 01/07/15  1548 History   None    Chief Complaint  Patient presents with  . Back Pain   (Consider location/radiation/quality/duration/timing/severity/associated sxs/prior Treatment) Patient is a 81 y.o. male presenting with back pain. The history is provided by the patient.  Back Pain Location:  Lumbar spine Quality:  Shooting Radiates to:  Does not radiate Pain severity:  Moderate (8/10) Onset quality:  Unable to specify Duration:  2 days Progression:  Worsening Chronicity:  Chronic (seen by lmd last week but denies pain at visit.) Associated symptoms: abdominal pain and pelvic pain   Associated symptoms: no abdominal swelling, no dysuria, no fever, no numbness, no paresthesias and no weakness     Past Medical History  Diagnosis Date  . BACK PAIN 02/24/2008  . HYPOGONADISM 02/03/2010  . [place], MILD 05/30/2009  . HYPERTROPHY PROSTATE W/UR OBST & OTH LUTS 02/26/2009  . ERECTILE DYSFUNCTION, ORGANIC 05/30/2009  . ELECTROCARDIOGRAM, ABNORMAL 12/08/2006    F/U by a normal stress test on September 2005  . COLONOSCOPY, HX OF 12/28/2005  . History of thyroid nodule     Resolved by [place] 11/2006  . HYPERTHYROIDISM 12/08/2006    [place]  . Adenomatous colon polyp   . Diplopia    Past Surgical History  Procedure Laterality Date  . Colonoscopy     Family History  Problem Relation Age of Onset  . Colon cancer Sister 59  . Diabetes Neg Hx   . Thyroid disease Neg Hx     no goiter or other thyroid problem  . Stomach cancer Neg Hx   . Stroke Other     GP  . Prostate cancer Neg Hx    Social History  Substance Use Topics  . Smoking status: Former Smoker    Types: Cigarettes  . Smokeless tobacco: Never Used     Comment: 1-2 cigarettes per day  . Alcohol Use: Yes     Comment: very rare    Review of Systems  Constitutional: Negative.  Negative for fever.  Gastrointestinal: Positive for abdominal pain. Negative for nausea, vomiting,  diarrhea and constipation.  Genitourinary: Positive for pelvic pain. Negative for dysuria.  Musculoskeletal: Positive for back pain. Negative for gait problem.  Neurological: Negative for weakness, numbness and paresthesias.  All other systems reviewed and are negative.   Allergies  Review of patient's allergies indicates no known allergies.  Home Medications   Prior to Admission medications   Medication Sig Start Date End Date Taking? Authorizing Provider  aspirin 81 MG tablet Take 81 mg by mouth daily.      Historical Provider, MD  cyanocobalamin 1000 MCG tablet Take 100 mcg by mouth daily.      Historical Provider, MD  dorzolamide-timolol (COSOPT) 22.3-6.8 MG/ML ophthalmic solution Place 1 drop into both eyes every 12 (twelve) hours.  10/14/11   Historical Provider, MD  fish oil-omega-3 fatty acids 1000 MG capsule Take 2 g by mouth daily.      Historical Provider, MD  Multiple Vitamin (MULTIVITAMIN) tablet Take 1 tablet by mouth daily.      Historical Provider, MD  Polyvinyl Alcohol-Povidone (REFRESH OP) Apply to eye.    Historical Provider, MD   Meds Ordered and Administered this Visit  Medications - No data to display  There were no vitals taken for this visit. No data found.   Physical Exam  Constitutional: He is oriented to person, place, and time. He appears well-developed and well-nourished. No distress.  Neck: Normal range of motion. Neck supple.  Cardiovascular: Normal rate, regular rhythm, normal heart sounds and intact distal pulses.   Abdominal: Soft. Bowel sounds are normal. He exhibits no distension and no mass. There is tenderness. There is no rebound and no guarding.  Musculoskeletal: He exhibits tenderness.  Lymphadenopathy:    He has no cervical adenopathy.  Neurological: He is alert and oriented to person, place, and time.  Skin: Skin is warm and dry.  Nursing note and vitals reviewed.   ED Course  Procedures (including critical care time)  Labs  Review Labs Reviewed - No data to display  Imaging Review No results found.   Visual Acuity Review  Right Eye Distance:   Left Eye Distance:   Bilateral Distance:    Right Eye Near:   Left Eye Near:    Bilateral Near:         MDM   1. Lower abdominal pain    Sent for eval of possible AAA causing back pain and lower abd pain. Pain 8/10, poor historian.    Billy Fischer, MD 01/07/15 [phone]

## 2015-01-08 ENCOUNTER — Encounter (HOSPITAL_COMMUNITY): Payer: Self-pay | Admitting: Emergency Medicine

## 2015-01-08 ENCOUNTER — Emergency Department (HOSPITAL_COMMUNITY)
Admission: EM | Admit: 2015-01-08 | Discharge: 2015-01-08 | Disposition: A | Discharge status: Home / Self Care | Payer: Medicare Other | Attending: Emergency Medicine | Admitting: Emergency Medicine

## 2015-01-08 ENCOUNTER — Emergency Department (HOSPITAL_COMMUNITY): Payer: Medicare Other

## 2015-01-08 DIAGNOSIS — Z87438 Personal history of other diseases of male genital organs: Secondary | ICD-10-CM | POA: Diagnosis not present

## 2015-01-08 DIAGNOSIS — R109 Unspecified abdominal pain: Secondary | ICD-10-CM | POA: Diagnosis not present

## 2015-01-08 DIAGNOSIS — Z87891 Personal history of nicotine dependence: Secondary | ICD-10-CM | POA: Diagnosis not present

## 2015-01-08 DIAGNOSIS — Z8669 Personal history of other diseases of the nervous system and sense organs: Secondary | ICD-10-CM | POA: Diagnosis not present

## 2015-01-08 DIAGNOSIS — Z86010 Personal history of colonic polyps: Secondary | ICD-10-CM | POA: Insufficient documentation

## 2015-01-08 DIAGNOSIS — Z79899 Other long term (current) drug therapy: Secondary | ICD-10-CM | POA: Diagnosis not present

## 2015-01-08 DIAGNOSIS — M545 Low back pain, unspecified: Secondary | ICD-10-CM

## 2015-01-08 DIAGNOSIS — R1030 Lower abdominal pain, unspecified: Secondary | ICD-10-CM | POA: Insufficient documentation

## 2015-01-08 DIAGNOSIS — E785 Hyperlipidemia, unspecified: Secondary | ICD-10-CM | POA: Diagnosis not present

## 2015-01-08 DIAGNOSIS — Z7982 Long term (current) use of aspirin: Secondary | ICD-10-CM | POA: Insufficient documentation

## 2015-01-08 LAB — COMPREHENSIVE METABOLIC PANEL
ALT: 20 U/L (ref 17–63)
AST: 41 U/L (ref 15–41)
Albumin: 4.1 g/dL (ref 3.5–5.0)
Alkaline Phosphatase: 83 U/L (ref 38–126)
Anion gap: 7 (ref 5–15)
BUN: 13 mg/dL (ref 6–20)
CHLORIDE: 101 mmol/L (ref 101–111)
CO2: 27 mmol/L (ref 22–32)
CREATININE: 1.07 mg/dL (ref 0.61–1.24)
Calcium: 9.2 mg/dL (ref 8.9–10.3)
Glucose, Bld: 95 mg/dL (ref 65–99)
POTASSIUM: 4.8 mmol/L (ref 3.5–5.1)
SODIUM: 135 mmol/L (ref 135–145)
Total Bilirubin: 1 mg/dL (ref 0.3–1.2)
Total Protein: 7.1 g/dL (ref 6.5–8.1)

## 2015-01-08 LAB — CBC WITH DIFFERENTIAL/PLATELET
BASOS ABS: 0 10*3/uL (ref 0.0–0.1)
Basophils Relative: 1 %
EOS ABS: 0.2 10*3/uL (ref 0.0–0.7)
EOS PCT: 6 %
HCT: 43.6 % (ref 39.0–52.0)
Hemoglobin: 14.3 g/dL (ref 13.0–17.0)
Lymphocytes Relative: 36 %
Lymphs Abs: 1.4 10*3/uL (ref 0.7–4.0)
MCH: 29.6 pg (ref 26.0–34.0)
MCHC: 32.8 g/dL (ref 30.0–36.0)
MCV: 90.3 fL (ref 78.0–100.0)
MONO ABS: 0.5 10*3/uL (ref 0.1–1.0)
Monocytes Relative: 12 %
Neutro Abs: 1.7 10*3/uL (ref 1.7–7.7)
Neutrophils Relative %: 45 %
PLATELETS: 174 10*3/uL (ref 150–400)
RBC: 4.83 MIL/uL (ref 4.22–5.81)
RDW: 13.5 % (ref 11.5–15.5)
WBC: 3.8 10*3/uL — AB (ref 4.0–10.5)

## 2015-01-08 LAB — URINALYSIS, ROUTINE W REFLEX MICROSCOPIC
Bilirubin Urine: NEGATIVE
Glucose, UA: NEGATIVE mg/dL
Hgb urine dipstick: NEGATIVE
KETONES UR: NEGATIVE mg/dL
LEUKOCYTES UA: NEGATIVE
NITRITE: NEGATIVE
PROTEIN: NEGATIVE mg/dL
Specific Gravity, Urine: 1.009 (ref 1.005–1.030)
Urobilinogen, UA: 0.2 mg/dL (ref 0.0–1.0)
pH: 6.5 (ref 5.0–8.0)

## 2015-01-08 LAB — LIPASE, BLOOD: LIPASE: 57 U/L — AB (ref 22–51)

## 2015-01-08 MED ORDER — IOHEXOL 300 MG/ML  SOLN
25.0000 mL | Freq: Once | INTRAMUSCULAR | Status: AC | PRN
  Administered 2015-01-08: 25 mL via ORAL

## 2015-01-08 MED ORDER — SODIUM CHLORIDE 0.9 % IV BOLUS (SEPSIS)
1000.0000 mL | Freq: Once | INTRAVENOUS | Status: AC
  Administered 2015-01-08: 1000 mL via INTRAVENOUS

## 2015-01-08 MED ORDER — DIAZEPAM 2 MG PO TABS: 2.0000 mg | ORAL_TABLET | Freq: Four times a day (QID) | ORAL | Status: DC | PRN

## 2015-01-08 MED ORDER — IOHEXOL 300 MG/ML  SOLN
100.0000 mL | Freq: Once | INTRAMUSCULAR | Status: AC | PRN
Start: 2015-01-08 — End: 2015-01-08
  Administered 2015-01-08: 100 mL via INTRAVENOUS

## 2015-01-08 NOTE — ED Notes (Signed)
Patient transported to CT 

## 2015-01-08 NOTE — ED Notes (Signed)
MD at bedside. 

## 2015-01-08 NOTE — ED Provider Notes (Signed)
CSN: [id number]     Arrival date & time 01/08/15  1113 History   First MD Initiated Contact with Patient 01/08/15 1123     Chief Complaint  Patient presents with  . Abdominal Pain  . Back Pain     (Consider location/radiation/quality/duration/timing/severity/associated sxs/prior Treatment) Patient is a 81 y.o. male presenting with abdominal pain and back pain.  Abdominal Pain Pain location: suprapubic. Pain quality: aching   Pain radiates to:  Does not radiate Pain severity:  Moderate Onset quality:  Gradual Duration:  1 week Timing:  Constant Progression:  Unchanged Chronicity:  New Context comment:  Spontaneous Relieved by:  Nothing Back Pain Associated symptoms: abdominal pain     Past Medical History  Diagnosis Date  . BACK PAIN 02/24/2008  . HYPOGONADISM 02/03/2010  . [place], MILD 05/30/2009  . HYPERTROPHY PROSTATE W/UR OBST & OTH LUTS 02/26/2009  . ERECTILE DYSFUNCTION, ORGANIC 05/30/2009  . ELECTROCARDIOGRAM, ABNORMAL 12/08/2006    F/U by a normal stress test on September 2005  . COLONOSCOPY, HX OF 12/28/2005  . History of thyroid nodule     Resolved by [place] 11/2006  . HYPERTHYROIDISM 12/08/2006    [place]  . Adenomatous colon polyp   . Diplopia    Past Surgical History  Procedure Laterality Date  . Colonoscopy     Family History  Problem Relation Age of Onset  . Colon cancer Sister 33  . Diabetes Neg Hx   . Thyroid disease Neg Hx     no goiter or other thyroid problem  . Stomach cancer Neg Hx   . Stroke Other     GP  . Prostate cancer Neg Hx    Social History  Substance Use Topics  . Smoking status: Former Smoker    Types: Cigarettes  . Smokeless tobacco: Never Used     Comment: 1-2 cigarettes per day  . Alcohol Use: Yes     Comment: very rare    Review of Systems  Gastrointestinal: Positive for abdominal pain.  Musculoskeletal: Positive for back pain.  All other systems reviewed and are negative.     Allergies  Review of patient's  allergies indicates no known allergies.  Home Medications   Prior to Admission medications   Medication Sig Start Date End Date Taking? Authorizing Provider  aspirin 81 MG tablet Take 81 mg by mouth daily.      Historical Provider, MD  cyanocobalamin 1000 MCG tablet Take 100 mcg by mouth daily.      Historical Provider, MD  diazepam (VALIUM) 2 MG tablet Take 1 tablet (2 mg total) by mouth every 6 (six) hours as needed (spasms). 01/08/15   Debby Freiberg, MD  dorzolamide-timolol (COSOPT) 22.3-6.8 MG/ML ophthalmic solution Place 1 drop into both eyes every 12 (twelve) hours.  10/14/11   Historical Provider, MD  fish oil-omega-3 fatty acids 1000 MG capsule Take 2 g by mouth daily.      Historical Provider, MD  Multiple Vitamin (MULTIVITAMIN) tablet Take 1 tablet by mouth daily.      Historical Provider, MD  Polyvinyl Alcohol-Povidone (REFRESH OP) Apply to eye.    Historical Provider, MD   BP 141/100 mmHg  Pulse 57  Temp(Src) 98.2 F (36.8 C) (Oral)  Resp 18  SpO2 100% Physical Exam  Constitutional: He is oriented to person, place, and time. He appears well-developed and well-nourished.  HENT:  Head: Normocephalic and atraumatic.  Eyes: Conjunctivae and EOM are normal.  Neck: Normal range of motion. Neck supple.  Cardiovascular: Normal  rate, regular rhythm and normal heart sounds.   Pulmonary/Chest: Effort normal and breath sounds normal. No respiratory distress.  Abdominal: He exhibits no distension. There is no tenderness. There is no rebound and no guarding. Hernia confirmed negative in the right inguinal area and confirmed negative in the left inguinal area.  Genitourinary: Right testis shows no tenderness. Left testis shows no tenderness.  Musculoskeletal: Normal range of motion.  Lymphadenopathy:       Right: No inguinal adenopathy present.       Left: No inguinal adenopathy present.  Neurological: He is alert and oriented to person, place, and time.  Skin: Skin is warm and dry.   Vitals reviewed.   ED Course  Procedures (including critical care time) Labs Review Labs Reviewed  CBC WITH DIFFERENTIAL/PLATELET - Abnormal; Notable for the following:    WBC 3.8 (*)    All other components within normal limits  LIPASE, BLOOD - Abnormal; Notable for the following:    Lipase 57 (*)    All other components within normal limits  COMPREHENSIVE METABOLIC PANEL  URINALYSIS, ROUTINE W REFLEX MICROSCOPIC (NOT AT [facility])    Imaging Review No results found. I have personally reviewed and evaluated these images and lab results as part of my medical decision-making.   EKG Interpretation None      MDM   Final diagnoses:  Midline low back pain without sciatica    81 y.o. male with pertinent PMH of prior back pain, hypogonadism with intermittent L testicular pain presents with atraumatic back pain with associated abd pain.  No clear exacerbating factors for either, exam not consistent with isolated MSK strain, no back tenderness.  CT scan ordered.    This was unremarkable.  DC home in stable condition, likely radiculopathy. Standard return precautions given.  I have reviewed all laboratory and imaging studies if ordered as above  1. Midline low back pain without sciatica         Debby Freiberg, MD 01/12/15 [phone]

## 2015-01-08 NOTE — ED Notes (Signed)
Pt c/o lower abd pain and back pain; pt seen here yesterday for same but left before he was seen

## 2015-01-08 NOTE — Discharge Instructions (Signed)

## 2015-01-21 ENCOUNTER — Encounter: Payer: Self-pay | Admitting: Internal Medicine

## 2015-01-21 ENCOUNTER — Ambulatory Visit (INDEPENDENT_AMBULATORY_CARE_PROVIDER_SITE_OTHER): Payer: Medicare Other | Admitting: Internal Medicine

## 2015-01-21 VITALS — BP 120/84 | HR 79 | Temp 98.1°F | Resp 16 | Ht 70.0 in | Wt 156.0 lb

## 2015-01-21 DIAGNOSIS — M545 Low back pain, unspecified: Secondary | ICD-10-CM

## 2015-01-21 MED ORDER — DIAZEPAM 2 MG PO TABS: 2.0000 mg | ORAL_TABLET | Freq: Four times a day (QID) | ORAL | Status: DC | PRN

## 2015-01-21 NOTE — Progress Notes (Signed)
Pre visit review using our clinic review tool, if applicable. No additional management support is needed unless otherwise documented below in the visit note. 

## 2015-01-21 NOTE — Patient Instructions (Addendum)
Look into the insurance to see if they will cover the silver sneakers (free gym membership). If they do you should be able to go to any gym for free.   Doing some exercise to help with balance would be tai chi or yoga (they even do chair yoga if you feel your balance is not that good).   We have sent in the renewal of the valium to the pharmacy in case the pain gets worse or comes back.

## 2015-01-22 DIAGNOSIS — M549 Dorsalgia, unspecified: Secondary | ICD-10-CM | POA: Insufficient documentation

## 2015-01-22 NOTE — Assessment & Plan Note (Signed)
Likely musculoskeletal and talked to him about stretching and injury prevention. Rx for valium to be use sparingly for flares. He understands that it is not be used routinely and not to drive after taking.

## 2015-01-22 NOTE — Progress Notes (Signed)
   Subjective:    Patient ID: Tommy Browning, male    DOB: [birth year], 81 y.o.   MRN: [id number]  HPI The patient is an 81 YO man coming in for ER follow up. He was seen for back and low stomach pain. Initially urgent care was concerned about AAA and sent him to the ER for imaging. CT abdomen did not reveal an acute cause. It was felt to be musculoskeletal and he was given small rx for valium. This was very helpful and he is feeling about 60% better. He has had trouble with it in the past. No real exacerbation that he has seen and no new problems.   Review of Systems  Constitutional: Negative for fever, activity change, appetite change and fatigue.  HENT: Negative.   Eyes: Positive for visual disturbance.  Respiratory: Negative for cough, chest tightness, shortness of breath and wheezing.   Cardiovascular: Negative for chest pain, palpitations and leg swelling.  Gastrointestinal: Negative for abdominal pain, diarrhea, constipation and abdominal distention.  Endocrine: Negative.   Musculoskeletal: Positive for myalgias and back pain. Negative for arthralgias.  Skin: Negative.   Allergic/Immunologic: Negative.   Neurological: Negative for dizziness, weakness, light-headedness and headaches.      Objective:   Physical Exam  Constitutional: He is oriented to person, place, and time. He appears well-developed and well-nourished.  HENT:  Head: Normocephalic and atraumatic.  Neck: Normal range of motion.  Cardiovascular: Normal rate and regular rhythm.   Pulmonary/Chest: Effort normal and breath sounds normal. No respiratory distress. He has no wheezes. He has no rales.  Abdominal: Soft. Bowel sounds are normal. He exhibits no distension. There is no tenderness. There is no rebound.  Neurological: He is alert and oriented to person, place, and time.  Skin: Skin is warm and dry.   Filed Vitals:   01/21/15 1556  BP: 120/84  Pulse: 79  Temp: 98.1 F (36.7 C)  TempSrc: Oral  Resp: 16    Height: 5\' 10"  (1.778 m)  Weight: 156 lb (70.761 kg)  SpO2: 96%      Assessment & Plan:

## 2015-02-12 ENCOUNTER — Encounter: Payer: Self-pay | Admitting: Gastroenterology

## 2015-02-27 ENCOUNTER — Encounter (HOSPITAL_COMMUNITY): Payer: Self-pay | Admitting: Emergency Medicine

## 2015-02-27 ENCOUNTER — Emergency Department (INDEPENDENT_AMBULATORY_CARE_PROVIDER_SITE_OTHER)
Admission: EM | Admit: 2015-02-27 | Discharge: 2015-02-27 | Disposition: A | Discharge status: Home / Self Care | Payer: Medicare Other | Source: Home / Self Care | Attending: Emergency Medicine | Admitting: Emergency Medicine

## 2015-02-27 DIAGNOSIS — E860 Dehydration: Secondary | ICD-10-CM

## 2015-02-27 DIAGNOSIS — K529 Noninfective gastroenteritis and colitis, unspecified: Secondary | ICD-10-CM | POA: Diagnosis not present

## 2015-02-27 LAB — POCT I-STAT, CHEM 8
BUN: 21 mg/dL — AB (ref 6–20)
CREATININE: 1.5 mg/dL — AB (ref 0.61–1.24)
Calcium, Ion: 1.2 mmol/L (ref 1.13–1.30)
Chloride: 103 mmol/L (ref 101–111)
Glucose, Bld: 95 mg/dL (ref 65–99)
HEMATOCRIT: 46 % (ref 39.0–52.0)
Hemoglobin: 15.6 g/dL (ref 13.0–17.0)
POTASSIUM: 3.7 mmol/L (ref 3.5–5.1)
SODIUM: 141 mmol/L (ref 135–145)
TCO2: 24 mmol/L (ref 0–100)

## 2015-02-27 MED ORDER — SODIUM CHLORIDE 0.9 % IV BOLUS (SEPSIS)
1000.0000 mL | Freq: Once | INTRAVENOUS | Status: AC
  Administered 2015-02-27: 1000 mL via INTRAVENOUS

## 2015-02-27 MED ORDER — ONDANSETRON HCL 4 MG/2ML IJ SOLN
INTRAMUSCULAR | Status: AC
Start: 2015-02-27 — End: 2015-02-27
  Filled 2015-02-27: qty 2

## 2015-02-27 MED ORDER — ONDANSETRON HCL 4 MG PO TABS: 4.0000 mg | ORAL_TABLET | Freq: Three times a day (TID) | ORAL | Status: DC | PRN

## 2015-02-27 MED ORDER — ONDANSETRON HCL 4 MG/2ML IJ SOLN
4.0000 mg | Freq: Once | INTRAMUSCULAR | Status: AC
  Administered 2015-02-27: 4 mg via INTRAVENOUS

## 2015-02-27 NOTE — ED Provider Notes (Signed)
CSN: [id number]     Arrival date & time 02/27/15  1312 History   First MD Initiated Contact with Patient 02/27/15 1409     Chief Complaint  Patient presents with  . Nausea  . Diarrhea  . Dizziness   (Consider location/radiation/quality/duration/timing/severity/associated sxs/prior Treatment) HPI  He is an 81 year old man here with his wife for evaluation of nausea and diarrhea. He starts his symptoms started 2 days ago. He reports nausea.  His wife states he had one episode of vomiting. He also reports dark watery diarrhea. He has been taking Pepto-Bismol. He denies any abdominal pain. No documented fever, but he does state he has hot and cold chills. He is feeling dizzy, particularly when he stands up. His mouth does feel dry.  Past Medical History  Diagnosis Date  . BACK PAIN 02/24/2008  . HYPOGONADISM 02/03/2010  . [place], MILD 05/30/2009  . HYPERTROPHY PROSTATE W/UR OBST & OTH LUTS 02/26/2009  . ERECTILE DYSFUNCTION, ORGANIC 05/30/2009  . ELECTROCARDIOGRAM, ABNORMAL 12/08/2006    F/U by a normal stress test on September 2005  . COLONOSCOPY, HX OF 12/28/2005  . History of thyroid nodule     Resolved by [place] 11/2006  . HYPERTHYROIDISM 12/08/2006    [place]  . Adenomatous colon polyp   . Diplopia    Past Surgical History  Procedure Laterality Date  . Colonoscopy     Family History  Problem Relation Age of Onset  . Colon cancer Sister 21  . Diabetes Neg Hx   . Thyroid disease Neg Hx     no goiter or other thyroid problem  . Stomach cancer Neg Hx   . Stroke Other     GP  . Prostate cancer Neg Hx    Social History  Substance Use Topics  . Smoking status: Former Smoker    Types: Cigarettes  . Smokeless tobacco: Never Used     Comment: 1-2 cigarettes per day  . Alcohol Use: Yes     Comment: very rare    Review of Systems As in history of present illness Allergies  Review of patient's allergies indicates no known allergies.  Home Medications   Prior to Admission  medications   Medication Sig Start Date End Date Taking? Authorizing Provider  aspirin 81 MG tablet Take 81 mg by mouth daily.      Historical Provider, MD  cyanocobalamin 1000 MCG tablet Take 100 mcg by mouth daily.      Historical Provider, MD  diazepam (VALIUM) 2 MG tablet Take 1 tablet (2 mg total) by mouth every 6 (six) hours as needed (spasms). 01/21/15   Hoyt Koch, MD  dorzolamide-timolol (COSOPT) 22.3-6.8 MG/ML ophthalmic solution Place 1 drop into both eyes every 12 (twelve) hours.  10/14/11   Historical Provider, MD  fish oil-omega-3 fatty acids 1000 MG capsule Take 2 g by mouth daily.      Historical Provider, MD  Multiple Vitamin (MULTIVITAMIN) tablet Take 1 tablet by mouth daily.      Historical Provider, MD  ondansetron (ZOFRAN) 4 MG tablet Take 1 tablet (4 mg total) by mouth every 8 (eight) hours as needed for nausea or vomiting. 02/27/15   Melony Overly, MD  Polyvinyl Alcohol-Povidone (REFRESH OP) Apply to eye.    Historical Provider, MD   Meds Ordered and Administered this Visit   Medications  ondansetron (ZOFRAN) injection 4 mg (4 mg Intravenous Given 02/27/15 1452)  sodium chloride 0.9 % bolus 1,000 mL (1,000 mLs Intravenous Given 02/27/15 1446)  Pulse 90  Temp(Src) 97.9 F (36.6 C) (Oral)  Resp 18  SpO2 100% Orthostatic VS for the past 24 hrs:  BP- Lying Pulse- Lying BP- Sitting Pulse- Sitting BP- Standing at 0 minutes Pulse- Standing at 0 minutes  02/27/15 1538 107/73 mmHg 99 103/78 mmHg 81 - -  02/27/15 1426 117/90 mmHg 90 106/73 mmHg 95 (!) 87/68 mmHg 112    Physical Exam  Constitutional: He is oriented to person, place, and time. He appears well-developed and well-nourished.  HENT:  Dry mucous membranes  Neck: Neck supple.  Cardiovascular: Normal rate, regular rhythm and normal heart sounds.   No murmur heard. Pulmonary/Chest: Effort normal and breath sounds normal. No respiratory distress. He has no wheezes. He has no rales.  Abdominal: Soft.  He exhibits no distension. There is no tenderness. There is no rebound and no guarding.  Neurological: He is alert and oriented to person, place, and time.  Skin: Skin is warm and dry.    ED Course  Procedures (including critical care time) ED ECG REPORT   Date: 02/27/2015  Rate: 75  Rhythm: normal sinus rhythm  QRS Axis: normal  Intervals: normal  ST/T Wave abnormalities: nonspecific T-wave changes  Conduction Disutrbances:right bundle branch block  Narrative Interpretation: NSR, RBBB, nonspecific t-wave changes; no significant change from prior ekg  Old EKG Reviewed: unchanged  I have personally reviewed the EKG tracing and agree with the computerized printout as noted.  Labs Review Labs Reviewed  POCT I-STAT, CHEM 8 - Abnormal; Notable for the following:    BUN 21 (*)    Creatinine, Ser 1.50 (*)    All other components within normal limits    Imaging Review No results found.    MDM   1. Gastroenteritis   2. Dehydration    He is feeling better after the Zofran and liter bolus.  Repeat orthostatics have stabilized. He does not have any blood in the stool or abdominal pain to suggest diverticulitis. He does not have any fevers. Will discharge home with Zofran and instructions to drink lots of clear liquids. For any new or worsening symptoms he is to go to the emergency room.    Melony Overly, MD 02/27/15 [phone]

## 2015-02-27 NOTE — ED Notes (Signed)
The patient presented to the [facility] with a complaint of N/V/D x 3 days. 

## 2015-02-27 NOTE — Discharge Instructions (Signed)
You have a stomach bug. Use the Zofran every 8 hours as needed for nausea. Make sure you are drinking lots of liquids.  Stick with clear liquids until tomorrow. He can have chicken broth, Jell-O, water, clear sodas, Gatorade. If you develop any new symptoms, you see blood in your stool, you develop abdominal pain, you are getting really dizzy when you stand up, please go to the emergency room.

## 2015-03-07 ENCOUNTER — Inpatient Hospital Stay (HOSPITAL_COMMUNITY)
Admission: EM | Admit: 2015-03-07 | Discharge: 2015-03-09 | DRG: 316 | Disposition: A | Discharge status: Home / Self Care | Payer: Medicare Other | Attending: Internal Medicine | Admitting: Internal Medicine

## 2015-03-07 ENCOUNTER — Emergency Department (HOSPITAL_COMMUNITY): Payer: Medicare Other

## 2015-03-07 ENCOUNTER — Encounter (HOSPITAL_COMMUNITY): Payer: Self-pay | Admitting: Emergency Medicine

## 2015-03-07 DIAGNOSIS — K640 First degree hemorrhoids: Secondary | ICD-10-CM | POA: Diagnosis present

## 2015-03-07 DIAGNOSIS — E785 Hyperlipidemia, unspecified: Secondary | ICD-10-CM | POA: Diagnosis present

## 2015-03-07 DIAGNOSIS — N400 Enlarged prostate without lower urinary tract symptoms: Secondary | ICD-10-CM | POA: Diagnosis present

## 2015-03-07 DIAGNOSIS — R0602 Shortness of breath: Secondary | ICD-10-CM | POA: Diagnosis not present

## 2015-03-07 DIAGNOSIS — E0590 Thyrotoxicosis, unspecified without thyrotoxic crisis or storm: Secondary | ICD-10-CM | POA: Diagnosis present

## 2015-03-07 DIAGNOSIS — Z79899 Other long term (current) drug therapy: Secondary | ICD-10-CM

## 2015-03-07 DIAGNOSIS — I959 Hypotension, unspecified: Secondary | ICD-10-CM | POA: Diagnosis not present

## 2015-03-07 DIAGNOSIS — R1030 Lower abdominal pain, unspecified: Secondary | ICD-10-CM | POA: Diagnosis not present

## 2015-03-07 DIAGNOSIS — E291 Testicular hypofunction: Secondary | ICD-10-CM | POA: Diagnosis present

## 2015-03-07 DIAGNOSIS — Z87891 Personal history of nicotine dependence: Secondary | ICD-10-CM

## 2015-03-07 DIAGNOSIS — K6289 Other specified diseases of anus and rectum: Secondary | ICD-10-CM | POA: Diagnosis present

## 2015-03-07 DIAGNOSIS — Z86010 Personal history of colonic polyps: Secondary | ICD-10-CM

## 2015-03-07 DIAGNOSIS — K5730 Diverticulosis of large intestine without perforation or abscess without bleeding: Secondary | ICD-10-CM | POA: Diagnosis present

## 2015-03-07 DIAGNOSIS — K5900 Constipation, unspecified: Secondary | ICD-10-CM | POA: Diagnosis present

## 2015-03-07 DIAGNOSIS — Z7982 Long term (current) use of aspirin: Secondary | ICD-10-CM

## 2015-03-07 DIAGNOSIS — E039 Hypothyroidism, unspecified: Secondary | ICD-10-CM | POA: Diagnosis present

## 2015-03-07 DIAGNOSIS — D649 Anemia, unspecified: Secondary | ICD-10-CM | POA: Diagnosis present

## 2015-03-07 DIAGNOSIS — R197 Diarrhea, unspecified: Secondary | ICD-10-CM | POA: Diagnosis not present

## 2015-03-07 DIAGNOSIS — R933 Abnormal findings on diagnostic imaging of other parts of digestive tract: Secondary | ICD-10-CM | POA: Diagnosis not present

## 2015-03-07 LAB — URINALYSIS, ROUTINE W REFLEX MICROSCOPIC
Glucose, UA: NEGATIVE mg/dL
Hgb urine dipstick: NEGATIVE
Ketones, ur: 15 mg/dL — AB
Leukocytes, UA: NEGATIVE
NITRITE: NEGATIVE
Protein, ur: NEGATIVE mg/dL
SPECIFIC GRAVITY, URINE: 1.013 (ref 1.005–1.030)
pH: 5 (ref 5.0–8.0)

## 2015-03-07 LAB — COMPREHENSIVE METABOLIC PANEL
ALK PHOS: 84 U/L (ref 38–126)
ALT: 22 U/L (ref 17–63)
ANION GAP: 14 (ref 5–15)
AST: 38 U/L (ref 15–41)
Albumin: 3.8 g/dL (ref 3.5–5.0)
BILIRUBIN TOTAL: 1 mg/dL (ref 0.3–1.2)
BUN: 13 mg/dL (ref 6–20)
CALCIUM: 9.1 mg/dL (ref 8.9–10.3)
CO2: 23 mmol/L (ref 22–32)
Chloride: 102 mmol/L (ref 101–111)
Creatinine, Ser: 1.36 mg/dL — ABNORMAL HIGH (ref 0.61–1.24)
GFR, EST AFRICAN AMERICAN: 54 mL/min — AB (ref >60)
GFR, EST NON AFRICAN AMERICAN: 46 mL/min — AB (ref >60)
Glucose, Bld: 138 mg/dL — ABNORMAL HIGH (ref 65–99)
Potassium: 3.7 mmol/L (ref 3.5–5.1)
Sodium: 139 mmol/L (ref 135–145)
TOTAL PROTEIN: 6.7 g/dL (ref 6.5–8.1)

## 2015-03-07 LAB — I-STAT CG4 LACTIC ACID, ED: LACTIC ACID, VENOUS: 1.87 mmol/L (ref 0.5–2.0)

## 2015-03-07 LAB — CBC
HCT: 40.8 % (ref 39.0–52.0)
HEMOGLOBIN: 13.8 g/dL (ref 13.0–17.0)
MCH: 29.7 pg (ref 26.0–34.0)
MCHC: 33.8 g/dL (ref 30.0–36.0)
MCV: 87.7 fL (ref 78.0–100.0)
Platelets: 214 10*3/uL (ref 150–400)
RBC: 4.65 MIL/uL (ref 4.22–5.81)
RDW: 13.2 % (ref 11.5–15.5)
WBC: 13.9 10*3/uL — ABNORMAL HIGH (ref 4.0–10.5)

## 2015-03-07 LAB — LIPASE, BLOOD: Lipase: 114 U/L — ABNORMAL HIGH (ref 11–51)

## 2015-03-07 MED ORDER — IOHEXOL 300 MG/ML  SOLN
100.0000 mL | Freq: Once | INTRAMUSCULAR | Status: AC | PRN
  Administered 2015-03-07: 100 mL via INTRAVENOUS

## 2015-03-07 MED ORDER — ENOXAPARIN SODIUM 40 MG/0.4ML ~~LOC~~ SOLN
40.0000 mg | Freq: Every day | SUBCUTANEOUS | Status: DC
  Administered 2015-03-08 – 2015-03-09 (×2): 40 mg via SUBCUTANEOUS
  Filled 2015-03-07 (×2): qty 0.4

## 2015-03-07 MED ORDER — ONDANSETRON HCL 4 MG PO TABS: 4.0000 mg | ORAL_TABLET | Freq: Four times a day (QID) | ORAL | Status: DC | PRN

## 2015-03-07 MED ORDER — ASPIRIN EC 81 MG PO TBEC
81.0000 mg | DELAYED_RELEASE_TABLET | Freq: Every day | ORAL | Status: DC
  Administered 2015-03-08 – 2015-03-09 (×2): 81 mg via ORAL
  Filled 2015-03-07 (×2): qty 1

## 2015-03-07 MED ORDER — CIPROFLOXACIN IN D5W 400 MG/200ML IV SOLN
400.0000 mg | Freq: Once | INTRAVENOUS | Status: AC
  Administered 2015-03-07: 400 mg via INTRAVENOUS
  Filled 2015-03-07: qty 200

## 2015-03-07 MED ORDER — ACETAMINOPHEN 650 MG RE SUPP: 650.0000 mg | Freq: Four times a day (QID) | RECTAL | Status: DC | PRN

## 2015-03-07 MED ORDER — SODIUM CHLORIDE 0.9 % IV BOLUS (SEPSIS)
1000.0000 mL | Freq: Once | INTRAVENOUS | Status: AC
  Administered 2015-03-07: 1000 mL via INTRAVENOUS

## 2015-03-07 MED ORDER — METRONIDAZOLE IN NACL 5-0.79 MG/ML-% IV SOLN
500.0000 mg | Freq: Once | INTRAVENOUS | Status: AC
  Administered 2015-03-07: 500 mg via INTRAVENOUS
  Filled 2015-03-07: qty 100

## 2015-03-07 MED ORDER — ONDANSETRON HCL 4 MG/2ML IJ SOLN: 4.0000 mg | Freq: Four times a day (QID) | INTRAMUSCULAR | Status: DC | PRN

## 2015-03-07 MED ORDER — SODIUM CHLORIDE 0.9 % IV SOLN
INTRAVENOUS | Status: DC
  Administered 2015-03-08 – 2015-03-09 (×4): via INTRAVENOUS

## 2015-03-07 MED ORDER — ACETAMINOPHEN 325 MG PO TABS: 650.0000 mg | ORAL_TABLET | Freq: Four times a day (QID) | ORAL | Status: DC | PRN

## 2015-03-07 NOTE — H&P (Addendum)
History and Physical  Patient Name: Tommy Browning     [id number]    DOB: 1929/12/12    DOA: 03/07/2015 Referring physician: Gloriann Loan, PA-C PCP: Hoyt Koch, MD      Chief Complaint: Diarrhea and abdominal pain  HPI: Tommy Browning is a 81 y.o. male with a past medical history significant for hyperthyroidism currently off meds and hypogonadism previously on testosterone, and tubulovillous adenoma years ago who presents with diarrhea and abdominal pain.  The patient first developed lower abdominal pain, intermittently about two months ago, his wife thinks, and ER notes corroborate.  He had a normal CT with contrast and was discharged with cyclobenzaprine.  This pain recurred 2 weeks ago, and he went to the ER last week, had AKI and orthostasis, but was given IV fluids and discharged.  Today, he had another 24 hours of several episodes of diarrhea, non-bloody at all, and lower abdominal pain and came to the ER.  In the ED, the patient was tachycardic and hypotensive to 90/50 mmHg, but fluid responsive.  He had leukocytosis and a CT with contrast showed no pancreatitis or diverticulitis but an area of rectum with enhancement, suggestive of proctitis.  He was administered ciprofloxacin and flagyl and TR[facility] were asked to admit.     Review of Systems:  Pt complains of abdominal pain, diarrhea. Pt denies any hematochezia, melena, hematemesis, rash, joint pains.  All other systems negative except as just noted or noted in the history of present illness.   Allergies: No Known Allergies  Home medications: .  aspirin 81 MG tablet, Take 81 mg by mouth daily.  , Disp: , Rfl:     Past medical history: 1. Hypogonadism 2. Hyperthyroidism, previously on Methimazole 3. History of villous adenoma, removed by [facility], frequent colonoscopy, I do not see records of actual colon cancer, and he does not report it  Past surgical history: None  Family history:  family history includes Colon  cancer (age of onset: 61) in his sister; Stroke in his other. There is no history of Diabetes, Thyroid disease, Stomach cancer, or Prostate cancer.  Family history of SLE in cousin, he thinks.  No family history of IBD.  Social History:  Patient lives with his wife.  He is a remote former smoker.  He is independent with all ADLs and IADLs and does not use a cane or walker.        Physical Exam: BP 103/68 mmHg  Pulse 82  Temp(Src) 98.3 F (36.8 C) (Oral)  Resp 18  Ht 5\' 7"  (1.702 m)  Wt 70.308 kg (155 lb)  BMI 24.27 kg/m2  SpO2 98% General appearance: Well-developed, adult male, alert and in no acute distress.   Eyes: Anicteric, lids and lashes normal.     ENT: No nasal deformity, discharge, or epistaxis.  OP moist without lesions.   Lymph: No cervical lymphadenopathy. Skin: Warm and dry.  No suspicious rashes or lesions ion neck, face, back, trunk, arms, legs. Cardiac: RRR, nl S1-S2, no murmurs appreciated.   Respiratory: Normal respiratory rate and rhythm.  CTAB. Abdomen: Abdomen soft without rigidity.  No TTP in epigastrium or even in pelvis, mild bladder tenderness. No ascites, distension.   MSK: No deformities or effusions. Neuro: Sensorium intact and responding to questions, attention normal.  Speech is fluent.  Moves all extremities equally and with normal coordination.    Psych: Behavior appropriate.  Affect blunted.  No evidence of aural or visual hallucinations or delusions.  Labs on Admission:  The metabolic panel shows normal sodium, potassium, bicarbonate. The serum creatinine is elevated to 1.36 g/dL from a baseline of 1.07 mg/dL The complete blood count shows leukocytosis. The urinalysis shows ketones. The lipase is elevated. The lactic acid level is normal.   Radiological Exams on Admission: Personally reviewed: Dg Chest 2 View  03/07/2015  CLINICAL DATA:  Dehydration, shortness of breath, had diarrhea last week EXAM: CHEST  2 VIEW COMPARISON:   01/13/2010 FINDINGS: Cardiomediastinal silhouette is stable. Mild hyperinflation. No acute infiltrate or pleural effusion. No pulmonary edema. IMPRESSION: No active cardiopulmonary disease.  Mild hyperinflation. Electronically Signed   By: Lahoma Crocker M.D.   On: 03/07/2015 15:02   Ct Abdomen Pelvis W Contrast  03/07/2015  CLINICAL DATA:  Low abdominal pain and constipation EXAM: CT ABDOMEN AND PELVIS WITH CONTRAST TECHNIQUE: Multidetector CT imaging of the abdomen and pelvis was performed using the standard protocol following bolus administration of intravenous contrast. CONTRAST:  15mL OMNIPAQUE IOHEXOL 300 MG/ML  SOLN COMPARISON:  CT 01/08/2015 FINDINGS: Lower chest: Lung bases are clear. Hepatobiliary: The liver is normal. No biliary duct dilatation. The gallbladder is mildly distended 3 cm. No inflammation. Common bile duct normal. Pancreas: Pancreas is normal. No ductal dilatation. No pancreatic inflammation. Spleen: Normal spleen Adrenals/urinary tract: Adrenal glands normal. No ureterolithiasis or obstructive uropathy. Low-density lesion lower pole of the LEFT kidney is unchanged. Normal parenchymal enhancement. Stomach/Bowel: Stomach, duodenum, small bowel are normal. Appendix contains several appendicoliths (image 41, series 5) but no dilatation or inflammation. There is fluid stool through the colon. Several diverticula descending colon without acute inflammation. The distal 5 to 6 cm of the rectum demonstrates sub mucosal edema. Perirectal fat stranding and small amount fluid in the presacral space. No evidence of perforation or abscess. (findings evident on images 77-84 of series 2). Vascular/Lymphatic: Abdominal aorta is normal caliber. There is no retroperitoneal or periportal lymphadenopathy. No pelvic lymphadenopathy. Reproductive: Prostate normal Other: No significant ventral hernia Musculoskeletal: No aggressive osseous lesion. IMPRESSION: 1. A 6 cm segment of submucosal enhancement and  perirectal inflammation involving the distal rectum to the anus. Findings are suggestive proctitis. Inflammatory bowel disease would be a secondary consideration. 2. No evidence of perforation or abscess in the pelvis. Electronically Signed   By: Suzy Bouchard M.D.   On: 03/07/2015 19:53    EKG: Independently reviewed. RBBB, rate 92. No change from previous.    Assessment/Plan 1. Hypotension:  This is new.  This appears to be hypovolemic given history of diarrhea, urine ketones, creatinine bump.  Do not suspect septic shock at this time.  Fluid response, but given persistent tachycardia and persistently low BP, will admit to Stepdown.     -3L NS given -Frequent vitals -Continue IVF at 125 cc/hr   2. Proctitis:  This is new.  Infectious versus inflammatory given CT findings.  Hyperthyroidism is possible (patient had this before he thinks), and report of weight loss. -Continue empiric ciprofloxacin and metronidazole -Check TSH -Stool culture and fecal leuks -If TSH normal, would be reasonable to refer to GI although 83 seems late even for the second peak of IBD incidence      DVT PPx: Lovenox Diet: Clears for now, advance as tolerated Consultants: None Code Status: Full Family Communication: Wife, persent at bedside.  Medical decision making: What exists of the patient's previous chart was reviewed in depth and the case was discussed with Gloriann Loan, PA-C. Patient seen 9:58 PM on 03/07/2015.  Disposition Plan:  Admit  for fluid resuscitation and stool studies.  Anticipate 2-3 days admission.        Edwin Dada [facility] Pager [phone]

## 2015-03-07 NOTE — ED Notes (Addendum)
Pt states he has had diarrhea for the past week and then yesterday his diarrhea stopped and then was unable to have bowel movement so pt gave himself an enema this morning. And pt states while in waiting room he was able to have a large bowel movement. Pt now states he is dizzy and feels dehydrated.

## 2015-03-07 NOTE — ED Provider Notes (Signed)
CSN: [id number]     Arrival date & time 03/07/15  1113 History   First MD Initiated Contact with Patient 03/07/15 1405     Chief Complaint  Patient presents with  . Constipation     (Consider location/radiation/quality/duration/timing/severity/associated sxs/prior Treatment) HPI   Tommy Browning is a 81 y.o. male with PMH significant for back pain, HLD, ED, hyperthyroidism who presents with 1 week history of gradual onset, constant, improving nonbloody diarrhea that resolved 2 days ago.  He reports that the diarrhea resolved and he was then unable to have a BM for the past 2 days.  He awoke this morning with sharp abdominal pain.  He then had complete resolution of his pain after an enema which lead to a large nonbloody BM when he arrived to the ED.  He now reports feeling lightheaded and dizzy.  Associated symptoms include N/V, SOB, and chills.  Denies fever, bloody stools, current abdominal pain, cough, or CP.   Past Medical History  Diagnosis Date  . BACK PAIN 02/24/2008  . HYPOGONADISM 02/03/2010  . [place], MILD 05/30/2009  . HYPERTROPHY PROSTATE W/UR OBST & OTH LUTS 02/26/2009  . ERECTILE DYSFUNCTION, ORGANIC 05/30/2009  . ELECTROCARDIOGRAM, ABNORMAL 12/08/2006    F/U by a normal stress test on September 2005  . COLONOSCOPY, HX OF 12/28/2005  . History of thyroid nodule     Resolved by [place] 11/2006  . HYPERTHYROIDISM 12/08/2006    [place]  . Adenomatous colon polyp   . Diplopia    Past Surgical History  Procedure Laterality Date  . Colonoscopy     Family History  Problem Relation Age of Onset  . Colon cancer Sister 49  . Diabetes Neg Hx   . Thyroid disease Neg Hx     no goiter or other thyroid problem  . Stomach cancer Neg Hx   . Stroke Other     GP  . Prostate cancer Neg Hx    Social History  Substance Use Topics  . Smoking status: Former Smoker    Types: Cigarettes  . Smokeless tobacco: Never Used     Comment: 1-2 cigarettes per day  . Alcohol Use: Yes      Comment: very rare    Review of Systems All other systems negative unless otherwise stated in HPI    Allergies  Review of patient's allergies indicates no known allergies.  Home Medications   Prior to Admission medications   Medication Sig Start Date End Date Taking? Authorizing Provider  aspirin 81 MG tablet Take 81 mg by mouth daily.     Yes Historical Provider, MD  carboxymethylcellulose (REFRESH PLUS) 0.5 % SOLN Place 1 drop into both eyes 4 (four) times daily - after meals and at bedtime.   Yes Historical Provider, MD  cyanocobalamin 1000 MCG tablet Take 100 mcg by mouth daily.     Yes Historical Provider, MD  cyclobenzaprine (FLEXERIL) 10 MG tablet Take 10 mg by mouth 3 (three) times daily as needed. Muscle spasms   Yes Historical Provider, MD  dorzolamide-timolol (COSOPT) 22.3-6.8 MG/ML ophthalmic solution Place 1 drop into both eyes every 12 (twelve) hours.  10/14/11  Yes Historical Provider, MD  fish oil-omega-3 fatty acids 1000 MG capsule Take 2 g by mouth daily.     Yes Historical Provider, MD  Multiple Vitamin (MULTIVITAMIN) tablet Take 1 tablet by mouth daily.     Yes Historical Provider, MD  ondansetron (ZOFRAN) 4 MG tablet Take 1 tablet (4 mg total) by mouth every  8 (eight) hours as needed for nausea or vomiting. 02/27/15  Yes Melony Overly, MD  diazepam (VALIUM) 2 MG tablet Take 1 tablet (2 mg total) by mouth every 6 (six) hours as needed (spasms). Patient not taking: Reported on 03/07/2015 01/21/15   Hoyt Koch, MD   BP 103/73 mmHg  Pulse 92  Temp(Src) 98.3 F (36.8 C) (Oral)  Resp 19  Ht 5\' 7"  (1.702 m)  Wt 70.308 kg  BMI 24.27 kg/m2  SpO2 99% Physical Exam  Constitutional: He is oriented to person, place, and time. He appears well-developed and well-nourished.  HENT:  Head: Normocephalic and atraumatic.  Mouth/Throat: Oropharynx is clear and moist.  Eyes: Conjunctivae are normal. Pupils are equal, round, and reactive to light.  Neck: Normal range of  motion. Neck supple.  Cardiovascular: Regular rhythm and normal heart sounds.  Tachycardia present.   No murmur heard. Pulmonary/Chest: Effort normal and breath sounds normal. No accessory muscle usage or stridor. No respiratory distress. He has no wheezes. He has no rhonchi. He has no rales.  Abdominal: Soft. Bowel sounds are normal. He exhibits no distension. There is no tenderness. There is no rebound and no guarding.  Musculoskeletal: Normal range of motion.  Lymphadenopathy:    He has no cervical adenopathy.  Neurological: He is alert and oriented to person, place, and time.  Speech clear without dysarthria.  Skin: Skin is warm and dry.  Psychiatric: He has a normal mood and affect. His behavior is normal.    ED Course  Procedures (including critical care time) Labs Review Labs Reviewed  LIPASE, BLOOD - Abnormal; Notable for the following:    Lipase 114 (*)    All other components within normal limits  COMPREHENSIVE METABOLIC PANEL - Abnormal; Notable for the following:    Glucose, Bld 138 (*)    Creatinine, Ser 1.36 (*)    GFR calc non Af Amer 46 (*)    GFR calc Af Amer 54 (*)    All other components within normal limits  CBC - Abnormal; Notable for the following:    WBC 13.9 (*)    All other components within normal limits  URINALYSIS, ROUTINE W REFLEX MICROSCOPIC (NOT AT [facility]) - Abnormal; Notable for the following:    Color, Urine AMBER (*)    Bilirubin Urine SMALL (*)    Ketones, ur 15 (*)    All other components within normal limits  I-STAT CG4 LACTIC ACID, ED    Imaging Review Dg Chest 2 View  03/07/2015  CLINICAL DATA:  Dehydration, shortness of breath, had diarrhea last week EXAM: CHEST  2 VIEW COMPARISON:  01/13/2010 FINDINGS: Cardiomediastinal silhouette is stable. Mild hyperinflation. No acute infiltrate or pleural effusion. No pulmonary edema. IMPRESSION: No active cardiopulmonary disease.  Mild hyperinflation. Electronically Signed   By: Lahoma Crocker M.D.   On:  03/07/2015 15:02   Ct Abdomen Pelvis W Contrast  03/07/2015  CLINICAL DATA:  Low abdominal pain and constipation EXAM: CT ABDOMEN AND PELVIS WITH CONTRAST TECHNIQUE: Multidetector CT imaging of the abdomen and pelvis was performed using the standard protocol following bolus administration of intravenous contrast. CONTRAST:  17mL OMNIPAQUE IOHEXOL 300 MG/ML  SOLN COMPARISON:  CT 01/08/2015 FINDINGS: Lower chest: Lung bases are clear. Hepatobiliary: The liver is normal. No biliary duct dilatation. The gallbladder is mildly distended 3 cm. No inflammation. Common bile duct normal. Pancreas: Pancreas is normal. No ductal dilatation. No pancreatic inflammation. Spleen: Normal spleen Adrenals/urinary tract: Adrenal glands normal. No ureterolithiasis  or obstructive uropathy. Low-density lesion lower pole of the LEFT kidney is unchanged. Normal parenchymal enhancement. Stomach/Bowel: Stomach, duodenum, small bowel are normal. Appendix contains several appendicoliths (image 41, series 5) but no dilatation or inflammation. There is fluid stool through the colon. Several diverticula descending colon without acute inflammation. The distal 5 to 6 cm of the rectum demonstrates sub mucosal edema. Perirectal fat stranding and small amount fluid in the presacral space. No evidence of perforation or abscess. (findings evident on images 77-84 of series 2). Vascular/Lymphatic: Abdominal aorta is normal caliber. There is no retroperitoneal or periportal lymphadenopathy. No pelvic lymphadenopathy. Reproductive: Prostate normal Other: No significant ventral hernia Musculoskeletal: No aggressive osseous lesion. IMPRESSION: 1. A 6 cm segment of submucosal enhancement and perirectal inflammation involving the distal rectum to the anus. Findings are suggestive proctitis. Inflammatory bowel disease would be a secondary consideration. 2. No evidence of perforation or abscess in the pelvis. Electronically Signed   By: Suzy Bouchard M.D.    On: 03/07/2015 19:53   I have personally reviewed and evaluated these images and lab results as part of my medical decision-making.   EKG Interpretation   Date/Time:  Thursday March 07 2015 15:29:05 EST Ventricular Rate:  92 PR Interval:  136 QRS Duration: 121 QT Interval:  374 QTC Calculation: 463 R Axis:   93 Text Interpretation:  Age not entered, assumed to be  81 years old for  purpose of ECG interpretation Sinus rhythm Right bundle branch block no  significant change since Nov 2016 Confirmed by Regenia Skeeter  MD, SCOTT [phone])  on 03/07/2015 3:38:02 PM      MDM   Final diagnoses:  Hypotension, unspecified hypotension type  Proctitis    Patient presents with 1 week history of diarrhea that resolved Tuesday.  He had a BM this morning after an enema and he then felt dizzy, lightheaded, and nauseous.  VS show tachycardia with HR 120, hypotension of 74/65, normal respirations without hypoxia.  On exam, heart sounds with tachycardia, lungs CTAB, abdomen soft and benign.  Labs show lipase 114.  CMP shows Cr 1.36l, baseline.  CBC shows WBC 13.9 and hgb 13.8.  Will add on lactic acid and start fluids. Concern for vagal response, mesenteric ischemia, infectious etiology.  Repeat vitals show BP 86/63, will give 2 L bolus and obtain EKG.  Lactic 1.8.  UA shows no signs of infection.  CXR negative.  Persistent hypotension.  Will start 3L of fluids and obtain CT abdomen and pelvis.  CT abdomen and pelvis suggestive of proctitis without evidence of perforation or abscess in the pelvis.  BP 103/73 despite 3L fluids. Will start IV flagyl and cipro and admit to medicine. Case has been discussed with and seen by Dr. Regenia Skeeter who agrees with the above plan for admission.     Gloriann Loan, PA-C 03/07/15 2023  Sherwood Gambler, MD 03/10/15 [phone]

## 2015-03-08 ENCOUNTER — Encounter (HOSPITAL_COMMUNITY)
Admission: EM | Disposition: A | Discharge status: Home / Self Care | Payer: Self-pay | Source: Home / Self Care | Attending: Internal Medicine

## 2015-03-08 ENCOUNTER — Encounter (HOSPITAL_COMMUNITY): Payer: Self-pay

## 2015-03-08 DIAGNOSIS — R933 Abnormal findings on diagnostic imaging of other parts of digestive tract: Secondary | ICD-10-CM

## 2015-03-08 DIAGNOSIS — K6289 Other specified diseases of anus and rectum: Secondary | ICD-10-CM

## 2015-03-08 DIAGNOSIS — R197 Diarrhea, unspecified: Secondary | ICD-10-CM

## 2015-03-08 DIAGNOSIS — I959 Hypotension, unspecified: Principal | ICD-10-CM

## 2015-03-08 DIAGNOSIS — R1030 Lower abdominal pain, unspecified: Secondary | ICD-10-CM

## 2015-03-08 HISTORY — PX: FLEXIBLE SIGMOIDOSCOPY: SHX5431

## 2015-03-08 LAB — COMPREHENSIVE METABOLIC PANEL
ALK PHOS: 58 U/L (ref 38–126)
ALT: 16 U/L — AB (ref 17–63)
AST: 33 U/L (ref 15–41)
Albumin: 2.8 g/dL — ABNORMAL LOW (ref 3.5–5.0)
Anion gap: 5 (ref 5–15)
BUN: 10 mg/dL (ref 6–20)
CHLORIDE: 111 mmol/L (ref 101–111)
CO2: 23 mmol/L (ref 22–32)
CREATININE: 1.09 mg/dL (ref 0.61–1.24)
Calcium: 7.9 mg/dL — ABNORMAL LOW (ref 8.9–10.3)
GFR calc Af Amer: >60 mL/min (ref >60)
Glucose, Bld: 82 mg/dL (ref 65–99)
Potassium: 3.5 mmol/L (ref 3.5–5.1)
Sodium: 139 mmol/L (ref 135–145)
Total Bilirubin: 1 mg/dL (ref 0.3–1.2)
Total Protein: 4.9 g/dL — ABNORMAL LOW (ref 6.5–8.1)

## 2015-03-08 LAB — CBC
HCT: 33.8 % — ABNORMAL LOW (ref 39.0–52.0)
HCT: 35.1 % — ABNORMAL LOW (ref 39.0–52.0)
HEMOGLOBIN: 11.5 g/dL — AB (ref 13.0–17.0)
Hemoglobin: 11.8 g/dL — ABNORMAL LOW (ref 13.0–17.0)
MCH: 29.5 pg (ref 26.0–34.0)
MCH: 30.1 pg (ref 26.0–34.0)
MCHC: 33.6 g/dL (ref 30.0–36.0)
MCHC: 34 g/dL (ref 30.0–36.0)
MCV: 87.8 fL (ref 78.0–100.0)
MCV: 88.5 fL (ref 78.0–100.0)
PLATELETS: 168 10*3/uL (ref 150–400)
PLATELETS: 172 10*3/uL (ref 150–400)
RBC: 3.82 MIL/uL — AB (ref 4.22–5.81)
RBC: 4 MIL/uL — ABNORMAL LOW (ref 4.22–5.81)
RDW: 13.3 % (ref 11.5–15.5)
RDW: 13.4 % (ref 11.5–15.5)
WBC: 13.7 10*3/uL — AB (ref 4.0–10.5)
WBC: 14.1 10*3/uL — AB (ref 4.0–10.5)

## 2015-03-08 LAB — CREATININE, SERUM
Creatinine, Ser: 1.18 mg/dL (ref 0.61–1.24)
GFR calc Af Amer: >60 mL/min (ref >60)
GFR calc non Af Amer: 55 mL/min — ABNORMAL LOW (ref >60)

## 2015-03-08 LAB — MRSA PCR SCREENING: MRSA by PCR: NEGATIVE

## 2015-03-08 LAB — TSH: TSH: 2.448 u[IU]/mL (ref 0.350–4.500)

## 2015-03-08 SURGERY — SIGMOIDOSCOPY, FLEXIBLE
Anesthesia: Moderate Sedation

## 2015-03-08 MED ORDER — SODIUM CHLORIDE 0.9 % IV BOLUS (SEPSIS)
1000.0000 mL | Freq: Once | INTRAVENOUS | Status: AC
  Administered 2015-03-08: 1000 mL via INTRAVENOUS

## 2015-03-08 MED ORDER — LEVOFLOXACIN IN D5W 750 MG/150ML IV SOLN
750.0000 mg | INTRAVENOUS | Status: DC
  Administered 2015-03-08: 750 mg via INTRAVENOUS
  Filled 2015-03-08: qty 150

## 2015-03-08 MED ORDER — SODIUM CHLORIDE 0.9 % IV SOLN: INTRAVENOUS | Status: DC

## 2015-03-08 MED ORDER — FENTANYL CITRATE (PF) 100 MCG/2ML IJ SOLN
INTRAMUSCULAR | Status: AC
  Filled 2015-03-08: qty 2

## 2015-03-08 MED ORDER — DIPHENHYDRAMINE HCL 50 MG/ML IJ SOLN
INTRAMUSCULAR | Status: AC
  Filled 2015-03-08: qty 1

## 2015-03-08 MED ORDER — LEVOFLOXACIN IN D5W 750 MG/150ML IV SOLN
750.0000 mg | INTRAVENOUS | Status: DC
  Administered 2015-03-09: 750 mg via INTRAVENOUS
  Filled 2015-03-08: qty 150

## 2015-03-08 MED ORDER — DORZOLAMIDE HCL-TIMOLOL MAL 2-0.5 % OP SOLN
1.0000 [drp] | Freq: Two times a day (BID) | OPHTHALMIC | Status: DC
  Administered 2015-03-08 – 2015-03-09 (×3): 1 [drp] via OPHTHALMIC
  Filled 2015-03-08: qty 10

## 2015-03-08 MED ORDER — METRONIDAZOLE IN NACL 5-0.79 MG/ML-% IV SOLN
500.0000 mg | Freq: Three times a day (TID) | INTRAVENOUS | Status: DC
  Administered 2015-03-08 – 2015-03-09 (×5): 500 mg via INTRAVENOUS
  Filled 2015-03-08 (×7): qty 100

## 2015-03-08 MED ORDER — FLEET ENEMA 7-19 GM/118ML RE ENEM
1.0000 | ENEMA | Freq: Once | RECTAL | Status: AC
  Administered 2015-03-08: 1 via RECTAL
  Filled 2015-03-08: qty 1

## 2015-03-08 MED ORDER — MIDAZOLAM HCL 5 MG/ML IJ SOLN
INTRAMUSCULAR | Status: AC
  Filled 2015-03-08: qty 2

## 2015-03-08 MED ORDER — MESALAMINE 1000 MG RE SUPP
1000.0000 mg | Freq: Two times a day (BID) | RECTAL | Status: DC
  Administered 2015-03-08 – 2015-03-09 (×2): 1000 mg via RECTAL
  Filled 2015-03-08 (×3): qty 1

## 2015-03-08 NOTE — Op Note (Addendum)
[facility] [place], [id number]   FLEXIBLE SIGMOIDOSCOPY PROCEDURE REPORT  PATIENT: Tommy, Browning  MR#: [id number] BIRTHDATE: January 10, 1930 , 85  yrs. old GENDER: male ENDOSCOPIST: Ladene Artist, MD, [facility] REFERRED BY: [facility] PROCEDURE DATE:  03/08/2015 PROCEDURE:   Sigmoidoscopy with biopsy ASA CLASS:   Class II INDICATIONS:an abnormal CT of rectum,   unexplained diarrhea. MEDICATIONS: No sedation DESCRIPTION OF PROCEDURE:   After the risks benefits and alternatives of the procedure were thoroughly explained, informed consent was obtained.  Digital exam revealed no abnormalities of the rectum. The [id number]  endoscope was introduced through the anus  and advanced to the sigmoid colon , The exam was without limitations.    The quality of the prep was The overall prep quality was good. . Estimated blood loss is zero unless otherwise noted in this procedure report. The instrument was then slowly withdrawn as the mucosa was fully examined.    COLON FINDINGS: There was mild diverticulosis noted in the sigmoid colon.  Abnormal mucosa was found in the distal rectum.  The mucosa was friable, erythematous and had granularity.  Multiple biopsies of the area were performed.  Retroflexed views revealed internal Grade I hemorrhoids and  distal proctitis as described. The exam was otherwise normal.  The scope was then withdrawn from the patient and the procedure terminated.  COMPLICATIONS: There were no immediate complications.  ENDOSCOPIC IMPRESSION: 1.   Mild diverticulosis in the sigmoid colon 2.   Non specific proctitis in the distal rectum; multiple biopsies performed 3.   Grade I internal hemorrhoids  RECOMMENDATIONS: 1.  Await pathology results 2.  Canasa 1000 mg supp bid for 1 week then qd for 2 weeks 3.  Cipro and Flagyl for 7 day course 4.  Outpatient GI follow up in 4 weeks 5.  Advance diet, if tolerated and his  symptoms remain under control consider discharge   eSigned:  Ladene Artist, MD, [facility] 03/08/2015 5:34 PM Revised: 03/08/2015 5:34 PM

## 2015-03-08 NOTE — Progress Notes (Signed)
ANTIBIOTIC CONSULT NOTE - INITIAL  Pharmacy Consult for Levaquin/Flagyl  Indication: Intra-abdominal infection  No Known Allergies  Patient Measurements: Height: 5\' 7"  (170.2 cm) Weight: 155 lb (70.308 kg) IBW/kg (Calculated) : 66.1  Vital Signs: BP: 95/69 mmHg (12/08 2345) Pulse Rate: 81 (12/08 2345)  Labs:  Recent Labs  03/07/15 1157  WBC 13.9*  HGB 13.8  PLT 214  CREATININE 1.36*   Estimated Creatinine Clearance: 38.5 mL/min (by C-G formula based on Cr of 1.36).  Medical History: Past Medical History  Diagnosis Date  . BACK PAIN 02/24/2008  . HYPOGONADISM 02/03/2010  . [place], MILD 05/30/2009  . HYPERTROPHY PROSTATE W/UR OBST & OTH LUTS 02/26/2009  . ERECTILE DYSFUNCTION, ORGANIC 05/30/2009  . ELECTROCARDIOGRAM, ABNORMAL 12/08/2006    F/U by a normal stress test on September 2005  . COLONOSCOPY, HX OF 12/28/2005  . History of thyroid nodule     Resolved by [place] 11/2006  . HYPERTHYROIDISM 12/08/2006    [place]  . Adenomatous colon polyp   . Diplopia    Assessment: 81 y/o M with diarrhea/abd pain, WBC mildly elevated, mild renal dysfunction, other labs/meds reviewed.   Plan:  -Levaquin 750 mg IV q48h -Flagyl 500 mg IV q8h -F/U infectious work-up  Narda Bonds 03/08/2015,12:12 AM

## 2015-03-08 NOTE — ED Notes (Signed)
Dr. Loleta Books states patient is appropriate for SDU and would like patient to go up to [facility].

## 2015-03-08 NOTE — ED Notes (Signed)
RN Merleen Nicely from [facility] states she paged the admitting MD and he told her he would come to re-assess the patient shortly.

## 2015-03-08 NOTE — Interval H&P Note (Signed)
History and Physical Interval Note:  03/08/2015 4:29 PM  Tommy Browning  has presented today for surgery, with the diagnosis of Proctitis, diarrhea  The various methods of treatment have been discussed with the patient and family. After consideration of risks, benefits and other options for treatment, the patient has consented to  Procedure(s): FLEXIBLE SIGMOIDOSCOPY (N/A) as a surgical intervention .  The patient's history has been reviewed, patient examined, no change in status, stable for surgery.  I have reviewed the patient's chart and labs.  Questions were answered to the patient's satisfaction.     Pricilla Riffle. [place] Plan

## 2015-03-08 NOTE — Progress Notes (Signed)
[facility] PROGRESS NOTE  GREAT ISOBE [id number] DOB: September 22, 1929 DOA: 03/07/2015 PCP: Hoyt Koch, MD  Assessment/Plan: 1. Proctitis: - admitted to step down for hypotension. Started on IV antibiotics. GI consulted for recommendations an dhe underwent flex sigmoidoscopy.  - recommend to continue with IV fluids and IV antibiotics.  - flex sig shows mild diverticulosis , non specific proctitis and grade 1 internal hemorrhoids.  - plan to follow up the biopsy results,  - advance diet as tolerated.  - outpatient follow up with GI in 4 weeks.    Mild normocytic anemia: Stable.    Leukocytosis: Probably from proctitis.   Code Status: full code.  Family Communication: mulitple family members at bedside.  Disposition Plan: pending PT eval. Possibly home when able to tolerate regular diet.    Consultants:  Gastroenterology.   Procedures:  Flex sigmoidoscopy.   Antibiotics:  levaquin   flagyl  HPI/Subjective: No new complaints. No nausea, or vomiting.  Objective: Filed Vitals:   03/08/15 1600 03/08/15 1628  BP: 100/70 101/75  Pulse: 71 70  Temp: 98.5 F (36.9 C) 98.2 F (36.8 C)  Resp: 18 17    Intake/Output Summary (Last 24 hours) at 03/08/15 1642 Last data filed at 03/08/15 1400  Gross per 24 hour  Intake   5370 ml  Output    425 ml  Net   4945 ml   Filed Weights   03/07/15 1152 03/08/15 0230  Weight: 70.308 kg (155 lb) 70.2 kg (154 lb 12.2 oz)    Exam:   General:  Alert afebrile comfortable.   Cardiovascular: s1s2  Respiratory: ctab  Abdomen: soft non tender non distended bowel sounds heard.   Musculoskeletal: no pedal edema.   Data Reviewed: Basic Metabolic Panel:  Recent Labs Lab 03/07/15 1157 03/08/15 0016 03/08/15 0317  NA 139  --  139  K 3.7  --  3.5  CL 102  --  111  CO2 23  --  23  GLUCOSE 138*  --  82  BUN 13  --  10  CREATININE 1.36* 1.18 1.09  CALCIUM 9.1  --  7.9*   Liver Function Tests:  Recent  Labs Lab 03/07/15 1157 03/08/15 0317  AST 38 33  ALT 22 16*  ALKPHOS 84 58  BILITOT 1.0 1.0  PROT 6.7 4.9*  ALBUMIN 3.8 2.8*    Recent Labs Lab 03/07/15 1157  LIPASE 114*   No results for input(s): AMMONIA in the last 168 hours. CBC:  Recent Labs Lab 03/07/15 1157 03/08/15 0016 03/08/15 0317  WBC 13.9* 13.7* 14.1*  HGB 13.8 11.8* 11.5*  HCT 40.8 35.1* 33.8*  MCV 87.7 87.8 88.5  PLT 214 168 172   Cardiac Enzymes: No results for input(s): CKTOTAL, CKMB, CKMBINDEX, TROPONINI in the last 168 hours. BNP (last 3 results) No results for input(s): BNP in the last 8760 hours.  ProBNP (last 3 results) No results for input(s): PROBNP in the last 8760 hours.  CBG: No results for input(s): GLUCAP in the last 168 hours.  Recent Results (from the past 240 hour(s))  MRSA PCR Screening     Status: None   Collection Time: 03/08/15  3:32 AM  Result Value Ref Range Status   MRSA by PCR NEGATIVE NEGATIVE Final    Comment:        The GeneXpert MRSA Assay (FDA approved for NASAL specimens only), is one component of a comprehensive MRSA colonization surveillance program. It is not intended to diagnose MRSA infection nor  to guide or monitor treatment for MRSA infections.      Studies: Dg Chest 2 View  03/07/2015  CLINICAL DATA:  Dehydration, shortness of breath, had diarrhea last week EXAM: CHEST  2 VIEW COMPARISON:  01/13/2010 FINDINGS: Cardiomediastinal silhouette is stable. Mild hyperinflation. No acute infiltrate or pleural effusion. No pulmonary edema. IMPRESSION: No active cardiopulmonary disease.  Mild hyperinflation. Electronically Signed   By: Lahoma Crocker M.D.   On: 03/07/2015 15:02   Ct Abdomen Pelvis W Contrast  03/07/2015  CLINICAL DATA:  Low abdominal pain and constipation EXAM: CT ABDOMEN AND PELVIS WITH CONTRAST TECHNIQUE: Multidetector CT imaging of the abdomen and pelvis was performed using the standard protocol following bolus administration of intravenous  contrast. CONTRAST:  153mL OMNIPAQUE IOHEXOL 300 MG/ML  SOLN COMPARISON:  CT 01/08/2015 FINDINGS: Lower chest: Lung bases are clear. Hepatobiliary: The liver is normal. No biliary duct dilatation. The gallbladder is mildly distended 3 cm. No inflammation. Common bile duct normal. Pancreas: Pancreas is normal. No ductal dilatation. No pancreatic inflammation. Spleen: Normal spleen Adrenals/urinary tract: Adrenal glands normal. No ureterolithiasis or obstructive uropathy. Low-density lesion lower pole of the LEFT kidney is unchanged. Normal parenchymal enhancement. Stomach/Bowel: Stomach, duodenum, small bowel are normal. Appendix contains several appendicoliths (image 41, series 5) but no dilatation or inflammation. There is fluid stool through the colon. Several diverticula descending colon without acute inflammation. The distal 5 to 6 cm of the rectum demonstrates sub mucosal edema. Perirectal fat stranding and small amount fluid in the presacral space. No evidence of perforation or abscess. (findings evident on images 77-84 of series 2). Vascular/Lymphatic: Abdominal aorta is normal caliber. There is no retroperitoneal or periportal lymphadenopathy. No pelvic lymphadenopathy. Reproductive: Prostate normal Other: No significant ventral hernia Musculoskeletal: No aggressive osseous lesion. IMPRESSION: 1. A 6 cm segment of submucosal enhancement and perirectal inflammation involving the distal rectum to the anus. Findings are suggestive proctitis. Inflammatory bowel disease would be a secondary consideration. 2. No evidence of perforation or abscess in the pelvis. Electronically Signed   By: Suzy Bouchard M.D.   On: 03/07/2015 19:53    Scheduled Meds: . [MAR Hold] aspirin EC  81 mg Oral Daily  . [MAR Hold] dorzolamide-timolol  1 drop Both Eyes BID  . [MAR Hold] enoxaparin (LOVENOX) injection  40 mg Subcutaneous Daily  . [MAR Hold] levofloxacin (LEVAQUIN) IV  750 mg Intravenous Q24H  . [MAR Hold]  metronidazole  500 mg Intravenous Q8H  . sodium phosphate  1 enema Rectal Once   Continuous Infusions: . sodium chloride 125 mL/hr at 03/08/15 1357    Principal Problem:   Hypotension Active Problems:   Proctitis    Time spent: 25 minutes.     [facility] Pager [phone]. If 7PM-7AM, please contact night-coverage at [email or web address], password [facility] 03/08/2015, 4:42 PM  LOS: 1 day

## 2015-03-08 NOTE — Consult Note (Signed)
[facility] Consult: 1:26 PM 03/08/2015  LOS: 1 day    Referring Provider: Dr Karleen Hampshire.  Primary Care Physician:  Hoyt Koch, MD Primary Gastroenterologist:  Dr. Fuller Plan  Reason for Consultation:  Proctitis per CT   HPI: Tommy Browning is a 81 y.o. male.  Hx colon polyps, BPH, hypothyroidism, hypogonadism.    11/2011 Colonoscopy for hx colon cancer in sister and pt hx of TVA polyp 2004, tubular adenoma 2007.  2 sessile polyps in transverse (tubular adenomas).  Sigmoid tics.  Small int rrhoids.   01/08/15 CT scan with contrast for abd pain: unremarkable.  Labs unremarkable   Early last week had nausea and then diarrhea.  Seen in urgent care 11/30 and treated with IVF, IV Zofran.  Discharged on po Zofran. Nausea did not recurr. Diarrhea persisted through ~12/5, no BMs on 12/7 or 12/7. 12/8 woke up with severe low abdominal pain.  Gave himself an enema and had diarrheal stool at home and in hospital.  Pain resolved after BMs.  Admitted due to hypotension to 70s/60s and tachy to 120s.    WBCs 14.   12/8 CT scan: short segment of perirectal inflammation sugg of proctitis.  Cipro/Flagyl day 2.   Appetite poor generally.  No dysphagia.  No blood PR.  No NSAIDs/ASA.  No new meds.  No BPR/melena.  No hx prostate irradiation. + weight loss ~10# in last few months.      Past Medical History  Diagnosis Date  . BACK PAIN 02/24/2008  . HYPOGONADISM 02/03/2010  . [place], MILD 05/30/2009  . HYPERTROPHY PROSTATE W/UR OBST & OTH LUTS 02/26/2009  . ERECTILE DYSFUNCTION, ORGANIC 05/30/2009  . ELECTROCARDIOGRAM, ABNORMAL 12/08/2006    F/U by a normal stress test on September 2005  . COLONOSCOPY, HX OF 12/28/2005  . History of thyroid nodule     Resolved by [place] 11/2006  . HYPERTHYROIDISM 12/08/2006    [place]  .  Adenomatous colon polyp   . Diplopia     Past Surgical History  Procedure Laterality Date  . Colonoscopy      Prior to Admission medications   Medication Sig Start Date End Date Taking? Authorizing Provider  aspirin 81 MG tablet Take 81 mg by mouth daily.     Yes Historical Provider, MD  carboxymethylcellulose (REFRESH PLUS) 0.5 % SOLN Place 1 drop into both eyes 4 (four) times daily - after meals and at bedtime.   Yes Historical Provider, MD  cyanocobalamin 1000 MCG tablet Take 100 mcg by mouth daily.     Yes Historical Provider, MD  cyclobenzaprine (FLEXERIL) 10 MG tablet Take 10 mg by mouth 3 (three) times daily as needed. Muscle spasms   Yes Historical Provider, MD  dorzolamide-timolol (COSOPT) 22.3-6.8 MG/ML ophthalmic solution Place 1 drop into both eyes every 12 (twelve) hours.  10/14/11  Yes Historical Provider, MD  fish oil-omega-3 fatty acids 1000 MG capsule Take 2 g by mouth daily.     Yes Historical Provider, MD  Multiple Vitamin (MULTIVITAMIN) tablet Take 1 tablet by mouth daily.  Yes Historical Provider, MD  ondansetron (ZOFRAN) 4 MG tablet Take 1 tablet (4 mg total) by mouth every 8 (eight) hours as needed for nausea or vomiting. 02/27/15  Yes Melony Overly, MD  diazepam (VALIUM) 2 MG tablet Take 1 tablet (2 mg total) by mouth every 6 (six) hours as needed (spasms). Patient not taking: Reported on 03/07/2015 01/21/15   Hoyt Koch, MD    Scheduled Meds: . aspirin EC  81 mg Oral Daily  . dorzolamide-timolol  1 drop Both Eyes BID  . enoxaparin (LOVENOX) injection  40 mg Subcutaneous Daily  . [START ON 03/09/2015] levofloxacin (LEVAQUIN) IV  750 mg Intravenous Q24H  . metronidazole  500 mg Intravenous Q8H   Infusions: . sodium chloride 125 mL/hr at 03/08/15 0028   PRN Meds: acetaminophen **OR** acetaminophen, ondansetron **OR** ondansetron (ZOFRAN) IV   Allergies as of 03/07/2015  . (No Known Allergies)    Family History  Problem Relation Age of Onset    . Colon cancer Sister 41  . Diabetes Neg Hx   . Thyroid disease Neg Hx     no goiter or other thyroid problem  . Stomach cancer Neg Hx   . Stroke Other     GP  . Prostate cancer Neg Hx     Social History   Social History  . Marital Status: Married    Spouse Name: N/A  . Number of Children: 2  . Years of Education: N/A   Occupational History  . 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 Smoking status: Former Smoker    Types: Cigarettes  . Smokeless tobacco: Never Used     Comment: 1-2 cigarettes per day  . Alcohol Use: Yes     Comment: very rare  . Drug Use: No  . Sexual Activity: Not on file     Comment: casual smoker   Other Topics Concern  . 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  Generally sedentary, sits and watches TV alot ENT:  No nose bleeds Pulm:  No SOB or cough CV:  No palpitations, no LE edema.  GU:  No hematuria, no frequency GI:  Per HPI Heme:  No issue with unusual or excessive bleeding or bruising.    Transfusions:  None in past Neuro:  No headaches, no peripheral tingling or numbness Derm:  No itching, no rash or sores.  Endocrine:  No sweats or chills.  No polyuria or dysuria Immunization:  fluvax current.  Travel:  None beyond local counties in last few months.    PHYSICAL EXAM: Vital signs in last 24 hours: Filed Vitals:   03/08/15 0800 03/08/15 1200  BP: 98/72 95/73  Pulse: 73 79  Temp: 99.7 F (37.6 C) 98.3 F (36.8 C)  Resp: 21 26   Wt Readings from Last 3 Encounters:  03/08/15 70.2 kg (154 lb 12.2 oz)  01/21/15 70.761 kg (156 lb)  01/07/15 70.875 kg (156 lb 4 oz)    General: pleasant, somewhat ill and tired looking, not toxic Head:  No asymmetry or swelling  Eyes:  No icterus or pallor.  EOMI Ears:  Slight HOH  Nose:  No discharge  orcongestion Mouth:  Clear, moist, many absent teeth.  No lesions.  Neck:  No mass, no TMG, no JVD Lungs:  Diminished but clear and no  Dyspnea or cough Heart: RRR.  [place].  S1/S2 audible Abdomen:  Soft, NT, ND.  No mass or HSM.  No hernias.   Rectal: deferred   Musc/Skeltl: no joint swelling, contracture or deformity Extremities:  No CCE  Neurologic:  Oriented x 3.  Alert.  No limb weakness or tremor Skin:  No telangectasia, rash or sores.  Tattoos:  None seen   Psych:  Calm, pleasant, cooperative.   Intake/Output from previous day: 12/08 0701 - 12/09 0700 In: 4125 [I.V.:4125] Out: 250 [Urine:250] Intake/Output this shift: Total I/O In: 520 [P.O.:120; I.V.:250; IV Piggyback:150] Out: -   LAB RESULTS:  Recent Labs  03/07/15 1157 03/08/15 0016 03/08/15 0317  WBC 13.9* 13.7* 14.1*  HGB 13.8 11.8* 11.5*  HCT 40.8 35.1* 33.8*  PLT 214 168 172   BMET Lab Results  Component Value Date   NA 139 03/08/2015   NA 139 03/07/2015   NA 141 02/27/2015   K 3.5 03/08/2015   K 3.7 03/07/2015   K 3.7 02/27/2015   CL 111 03/08/2015   CL 102 03/07/2015   CL 103 02/27/2015   CO2 23 03/08/2015   CO2 23 03/07/2015   CO2 27 01/08/2015   GLUCOSE 82 03/08/2015   GLUCOSE 138* 03/07/2015   GLUCOSE 95 02/27/2015   BUN 10 03/08/2015   BUN 13 03/07/2015   BUN 21* 02/27/2015   CREATININE 1.09 03/08/2015   CREATININE 1.18 03/08/2015   CREATININE 1.36* 03/07/2015   CALCIUM 7.9* 03/08/2015   CALCIUM 9.1 03/07/2015   CALCIUM 9.2 01/08/2015   LFT  Recent Labs  03/07/15 1157 03/08/15 0317  PROT 6.7 4.9*  ALBUMIN 3.8 2.8*  AST 38 33  ALT 22 16*  ALKPHOS 84 58  BILITOT 1.0 1.0   PT/INR No results found for: INR, PROTIME Hepatitis Panel No results for input(s): HEPBSAG, HCVAB, HEPAIGM, HEPBIGM in the last 72 hours. C-Diff No components found for: CDIFF Lipase     Component Value Date/Time   LIPASE 114* 03/07/2015 1157    Drugs of Abuse  No results found for: LABOPIA,  COCAINSCRNUR, LABBENZ, AMPHETMU, THCU, LABBARB   RADIOLOGY STUDIES: Dg Chest 2 View  03/07/2015  CLINICAL DATA:  Dehydration, shortness of breath, had diarrhea last week EXAM: CHEST  2 VIEW COMPARISON:  01/13/2010 FINDINGS: Cardiomediastinal silhouette is stable. Mild hyperinflation. No acute infiltrate or pleural effusion. No pulmonary edema. IMPRESSION: No active cardiopulmonary disease.  Mild hyperinflation. Electronically Signed   By: Lahoma Crocker M.D.   On: 03/07/2015 15:02   Ct Abdomen Pelvis W Contrast  03/07/2015  CLINICAL DATA:  Low abdominal pain and constipation EXAM: CT ABDOMEN AND PELVIS WITH CONTRAST TECHNIQUE: Multidetector CT imaging of the abdomen and pelvis was performed using the standard protocol following bolus administration of intravenous contrast. CONTRAST:  1100mL OMNIPAQUE IOHEXOL 300 MG/ML  SOLN COMPARISON:  CT 01/08/2015 FINDINGS: Lower chest: Lung bases are clear. Hepatobiliary: The liver is normal. No biliary duct dilatation. The gallbladder is mildly distended 3 cm. No inflammation. Common bile duct normal. Pancreas: Pancreas is normal. No ductal dilatation. No pancreatic inflammation. Spleen: Normal spleen Adrenals/urinary tract: Adrenal glands normal. No ureterolithiasis or obstructive uropathy. Low-density lesion lower pole of the LEFT kidney is unchanged. Normal parenchymal enhancement. Stomach/Bowel: Stomach, duodenum, small bowel are normal. Appendix contains several appendicoliths (image 41, series 5) but no dilatation or inflammation. There is fluid stool through the  colon. Several diverticula descending colon without acute inflammation. The distal 5 to 6 cm of the rectum demonstrates sub mucosal edema. Perirectal fat stranding and small amount fluid in the presacral space. No evidence of perforation or abscess. (findings evident on images 77-84 of series 2). Vascular/Lymphatic: Abdominal aorta is normal caliber. There is no retroperitoneal or periportal lymphadenopathy. No  pelvic lymphadenopathy. Reproductive: Prostate normal Other: No significant ventral hernia Musculoskeletal: No aggressive osseous lesion. IMPRESSION: 1. A 6 cm segment of submucosal enhancement and perirectal inflammation involving the distal rectum to the anus. Findings are suggestive proctitis. Inflammatory bowel disease would be a secondary consideration. 2. No evidence of perforation or abscess in the pelvis. Electronically Signed   By: Suzy Bouchard M.D.   On: 03/07/2015 19:53    ENDOSCOPIC STUDIES: Per HPI  IMPRESSION:   *  Proctitis.  Recent diarrheal illness preceded by n/v early last week.  Acute abd pain yesterday resolved.     PLAN:     *  Flex sig  Last ate and tolerating clears at lunch today.     Azucena Freed  03/08/2015, 1:26 PM Pager: [phone]      Attending physician's note   I have taken a history, examined the patient and reviewed the chart. I agree with the Advanced Practitioner's note, impression and recommendations. N/V earlier this week resolved several days ago. Darrheal for several days and then lower abdominal pain. All GI symptoms have resolved. CT showing thickened rectal wall. R/O proctitis, etc. Flex sigmoidoscopy to further evaluate.    Lucio Edward, MD [facility] [phone] Mon-Fri 8a-5p [phone] after 5p, weekends, holidays

## 2015-03-08 NOTE — Progress Notes (Signed)
ANTIBIOTIC CONSULT NOTE -follow up  Pharmacy Consult for Levaquin/Flagyl  Indication: Intra-abdominal infection  No Known Allergies  Patient Measurements: Height: 5\' 10"  (177.8 cm) Weight: 154 lb 12.2 oz (70.2 kg) IBW/kg (Calculated) : 73  Vital Signs: Temp: 98.3 F (36.8 C) (12/09 1200) Temp Source: Oral (12/09 1200) BP: 95/73 mmHg (12/09 1200) Pulse Rate: 79 (12/09 1200)  Labs:  Recent Labs  03/07/15 1157 03/08/15 0016 03/08/15 0317  WBC 13.9* 13.7* 14.1*  HGB 13.8 11.8* 11.5*  PLT 214 168 172  CREATININE 1.36* 1.18 1.09   Estimated Creatinine Clearance: 51 mL/min (by C-G formula based on Cr of 1.09).  Assessment: 81 y/o M with diarrhea/abd pain.  Pharmacy consulted to dose levaquin and flagyl. WBC 13.9>14.1, creat 1.36>1.09, AF   cipro x 1 12/8 Flagyl 12/8>> lvq 12/9>>  MRSA PCR neg   plan:  -change Levaquin 750 mg IV q48h to 750 IV q24 -Flagyl 500 mg IV q8h -F/U infectious work-up  Eudelia Bunch, Pharm.D. BP:[id number] 03/08/2015 12:56 PM

## 2015-03-08 NOTE — ED Notes (Signed)
Dr. Danford at bedside  

## 2015-03-08 NOTE — H&P (View-Only) (Signed)
[facility] Consult: 1:26 PM 03/08/2015  LOS: 1 day    Referring Provider: Dr Karleen Hampshire.  Primary Care Physician:  Hoyt Koch, MD Primary Gastroenterologist:  Dr. Fuller Plan  Reason for Consultation:  Proctitis per CT   HPI: Tommy Browning is a 81 y.o. male.  Hx colon polyps, BPH, hypothyroidism, hypogonadism.    11/2011 Colonoscopy for hx colon cancer in sister and pt hx of TVA polyp 2004, tubular adenoma 2007.  2 sessile polyps in transverse (tubular adenomas).  Sigmoid tics.  Small int rrhoids.   01/08/15 CT scan with contrast for abd pain: unremarkable.  Labs unremarkable   Early last week had nausea and then diarrhea.  Seen in urgent care 11/30 and treated with IVF, IV Zofran.  Discharged on po Zofran. Nausea did not recurr. Diarrhea persisted through ~12/5, no BMs on 12/7 or 12/7. 12/8 woke up with severe low abdominal pain.  Gave himself an enema and had diarrheal stool at home and in hospital.  Pain resolved after BMs.  Admitted due to hypotension to 70s/60s and tachy to 120s.    WBCs 14.   12/8 CT scan: short segment of perirectal inflammation sugg of proctitis.  Cipro/Flagyl day 2.   Appetite poor generally.  No dysphagia.  No blood PR.  No NSAIDs/ASA.  No new meds.  No BPR/melena.  No hx prostate irradiation. + weight loss ~10# in last few months.      Past Medical History  Diagnosis Date  . BACK PAIN 02/24/2008  . HYPOGONADISM 02/03/2010  . [place], MILD 05/30/2009  . HYPERTROPHY PROSTATE W/UR OBST & OTH LUTS 02/26/2009  . ERECTILE DYSFUNCTION, ORGANIC 05/30/2009  . ELECTROCARDIOGRAM, ABNORMAL 12/08/2006    F/U by a normal stress test on September 2005  . COLONOSCOPY, HX OF 12/28/2005  . History of thyroid nodule     Resolved by [place] 11/2006  . HYPERTHYROIDISM 12/08/2006    [place]  .  Adenomatous colon polyp   . Diplopia     Past Surgical History  Procedure Laterality Date  . Colonoscopy      Prior to Admission medications   Medication Sig Start Date End Date Taking? Authorizing Provider  aspirin 81 MG tablet Take 81 mg by mouth daily.     Yes Historical Provider, MD  carboxymethylcellulose (REFRESH PLUS) 0.5 % SOLN Place 1 drop into both eyes 4 (four) times daily - after meals and at bedtime.   Yes Historical Provider, MD  cyanocobalamin 1000 MCG tablet Take 100 mcg by mouth daily.     Yes Historical Provider, MD  cyclobenzaprine (FLEXERIL) 10 MG tablet Take 10 mg by mouth 3 (three) times daily as needed. Muscle spasms   Yes Historical Provider, MD  dorzolamide-timolol (COSOPT) 22.3-6.8 MG/ML ophthalmic solution Place 1 drop into both eyes every 12 (twelve) hours.  10/14/11  Yes Historical Provider, MD  fish oil-omega-3 fatty acids 1000 MG capsule Take 2 g by mouth daily.     Yes Historical Provider, MD  Multiple Vitamin (MULTIVITAMIN) tablet Take 1 tablet by mouth daily.  Yes Historical Provider, MD  ondansetron (ZOFRAN) 4 MG tablet Take 1 tablet (4 mg total) by mouth every 8 (eight) hours as needed for nausea or vomiting. 02/27/15  Yes Melony Overly, MD  diazepam (VALIUM) 2 MG tablet Take 1 tablet (2 mg total) by mouth every 6 (six) hours as needed (spasms). Patient not taking: Reported on 03/07/2015 01/21/15   Hoyt Koch, MD    Scheduled Meds: . aspirin EC  81 mg Oral Daily  . dorzolamide-timolol  1 drop Both Eyes BID  . enoxaparin (LOVENOX) injection  40 mg Subcutaneous Daily  . [START ON 03/09/2015] levofloxacin (LEVAQUIN) IV  750 mg Intravenous Q24H  . metronidazole  500 mg Intravenous Q8H   Infusions: . sodium chloride 125 mL/hr at 03/08/15 0028   PRN Meds: acetaminophen **OR** acetaminophen, ondansetron **OR** ondansetron (ZOFRAN) IV   Allergies as of 03/07/2015  . (No Known Allergies)    Family History  Problem Relation Age of Onset    . Colon cancer Sister 66  . Diabetes Neg Hx   . Thyroid disease Neg Hx     no goiter or other thyroid problem  . Stomach cancer Neg Hx   . Stroke Other     GP  . Prostate cancer Neg Hx     Social History   Social History  . Marital Status: Married    Spouse Name: N/A  . Number of Children: 2  . Years of Education: N/A   Occupational History  . 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 Smoking status: Former Smoker    Types: Cigarettes  . Smokeless tobacco: Never Used     Comment: 1-2 cigarettes per day  . Alcohol Use: Yes     Comment: very rare  . Drug Use: No  . Sexual Activity: Not on file     Comment: casual smoker   Other Topics Concern  . 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  Generally sedentary, sits and watches TV alot ENT:  No nose bleeds Pulm:  No SOB or cough CV:  No palpitations, no LE edema.  GU:  No hematuria, no frequency GI:  Per HPI Heme:  No issue with unusual or excessive bleeding or bruising.    Transfusions:  None in past Neuro:  No headaches, no peripheral tingling or numbness Derm:  No itching, no rash or sores.  Endocrine:  No sweats or chills.  No polyuria or dysuria Immunization:  fluvax current.  Travel:  None beyond local counties in last few months.    PHYSICAL EXAM: Vital signs in last 24 hours: Filed Vitals:   03/08/15 0800 03/08/15 1200  BP: 98/72 95/73  Pulse: 73 79  Temp: 99.7 F (37.6 C) 98.3 F (36.8 C)  Resp: 21 26   Wt Readings from Last 3 Encounters:  03/08/15 70.2 kg (154 lb 12.2 oz)  01/21/15 70.761 kg (156 lb)  01/07/15 70.875 kg (156 lb 4 oz)    General: pleasant, somewhat ill and tired looking, not toxic Head:  No asymmetry or swelling  Eyes:  No icterus or pallor.  EOMI Ears:  Slight HOH  Nose:  No discharge  orcongestion Mouth:  Clear, moist, many absent teeth.  No lesions.  Neck:  No mass, no TMG, no JVD Lungs:  Diminished but clear and no  Dyspnea or cough Heart: RRR.  [place].  S1/S2 audible Abdomen:  Soft, NT, ND.  No mass or HSM.  No hernias.   Rectal: deferred   Musc/Skeltl: no joint swelling, contracture or deformity Extremities:  No CCE  Neurologic:  Oriented x 3.  Alert.  No limb weakness or tremor Skin:  No telangectasia, rash or sores.  Tattoos:  None seen   Psych:  Calm, pleasant, cooperative.   Intake/Output from previous day: 12/08 0701 - 12/09 0700 In: 4125 [I.V.:4125] Out: 250 [Urine:250] Intake/Output this shift: Total I/O In: 520 [P.O.:120; I.V.:250; IV Piggyback:150] Out: -   LAB RESULTS:  Recent Labs  03/07/15 1157 03/08/15 0016 03/08/15 0317  WBC 13.9* 13.7* 14.1*  HGB 13.8 11.8* 11.5*  HCT 40.8 35.1* 33.8*  PLT 214 168 172   BMET Lab Results  Component Value Date   NA 139 03/08/2015   NA 139 03/07/2015   NA 141 02/27/2015   K 3.5 03/08/2015   K 3.7 03/07/2015   K 3.7 02/27/2015   CL 111 03/08/2015   CL 102 03/07/2015   CL 103 02/27/2015   CO2 23 03/08/2015   CO2 23 03/07/2015   CO2 27 01/08/2015   GLUCOSE 82 03/08/2015   GLUCOSE 138* 03/07/2015   GLUCOSE 95 02/27/2015   BUN 10 03/08/2015   BUN 13 03/07/2015   BUN 21* 02/27/2015   CREATININE 1.09 03/08/2015   CREATININE 1.18 03/08/2015   CREATININE 1.36* 03/07/2015   CALCIUM 7.9* 03/08/2015   CALCIUM 9.1 03/07/2015   CALCIUM 9.2 01/08/2015   LFT  Recent Labs  03/07/15 1157 03/08/15 0317  PROT 6.7 4.9*  ALBUMIN 3.8 2.8*  AST 38 33  ALT 22 16*  ALKPHOS 84 58  BILITOT 1.0 1.0   PT/INR No results found for: INR, PROTIME Hepatitis Panel No results for input(s): HEPBSAG, HCVAB, HEPAIGM, HEPBIGM in the last 72 hours. C-Diff No components found for: CDIFF Lipase     Component Value Date/Time   LIPASE 114* 03/07/2015 1157    Drugs of Abuse  No results found for: LABOPIA,  COCAINSCRNUR, LABBENZ, AMPHETMU, THCU, LABBARB   RADIOLOGY STUDIES: Dg Chest 2 View  03/07/2015  CLINICAL DATA:  Dehydration, shortness of breath, had diarrhea last week EXAM: CHEST  2 VIEW COMPARISON:  01/13/2010 FINDINGS: Cardiomediastinal silhouette is stable. Mild hyperinflation. No acute infiltrate or pleural effusion. No pulmonary edema. IMPRESSION: No active cardiopulmonary disease.  Mild hyperinflation. Electronically Signed   By: Lahoma Crocker M.D.   On: 03/07/2015 15:02   Ct Abdomen Pelvis W Contrast  03/07/2015  CLINICAL DATA:  Low abdominal pain and constipation EXAM: CT ABDOMEN AND PELVIS WITH CONTRAST TECHNIQUE: Multidetector CT imaging of the abdomen and pelvis was performed using the standard protocol following bolus administration of intravenous contrast. CONTRAST:  127mL OMNIPAQUE IOHEXOL 300 MG/ML  SOLN COMPARISON:  CT 01/08/2015 FINDINGS: Lower chest: Lung bases are clear. Hepatobiliary: The liver is normal. No biliary duct dilatation. The gallbladder is mildly distended 3 cm. No inflammation. Common bile duct normal. Pancreas: Pancreas is normal. No ductal dilatation. No pancreatic inflammation. Spleen: Normal spleen Adrenals/urinary tract: Adrenal glands normal. No ureterolithiasis or obstructive uropathy. Low-density lesion lower pole of the LEFT kidney is unchanged. Normal parenchymal enhancement. Stomach/Bowel: Stomach, duodenum, small bowel are normal. Appendix contains several appendicoliths (image 41, series 5) but no dilatation or inflammation. There is fluid stool through the  colon. Several diverticula descending colon without acute inflammation. The distal 5 to 6 cm of the rectum demonstrates sub mucosal edema. Perirectal fat stranding and small amount fluid in the presacral space. No evidence of perforation or abscess. (findings evident on images 77-84 of series 2). Vascular/Lymphatic: Abdominal aorta is normal caliber. There is no retroperitoneal or periportal lymphadenopathy. No  pelvic lymphadenopathy. Reproductive: Prostate normal Other: No significant ventral hernia Musculoskeletal: No aggressive osseous lesion. IMPRESSION: 1. A 6 cm segment of submucosal enhancement and perirectal inflammation involving the distal rectum to the anus. Findings are suggestive proctitis. Inflammatory bowel disease would be a secondary consideration. 2. No evidence of perforation or abscess in the pelvis. Electronically Signed   By: Suzy Bouchard M.D.   On: 03/07/2015 19:53    ENDOSCOPIC STUDIES: Per HPI  IMPRESSION:   *  Proctitis.  Recent diarrheal illness preceded by n/v early last week.  Acute abd pain yesterday resolved.     PLAN:     *  Flex sig  Last ate and tolerating clears at lunch today.     Azucena Freed  03/08/2015, 1:26 PM Pager: [phone]      Attending physician's note   I have taken a history, examined the patient and reviewed the chart. I agree with the Advanced Practitioner's note, impression and recommendations. N/V earlier this week resolved several days ago. Darrheal for several days and then lower abdominal pain. All GI symptoms have resolved. CT showing thickened rectal wall. R/O proctitis, etc. Flex sigmoidoscopy to further evaluate.    Lucio Edward, MD [facility] [phone] Mon-Fri 8a-5p [phone] after 5p, weekends, holidays

## 2015-03-08 NOTE — Progress Notes (Signed)
Utilization review completed.  

## 2015-03-08 NOTE — ED Notes (Signed)
Attempted to give report. RN on [facility] states she does not know if pt would be appropriate for stepdown due to MAP WDL. She stated she would call admitting MD.

## 2015-03-09 LAB — BASIC METABOLIC PANEL
ANION GAP: 6 (ref 5–15)
BUN: 6 mg/dL (ref 6–20)
CHLORIDE: 112 mmol/L — AB (ref 101–111)
CO2: 22 mmol/L (ref 22–32)
Calcium: 8.1 mg/dL — ABNORMAL LOW (ref 8.9–10.3)
Creatinine, Ser: 0.95 mg/dL (ref 0.61–1.24)
GFR calc non Af Amer: >60 mL/min (ref >60)
Glucose, Bld: 83 mg/dL (ref 65–99)
POTASSIUM: 3.2 mmol/L — AB (ref 3.5–5.1)
Sodium: 140 mmol/L (ref 135–145)

## 2015-03-09 LAB — CBC
HEMATOCRIT: 32.4 % — AB (ref 39.0–52.0)
HEMOGLOBIN: 10.7 g/dL — AB (ref 13.0–17.0)
MCH: 29.3 pg (ref 26.0–34.0)
MCHC: 33 g/dL (ref 30.0–36.0)
MCV: 88.8 fL (ref 78.0–100.0)
Platelets: 157 10*3/uL (ref 150–400)
RBC: 3.65 MIL/uL — AB (ref 4.22–5.81)
RDW: 13.7 % (ref 11.5–15.5)
WBC: 8.7 10*3/uL (ref 4.0–10.5)

## 2015-03-09 LAB — LIPASE, BLOOD: LIPASE: 23 U/L (ref 11–51)

## 2015-03-09 MED ORDER — POTASSIUM CHLORIDE CRYS ER 20 MEQ PO TBCR: 40.0000 meq | EXTENDED_RELEASE_TABLET | Freq: Two times a day (BID) | ORAL | Status: DC

## 2015-03-09 MED ORDER — CIPROFLOXACIN HCL 500 MG PO TABS: 500.0000 mg | ORAL_TABLET | Freq: Two times a day (BID) | ORAL | Status: DC

## 2015-03-09 MED ORDER — MESALAMINE 1000 MG RE SUPP: 1000.0000 mg | Freq: Two times a day (BID) | RECTAL | Status: DC

## 2015-03-09 MED ORDER — METRONIDAZOLE 500 MG PO TABS: 500.0000 mg | ORAL_TABLET | Freq: Three times a day (TID) | ORAL | Status: DC

## 2015-03-09 MED ORDER — MESALAMINE 1000 MG RE SUPP: 1000.0000 mg | Freq: Every day | RECTAL | Status: DC

## 2015-03-09 MED ORDER — LEVOFLOXACIN 750 MG PO TABS: 750.0000 mg | ORAL_TABLET | Freq: Every day | ORAL | Status: DC

## 2015-03-09 MED ORDER — POTASSIUM CHLORIDE CRYS ER 20 MEQ PO TBCR
40.0000 meq | EXTENDED_RELEASE_TABLET | Freq: Two times a day (BID) | ORAL | Status: DC
  Administered 2015-03-09: 40 meq via ORAL
  Filled 2015-03-09: qty 2

## 2015-03-09 NOTE — Progress Notes (Signed)
Discharge instructions given to patient and family,VSS.

## 2015-03-11 ENCOUNTER — Encounter (HOSPITAL_COMMUNITY): Payer: Self-pay | Admitting: Gastroenterology

## 2015-03-11 ENCOUNTER — Encounter: Payer: Self-pay | Admitting: Gastroenterology

## 2015-03-11 NOTE — Discharge Summary (Addendum)
Physician Discharge Summary  Tommy Browning [id number] DOB: 1929-11-11 DOA: 03/07/2015  PCP: Hoyt Koch, MD  Admit date: 03/07/2015 Discharge date: 03/09/2015  Time spent: 25 minutes  Recommendations for Outpatient Follow-up:  1. Follow up with GI as recommended in 4 weeks.  2. Follow the biopsy results.    Discharge Diagnoses:  Principal Problem:   Hypotension Active Problems:   Proctitis   Discharge Condition: improved.  Diet recommendation: regular diet  Filed Weights   03/07/15 1152 03/08/15 0230  Weight: 70.308 kg (155 lb) 70.2 kg (154 lb 12.2 oz)    History of present illness:   Tommy Browning is a 81 y.o. male with a past medical history significant for hyperthyroidism currently off meds and hypogonadism previously on testosterone, and tubulovillous adenoma years ago who presents with diarrhea and abdominal pain.\  Hospital Course:  1. Proctitis: - admitted to step down for hypotension. Started on IV antibiotics. GI consulted for recommendations an dhe underwent flex sigmoidoscopy.  - flex sig shows mild diverticulosis , non specific proctitis and grade 1 internal hemorrhoids.  follow up the biopsy results,  - advance diet as tolerated.  - outpatient follow up with GI in 4 weeks.    Mild normocytic anemia: Stable.    Leukocytosis: Probably from proctitis.  improved  Procedures:  Flex sigmoidoscopy.   Consultations: gi Discharge Exam: Filed Vitals:   03/09/15 1300 03/09/15 1400  BP: 132/93 127/88  Pulse: 92 81  Temp:    Resp: 22 24    General: alert afebrile comfortable Cardiovascular: s1s2 Respiratory: ctab  Discharge Instructions   Discharge Instructions    Discharge instructions    Complete by:  As directed   Please follow up with biopsy results with [facility] Please follow upw ith PCP in one week, get BMP checked to check renal function.          Discharge Medication List as of 03/09/2015  2:42 PM     START taking these medications   Details  ciprofloxacin (CIPRO) 500 MG tablet Take 1 tablet (500 mg total) by mouth 2 (two) times daily., Starting 03/09/2015, Until Discontinued, Print    !! mesalamine (CANASA) 1000 MG suppository Place 1 suppository (1,000 mg total) rectally 2 (two) times daily., Starting 03/09/2015, Until Sat 03/16/15, Print    !! mesalamine (CANASA) 1000 MG suppository Place 1 suppository (1,000 mg total) rectally at bedtime., Starting 03/17/2015, Until Sun 03/31/15, Print    metroNIDAZOLE (FLAGYL) 500 MG tablet Take 1 tablet (500 mg total) by mouth 3 (three) times daily., Starting 03/09/2015, Until Discontinued, Print    potassium chloride SA (K-DUR,KLOR-CON) 20 MEQ tablet Take 2 tablets (40 mEq total) by mouth 2 (two) times daily., Starting 03/09/2015, Until Discontinued, Print     !! - Potential duplicate medications found. Please discuss with provider.    CONTINUE these medications which have NOT CHANGED   Details  aspirin 81 MG tablet Take 81 mg by mouth daily.  , Until Discontinued, Historical Med    carboxymethylcellulose (REFRESH PLUS) 0.5 % SOLN Place 1 drop into both eyes 4 (four) times daily - after meals and at bedtime., Until Discontinued, Historical Med    cyanocobalamin 1000 MCG tablet Take 100 mcg by mouth daily.  , Until Discontinued, Historical Med    cyclobenzaprine (FLEXERIL) 10 MG tablet Take 10 mg by mouth 3 (three) times daily as needed. Muscle spasms, Until Discontinued, Historical Med    dorzolamide-timolol (COSOPT) 22.3-6.8 MG/ML ophthalmic solution Place 1 drop into  both eyes every 12 (twelve) hours. , Starting 10/14/2011, Until Discontinued, Historical Med    fish oil-omega-3 fatty acids 1000 MG capsule Take 2 g by mouth daily.  , Until Discontinued, Historical Med    Multiple Vitamin (MULTIVITAMIN) tablet Take 1 tablet by mouth daily.  , Until Discontinued, Historical Med    ondansetron (ZOFRAN) 4 MG tablet Take 1 tablet (4 mg total) by  mouth every 8 (eight) hours as needed for nausea or vomiting., Starting 02/27/2015, Until Discontinued, Print      STOP taking these medications     diazepam (VALIUM) 2 MG tablet        No Known Allergies Follow-up Information    Follow up with Hoyt Koch, MD. Schedule an appointment as soon as possible for a visit in 1 week.   Specialty:  Internal Medicine   Contact information:   [place] [address] [phone]       Follow up with Norberto Sorenson T. Fuller Plan, MD. Schedule an appointment as soon as possible for a visit in 1 month.   Specialty:  Gastroenterology   Why:  follow up biopsy results   Contact information:   [address] [place] [address] [phone]        The results of significant diagnostics from this hospitalization (including imaging, microbiology, ancillary and laboratory) are listed below for reference.    Significant Diagnostic Studies: Dg Chest 2 View  03/07/2015  CLINICAL DATA:  Dehydration, shortness of breath, had diarrhea last week EXAM: CHEST  2 VIEW COMPARISON:  01/13/2010 FINDINGS: Cardiomediastinal silhouette is stable. Mild hyperinflation. No acute infiltrate or pleural effusion. No pulmonary edema. IMPRESSION: No active cardiopulmonary disease.  Mild hyperinflation. Electronically Signed   By: Lahoma Crocker M.D.   On: 03/07/2015 15:02   Ct Abdomen Pelvis W Contrast  03/07/2015  CLINICAL DATA:  Low abdominal pain and constipation EXAM: CT ABDOMEN AND PELVIS WITH CONTRAST TECHNIQUE: Multidetector CT imaging of the abdomen and pelvis was performed using the standard protocol following bolus administration of intravenous contrast. CONTRAST:  182mL OMNIPAQUE IOHEXOL 300 MG/ML  SOLN COMPARISON:  CT 01/08/2015 FINDINGS: Lower chest: Lung bases are clear. Hepatobiliary: The liver is normal. No biliary duct dilatation. The gallbladder is mildly distended 3 cm. No inflammation. Common bile duct normal. Pancreas: Pancreas is normal.  No ductal dilatation. No pancreatic inflammation. Spleen: Normal spleen Adrenals/urinary tract: Adrenal glands normal. No ureterolithiasis or obstructive uropathy. Low-density lesion lower pole of the LEFT kidney is unchanged. Normal parenchymal enhancement. Stomach/Bowel: Stomach, duodenum, small bowel are normal. Appendix contains several appendicoliths (image 41, series 5) but no dilatation or inflammation. There is fluid stool through the colon. Several diverticula descending colon without acute inflammation. The distal 5 to 6 cm of the rectum demonstrates sub mucosal edema. Perirectal fat stranding and small amount fluid in the presacral space. No evidence of perforation or abscess. (findings evident on images 77-84 of series 2). Vascular/Lymphatic: Abdominal aorta is normal caliber. There is no retroperitoneal or periportal lymphadenopathy. No pelvic lymphadenopathy. Reproductive: Prostate normal Other: No significant ventral hernia Musculoskeletal: No aggressive osseous lesion. IMPRESSION: 1. A 6 cm segment of submucosal enhancement and perirectal inflammation involving the distal rectum to the anus. Findings are suggestive proctitis. Inflammatory bowel disease would be a secondary consideration. 2. No evidence of perforation or abscess in the pelvis. Electronically Signed   By: Suzy Bouchard M.D.   On: 03/07/2015 19:53    Microbiology: Recent Results (from the past 240 hour(s))  MRSA PCR Screening     Status: None   Collection Time: 03/08/15  3:32 AM  Result Value Ref Range Status   MRSA by PCR NEGATIVE NEGATIVE Final    Comment:        The GeneXpert MRSA Assay (FDA approved for NASAL specimens only), is one component of a comprehensive MRSA colonization surveillance program. It is not intended to diagnose MRSA infection nor to guide or monitor treatment for MRSA infections.      Labs: Basic Metabolic Panel:  Recent Labs Lab 03/07/15 1157 03/08/15 0016 03/08/15 0317  03/09/15 0311  NA 139  --  139 140  K 3.7  --  3.5 3.2*  CL 102  --  111 112*  CO2 23  --  23 22  GLUCOSE 138*  --  82 83  BUN 13  --  10 6  CREATININE 1.36* 1.18 1.09 0.95  CALCIUM 9.1  --  7.9* 8.1*   Liver Function Tests:  Recent Labs Lab 03/07/15 1157 03/08/15 0317  AST 38 33  ALT 22 16*  ALKPHOS 84 58  BILITOT 1.0 1.0  PROT 6.7 4.9*  ALBUMIN 3.8 2.8*    Recent Labs Lab 03/07/15 1157 03/09/15 0311  LIPASE 114* 23   No results for input(s): AMMONIA in the last 168 hours. CBC:  Recent Labs Lab 03/07/15 1157 03/08/15 0016 03/08/15 0317 03/09/15 0311  WBC 13.9* 13.7* 14.1* 8.7  HGB 13.8 11.8* 11.5* 10.7*  HCT 40.8 35.1* 33.8* 32.4*  MCV 87.7 87.8 88.5 88.8  PLT 214 168 172 157   Cardiac Enzymes: No results for input(s): CKTOTAL, CKMB, CKMBINDEX, TROPONINI in the last 168 hours. BNP: BNP (last 3 results) No results for input(s): BNP in the last 8760 hours.  ProBNP (last 3 results) No results for input(s): PROBNP in the last 8760 hours.  CBG: No results for input(s): GLUCAP in the last 168 hours.     SignedHosie Poisson  [facility] 03/11/2015, 10:10 AM

## 2015-03-12 ENCOUNTER — Telehealth: Payer: Self-pay | Admitting: *Deleted

## 2015-03-12 ENCOUNTER — Telehealth: Payer: Self-pay | Admitting: Gastroenterology

## 2015-03-12 NOTE — Telephone Encounter (Signed)
Transition Care Management Follow-up Telephone Call   Date discharged? 02/1015   How have you been since you were released from the hospital? Called pt spoke with wife stated husband doing ok   Do you understand why you were in the hospital? YES   Do you understand the discharge instructions? YES   Where were you discharged to? Home   Items Reviewed:  Medications reviewed: YES  Allergies reviewed: YES  Dietary changes reviewed: NO  Referrals reviewed: YES, have schedule to see GI doctor in about 4 weeks   Functional Questionnaire:   Activities of Daily Living (ADLs):   she states they are independent in the following: ambulation, bathing and hygiene, feeding, continence, grooming, toileting and dressing States he require assistance with the following: ambulation  Any transportation issues/concerns?: NO  Any patient concerns? NO   Confirmed importance and date/time of follow-up visits scheduled YES, pt had called and made appt for 04/15/15 stated can come after new year  Provider Appointment booked with Dr. Sharlet Salina  Confirmed with patient if condition begins to worsen call PCP or go to the ER.  Patient was given the office number and encouraged to call back with question or concerns.  : YES

## 2015-03-12 NOTE — Telephone Encounter (Signed)
Patient is scheduled for follow up for 04/17/15 9:15.  Patient notified of the appt date and time

## 2015-03-20 ENCOUNTER — Encounter: Payer: Self-pay | Admitting: Internal Medicine

## 2015-03-20 ENCOUNTER — Ambulatory Visit (INDEPENDENT_AMBULATORY_CARE_PROVIDER_SITE_OTHER): Payer: Medicare Other | Admitting: Internal Medicine

## 2015-03-20 ENCOUNTER — Other Ambulatory Visit (INDEPENDENT_AMBULATORY_CARE_PROVIDER_SITE_OTHER): Payer: Medicare Other

## 2015-03-20 VITALS — BP 110/88 | HR 89 | Wt 149.0 lb

## 2015-03-20 DIAGNOSIS — E876 Hypokalemia: Secondary | ICD-10-CM

## 2015-03-20 DIAGNOSIS — K6289 Other specified diseases of anus and rectum: Secondary | ICD-10-CM | POA: Diagnosis not present

## 2015-03-20 DIAGNOSIS — I959 Hypotension, unspecified: Secondary | ICD-10-CM | POA: Diagnosis not present

## 2015-03-20 LAB — CBC WITH DIFFERENTIAL/PLATELET
BASOS PCT: 0.4 % (ref 0.0–3.0)
Basophils Absolute: 0 10*3/uL (ref 0.0–0.1)
EOS PCT: 4.3 % (ref 0.0–5.0)
Eosinophils Absolute: 0.1 10*3/uL (ref 0.0–0.7)
HCT: 44.2 % (ref 39.0–52.0)
Hemoglobin: 14.6 g/dL (ref 13.0–17.0)
LYMPHS ABS: 1.1 10*3/uL (ref 0.7–4.0)
Lymphocytes Relative: 40.4 % (ref 12.0–46.0)
MCHC: 33.1 g/dL (ref 30.0–36.0)
MCV: 90.6 fl (ref 78.0–100.0)
MONOS PCT: 15.8 % — AB (ref 3.0–12.0)
Monocytes Absolute: 0.4 10*3/uL (ref 0.1–1.0)
NEUTROS PCT: 39.1 % — AB (ref 43.0–77.0)
Neutro Abs: 1.1 10*3/uL — ABNORMAL LOW (ref 1.4–7.7)
Platelets: 399 10*3/uL (ref 150.0–400.0)
RBC: 4.87 Mil/uL (ref 4.22–5.81)
RDW: 14.5 % (ref 11.5–15.5)
WBC: 2.8 10*3/uL — ABNORMAL LOW (ref 4.0–10.5)

## 2015-03-20 LAB — BASIC METABOLIC PANEL
BUN: 10 mg/dL (ref 6–23)
CHLORIDE: 102 meq/L (ref 96–112)
CO2: 28 meq/L (ref 19–32)
Calcium: 9.5 mg/dL (ref 8.4–10.5)
Creatinine, Ser: 1.13 mg/dL (ref 0.40–1.50)
GFR: 79.6 mL/min (ref >60.00)
GLUCOSE: 89 mg/dL (ref 70–99)
POTASSIUM: 4.3 meq/L (ref 3.5–5.1)
SODIUM: 137 meq/L (ref 135–145)

## 2015-03-20 NOTE — Assessment & Plan Note (Addendum)
Due to n/v/d 12/16 Pt is not taking KCl - finished Rx Labs Re-start K dur if low K

## 2015-03-20 NOTE — Progress Notes (Signed)
Subjective:  Patient ID: Tommy Browning., male    DOB: [birth year]  Age: 81 y.o. MRN: [id number]  CC: No chief complaint on file.   HPI [name]. presents for a post-hosp f/u:  Admit date: 03/07/2015 Discharge date: 03/09/2015  Time spent: 25 minutes  Recommendations for Outpatient Follow-up:  1. Follow up with GI as recommended in 4 weeks.  2. Follow the biopsy results.   Discharge Diagnoses:  Principal Problem:  Hypotension Active Problems:  Proctitis   Discharge Condition: improved.  Diet recommendation: regular diet  Filed Weights   03/07/15 1152 03/08/15 0230  Weight: 70.308 kg (155 lb) 70.2 kg (154 lb 12.2 oz)    History of present illness:   Tommy Browning is a 81 y.o. male with a past medical history significant for hyperthyroidism currently off meds and hypogonadism previously on testosterone, and tubulovillous adenoma years ago who presents with diarrhea and abdominal pain.\  Hospital Course:  1. Proctitis: - admitted to step down for hypotension. Started on IV antibiotics. GI consulted for recommendations an dhe underwent flex sigmoidoscopy.  - flex sig shows mild diverticulosis , non specific proctitis and grade 1 internal hemorrhoids.  follow up the biopsy results,  - advance diet as tolerated.  - outpatient follow up with GI in 4 weeks.    Mild normocytic anemia: Stable.    Leukocytosis: Probably from proctitis.  improved  Procedures:  Flex sigmoidoscopy.  Consultations: gi Discharge Exam: Filed Vitals:   03/09/15 1300 03/09/15 1400  BP: 132/93 127/88  Pulse: 92 81  Temp:    Resp: 22 24    General: alert afebrile comfortable Cardiovascular: s1s2 Respiratory: ctab  Discharge Instructions   Discharge Instructions    Discharge instructions  Complete by: As directed   Please follow up with biopsy results with [facility] Please follow upw ith PCP in one week, get BMP  checked to check renal function.          Discharge Medication List as of 03/09/2015 2:42 PM    START taking these medications   Details  ciprofloxacin (CIPRO) 500 MG tablet Take 1 tablet (500 mg total) by mouth 2 (two) times daily., Starting 03/09/2015, Until Discontinued, Print    !! mesalamine (CANASA) 1000 MG suppository Place 1 suppository (1,000 mg total) rectally 2 (two) times daily., Starting 03/09/2015, Until Sat 03/16/15, Print    !! mesalamine (CANASA) 1000 MG suppository Place 1 suppository (1,000 mg total) rectally at bedtime., Starting 03/17/2015, Until Sun 03/31/15, Print    metroNIDAZOLE (FLAGYL) 500 MG tablet Take 1 tablet (500 mg total) by mouth 3 (three) times daily., Starting 03/09/2015, Until Discontinued, Print    potassium chloride SA (K-DUR,KLOR-CON) 20 MEQ tablet Take 2 tablets (40 mEq total) by mouth 2 (two) times daily., Starting 03/09/2015, Until Discontinued, Print    !! - Potential duplicate medications found. Please discuss with provider.    CONTINUE these medications which have NOT CHANGED   Details  aspirin 81 MG tablet Take 81 mg by mouth daily. , Until Discontinued, Historical Med    carboxymethylcellulose (REFRESH PLUS) 0.5 % SOLN Place 1 drop into both eyes 4 (four) times daily - after meals and at bedtime., Until Discontinued, Historical Med    cyanocobalamin 1000 MCG tablet Take 100 mcg by mouth daily. , Until Discontinued, Historical Med    cyclobenzaprine (FLEXERIL) 10 MG tablet Take 10 mg by mouth 3 (three) times daily as needed. Muscle spasms, Until Discontinued, Historical Med  dorzolamide-timolol (COSOPT) 22.3-6.8 MG/ML ophthalmic solution Place 1 drop into both eyes every 12 (twelve) hours. , Starting 10/14/2011, Until Discontinued, Historical Med    fish oil-omega-3 fatty acids 1000 MG capsule Take 2 g by mouth daily. , Until Discontinued, Historical Med    Multiple Vitamin  (MULTIVITAMIN) tablet Take 1 tablet by mouth daily. , Until Discontinued, Historical Med    ondansetron (ZOFRAN) 4 MG tablet Take 1 tablet (4 mg total) by mouth every 8 (eight) hours as needed for nausea or vomiting., Starting 02/27/2015, Until Discontinued, Print      STOP taking these medications     diazepam (VALIUM) 2 MG tablet        No Known Allergies Follow-up Information    Follow up with Hoyt Koch, MD. Schedule an appointment as soon as possible for a visit in 1 week.   Specialty: Internal Medicine   Contact information:   [place] [address] [phone]       Follow up with Norberto Sorenson T. Fuller Plan, MD. Schedule an appointment as soon as possible for a visit in 1 month.   Specialty: Gastroenterology   Why: follow up biopsy results   Contact information:   [address] [place] [address] [phone]       F/u proctitis w/abd pain and diarrhea. Bx showed ischemic colitis.   Outpatient Prescriptions Prior to Visit  Medication Sig Dispense Refill  . aspirin 81 MG tablet Take 81 mg by mouth daily.      . carboxymethylcellulose (REFRESH PLUS) 0.5 % SOLN Place 1 drop into both eyes 4 (four) times daily - after meals and at bedtime.    . ciprofloxacin (CIPRO) 500 MG tablet Take 1 tablet (500 mg total) by mouth 2 (two) times daily. 10 tablet 0  . cyanocobalamin 1000 MCG tablet Take 100 mcg by mouth daily.      . cyclobenzaprine (FLEXERIL) 10 MG tablet Take 10 mg by mouth 3 (three) times daily as needed. Muscle spasms    . dorzolamide-timolol (COSOPT) 22.3-6.8 MG/ML ophthalmic solution Place 1 drop into both eyes every 12 (twelve) hours.     . fish oil-omega-3 fatty acids 1000 MG capsule Take 2 g by mouth daily.      . mesalamine (CANASA) 1000 MG suppository Place 1 suppository (1,000 mg total) rectally at bedtime. 14 suppository 0  . metroNIDAZOLE (FLAGYL) 500 MG tablet Take 1 tablet (500 mg total) by  mouth 3 (three) times daily. 15 tablet 0  . Multiple Vitamin (MULTIVITAMIN) tablet Take 1 tablet by mouth daily.      . ondansetron (ZOFRAN) 4 MG tablet Take 1 tablet (4 mg total) by mouth every 8 (eight) hours as needed for nausea or vomiting. 20 tablet 0  . potassium chloride SA (K-DUR,KLOR-CON) 20 MEQ tablet Take 2 tablets (40 mEq total) by mouth 2 (two) times daily. 2 tablet 0  . mesalamine (CANASA) 1000 MG suppository Place 1 suppository (1,000 mg total) rectally 2 (two) times daily. 30 suppository 0   No facility-administered medications prior to visit.    ROS Review of Systems  Constitutional: Positive for unexpected weight change. Negative for appetite change and fatigue.  HENT: Negative for congestion, nosebleeds, sneezing, sore throat and trouble swallowing.   Eyes: Negative for itching and visual disturbance.  Respiratory: Negative for cough.   Cardiovascular: Negative for chest pain, palpitations and leg swelling.  Gastrointestinal: Positive for diarrhea. Negative for nausea, vomiting, abdominal pain, constipation, blood in stool, abdominal distention  and rectal pain.  Genitourinary: Negative for frequency and hematuria.  Musculoskeletal: Negative for back pain, joint swelling, gait problem, neck pain and neck stiffness.  Skin: Negative for rash.  Neurological: Negative for dizziness, tremors, speech difficulty and weakness.  Psychiatric/Behavioral: Negative for sleep disturbance, dysphoric mood and agitation. The patient is not nervous/anxious.     Objective:  BP 110/88 mmHg  Pulse 89  Wt 149 lb (67.586 kg)  SpO2 99%  BP Readings from Last 3 Encounters:  03/20/15 110/88  03/09/15 127/88  01/21/15 120/84    Wt Readings from Last 3 Encounters:  03/20/15 149 lb (67.586 kg)  03/08/15 154 lb 12.2 oz (70.2 kg)  01/21/15 156 lb (70.761 kg)    Physical Exam  Constitutional: He is oriented to person, place, and time. He appears well-developed. No distress.  NAD  HENT:   Mouth/Throat: Oropharynx is clear and moist.  Eyes: Conjunctivae are normal. Pupils are equal, round, and reactive to light.  Neck: Normal range of motion. No JVD present. No thyromegaly present.  Cardiovascular: Normal rate, regular rhythm, normal heart sounds and intact distal pulses.  Exam reveals no gallop and no friction rub.   No murmur heard. Pulmonary/Chest: Effort normal and breath sounds normal. No respiratory distress. He has no wheezes. He has no rales. He exhibits no tenderness.  Abdominal: Soft. Bowel sounds are normal. He exhibits no distension and no mass. There is no tenderness. There is no rebound and no guarding.  Musculoskeletal: Normal range of motion. He exhibits no edema or tenderness.  Lymphadenopathy:    He has no cervical adenopathy.  Neurological: He is alert and oriented to person, place, and time. He has normal reflexes. No cranial nerve deficit. He exhibits normal muscle tone. He displays a negative Romberg sign. Coordination and gait normal.  Skin: Skin is warm and dry. No rash noted.  Psychiatric: He has a normal mood and affect. His behavior is normal. Judgment and thought content normal.  abd sensitive  Lab Results  Component Value Date   WBC 8.7 03/09/2015   HGB 10.7* 03/09/2015   HCT 32.4* 03/09/2015   PLT 157 03/09/2015   GLUCOSE 83 03/09/2015   CHOL 238* 12/31/2014   TRIG 190.0* 12/31/2014   HDL 55.60 12/31/2014   LDLDIRECT 182.8 05/19/2013   LDLCALC 145* 12/31/2014   ALT 16* 03/08/2015   AST 33 03/08/2015   NA 140 03/09/2015   K 3.2* 03/09/2015   CL 112* 03/09/2015   CREATININE 0.95 03/09/2015   BUN 6 03/09/2015   CO2 22 03/09/2015   TSH 2.448 03/08/2015   PSA 3.13 05/30/2009    Dg Chest 2 View  03/07/2015  CLINICAL DATA:  Dehydration, shortness of breath, had diarrhea last week EXAM: CHEST  2 VIEW COMPARISON:  01/13/2010 FINDINGS: Cardiomediastinal silhouette is stable. Mild hyperinflation. No acute infiltrate or pleural effusion. No  pulmonary edema. IMPRESSION: No active cardiopulmonary disease.  Mild hyperinflation. Electronically Signed   By: Lahoma Crocker M.D.   On: 03/07/2015 15:02   Ct Abdomen Pelvis W Contrast  03/07/2015  CLINICAL DATA:  Low abdominal pain and constipation EXAM: CT ABDOMEN AND PELVIS WITH CONTRAST TECHNIQUE: Multidetector CT imaging of the abdomen and pelvis was performed using the standard protocol following bolus administration of intravenous contrast. CONTRAST:  124mL OMNIPAQUE IOHEXOL 300 MG/ML  SOLN COMPARISON:  CT 01/08/2015 FINDINGS: Lower chest: Lung bases are clear. Hepatobiliary: The liver is normal. No biliary duct dilatation. The gallbladder is mildly distended 3 cm. No inflammation. Common  bile duct normal. Pancreas: Pancreas is normal. No ductal dilatation. No pancreatic inflammation. Spleen: Normal spleen Adrenals/urinary tract: Adrenal glands normal. No ureterolithiasis or obstructive uropathy. Low-density lesion lower pole of the LEFT kidney is unchanged. Normal parenchymal enhancement. Stomach/Bowel: Stomach, duodenum, small bowel are normal. Appendix contains several appendicoliths (image 41, series 5) but no dilatation or inflammation. There is fluid stool through the colon. Several diverticula descending colon without acute inflammation. The distal 5 to 6 cm of the rectum demonstrates sub mucosal edema. Perirectal fat stranding and small amount fluid in the presacral space. No evidence of perforation or abscess. (findings evident on images 77-84 of series 2). Vascular/Lymphatic: Abdominal aorta is normal caliber. There is no retroperitoneal or periportal lymphadenopathy. No pelvic lymphadenopathy. Reproductive: Prostate normal Other: No significant ventral hernia Musculoskeletal: No aggressive osseous lesion. IMPRESSION: 1. A 6 cm segment of submucosal enhancement and perirectal inflammation involving the distal rectum to the anus. Findings are suggestive proctitis. Inflammatory bowel disease would  be a secondary consideration. 2. No evidence of perforation or abscess in the pelvis. Electronically Signed   By: Suzy Bouchard M.D.   On: 03/07/2015 19:53    Assessment & Plan:   There are no diagnoses linked to this encounter. I am having Mr. Rubley maintain his multivitamin, aspirin, fish oil-omega-3 fatty acids, cyanocobalamin, dorzolamide-timolol, ondansetron, carboxymethylcellulose, cyclobenzaprine, potassium chloride SA, metroNIDAZOLE, ciprofloxacin, mesalamine, and mesalamine.  No orders of the defined types were placed in this encounter.     Follow-up: No Follow-up on file.  Walker Kehr, MD

## 2015-03-20 NOTE — Assessment & Plan Note (Addendum)
12/16  proctitis w/abd pain and diarrhea. Bx showed ischemic colitis. Baby ASA, good hydration GI f/u

## 2015-03-20 NOTE — Progress Notes (Signed)
Pre visit review using our clinic review tool, if applicable. No additional management support is needed unless otherwise documented below in the visit note. 

## 2015-03-20 NOTE — Patient Instructions (Signed)
12/16  proctitis w/abd pain and diarrhea due to ischemic colitis.

## 2015-04-15 ENCOUNTER — Inpatient Hospital Stay: Payer: Medicare Other | Admitting: Internal Medicine

## 2015-04-17 ENCOUNTER — Ambulatory Visit: Payer: Medicare Other | Admitting: Gastroenterology

## 2015-04-29 ENCOUNTER — Ambulatory Visit (INDEPENDENT_AMBULATORY_CARE_PROVIDER_SITE_OTHER): Payer: Medicare Other | Admitting: Gastroenterology

## 2015-04-29 ENCOUNTER — Encounter: Payer: Self-pay | Admitting: Gastroenterology

## 2015-04-29 VITALS — BP 120/80 | HR 84 | Ht 68.25 in | Wt 151.1 lb

## 2015-04-29 DIAGNOSIS — K6289 Other specified diseases of anus and rectum: Secondary | ICD-10-CM | POA: Diagnosis not present

## 2015-04-29 DIAGNOSIS — R634 Abnormal weight loss: Secondary | ICD-10-CM | POA: Diagnosis not present

## 2015-04-29 NOTE — Progress Notes (Signed)
    History of Present Illness: This is an 82 year old male returning for follow-up after hospitalization. He was hospitalized for diarrhea, abdominal pain and nausea. CT scan of the abdomen revealed a short segment of perirectal inflammation suggestive of proctitis. A nonspecific proctitis and grade I internal hemorrhoids were noted on flexible sigmoidoscopy and biopsies were consistent with ischemic changes. He states he lost about 5-10 pounds during his hospitalization and has not regained the weight. He has no other gastrointestinal complaints.  Current Medications, Allergies, Past Medical History, Past Surgical History, Family History and Social History were reviewed in [employer] record.  Physical Exam: General: Well developed, well nourished, no acute distress Head: Normocephalic and atraumatic Eyes:  sclerae anicteric, EOMI Ears: Normal auditory acuity Mouth: No deformity or lesions Lungs: Clear throughout to auscultation Heart: Regular rate and rhythm; no murmurs, rubs or bruits Abdomen: Soft, non tender and non distended. No masses, hepatosplenomegaly or hernias noted. Normal Bowel sounds Musculoskeletal: Symmetrical with no gross deformities  Pulses:  Normal pulses noted Extremities: No clubbing, cyanosis, edema or deformities noted Neurological: Alert oriented x 4, grossly nonfocal Psychological:  Alert and cooperative. Normal mood and affect  Assessment and Recommendations:  1. Ischemic proctitis, symptoms resolved. No GI follow up needed  2. Weight loss 5-10 lbs during hospital stay but has not gained it back. Increase daily calorie intake, snacks, etc. Follow up with PCP.

## 2015-04-29 NOTE — Patient Instructions (Signed)
Thank you for choosing me and [facility].  Malcolm T. Stark, Jr., MD., FACG  

## 2015-05-30 DIAGNOSIS — R972 Elevated prostate specific antigen [PSA]: Secondary | ICD-10-CM | POA: Diagnosis not present

## 2015-05-30 DIAGNOSIS — Z0000 Encounter for general adult medical examination without abnormal findings: Secondary | ICD-10-CM | POA: Diagnosis not present

## 2015-07-22 ENCOUNTER — Encounter: Payer: Self-pay | Admitting: Internal Medicine

## 2015-07-22 ENCOUNTER — Ambulatory Visit (INDEPENDENT_AMBULATORY_CARE_PROVIDER_SITE_OTHER): Payer: Medicare Other | Admitting: Internal Medicine

## 2015-07-22 VITALS — BP 136/60 | HR 68 | Temp 97.6°F | Resp 14 | Ht 69.5 in | Wt 153.4 lb

## 2015-07-22 DIAGNOSIS — R634 Abnormal weight loss: Secondary | ICD-10-CM

## 2015-07-22 NOTE — Progress Notes (Signed)
Pre visit review using our clinic review tool, if applicable. No additional management support is needed unless otherwise documented below in the visit note. 

## 2015-07-22 NOTE — Patient Instructions (Signed)
We have cleaned out your ears today so that should help some.   If you are still having problems with the appetite you can use carnation breakfast or ensure to help with adding calories during the day or between meals to get some of the weight back.  Start off slow at [employer] with 5-10 minutes of exercise on the treadmill (going slowly) or the stationary bike.

## 2015-07-23 DIAGNOSIS — R634 Abnormal weight loss: Secondary | ICD-10-CM | POA: Insufficient documentation

## 2015-07-23 NOTE — Progress Notes (Signed)
   Subjective:    Patient ID: Tommy Browning., male    DOB: Jul 29, 1929, 82 y.o.   MRN: [id number]  HPI The patient is an 82 YO man coming in for weight loss and appetite loss for several months since his hospitalization in December. He had lost about 10 pounds to his estimation. He has been forcing himself to eat but is not eating a lot. He has gradually been increasing his exercise but gets tired easily. He did eat well yesterday and is feeling good this morning. Weight per scales today is up 3 pounds from last visit in January. His stomach is back to normal again.   Review of Systems  Constitutional: Positive for activity change, appetite change and fatigue. Negative for fever, chills and unexpected weight change.  Respiratory: Negative.   Cardiovascular: Negative.   Gastrointestinal: Negative.   Musculoskeletal: Negative.   Neurological: Negative.   Psychiatric/Behavioral: Negative.       Objective:   Physical Exam  Constitutional: He is oriented to person, place, and time. He appears well-developed and well-nourished.  HENT:  Head: Normocephalic and atraumatic.  Neck: Normal range of motion.  Cardiovascular: Normal rate and regular rhythm.   Pulmonary/Chest: Effort normal and breath sounds normal. No respiratory distress. He has no wheezes. He has no rales.  Abdominal: Soft. Bowel sounds are normal. He exhibits no distension. There is no tenderness. There is no rebound.  Musculoskeletal: He exhibits no edema.  Neurological: He is alert and oriented to person, place, and time.  Skin: Skin is warm and dry.   Filed Vitals:   07/22/15 1442  BP: 136/60  Pulse: 68  Temp: 97.6 F (36.4 C)  TempSrc: Oral  Resp: 14  Height: 5' 9.5" (1.765 m)  Weight: 153 lb 6.4 oz (69.582 kg)  SpO2: 97%      Assessment & Plan:

## 2015-07-23 NOTE — Assessment & Plan Note (Signed)
He is up about 3 pounds since last visit which is reassuring along with his appetite returning. He is working on increasing his endurance and staying well hydrated. Advised him that he can try carnation meals or ensure to help with making sure he gets the right calories and nutrients. He will try that.

## 2015-07-24 ENCOUNTER — Telehealth: Payer: Self-pay | Admitting: Internal Medicine

## 2015-07-24 NOTE — Telephone Encounter (Signed)
Patient states when he was last in Dr. Sharlet Salina told him she was going to send a script for ear drops over to his pharmacy at [facility] on [address]. States pharmacy does not have a script.  Please follow up with patient in regards.

## 2015-07-24 NOTE — Telephone Encounter (Signed)
Please send rx for ear drops, thanks. This is the patient from the other day that I was not able to get all of the ear wax out of his ears.

## 2015-07-25 MED ORDER — CARBAMIDE PEROXIDE 6.5 % OT SOLN: 5.0000 [drp] | Freq: Two times a day (BID) | OTIC | Status: DC

## 2015-07-25 NOTE — Telephone Encounter (Signed)
Sent in

## 2015-11-05 DIAGNOSIS — H5000 Unspecified esotropia: Secondary | ICD-10-CM | POA: Diagnosis not present

## 2015-11-05 DIAGNOSIS — H401122 Primary open-angle glaucoma, left eye, moderate stage: Secondary | ICD-10-CM | POA: Diagnosis not present

## 2015-11-05 DIAGNOSIS — H40051 Ocular hypertension, right eye: Secondary | ICD-10-CM | POA: Diagnosis not present

## 2015-11-05 DIAGNOSIS — H25813 Combined forms of age-related cataract, bilateral: Secondary | ICD-10-CM | POA: Diagnosis not present

## 2015-12-03 DIAGNOSIS — R972 Elevated prostate specific antigen [PSA]: Secondary | ICD-10-CM | POA: Diagnosis not present

## 2015-12-04 ENCOUNTER — Encounter: Payer: Self-pay | Admitting: Nurse Practitioner

## 2015-12-04 ENCOUNTER — Ambulatory Visit (INDEPENDENT_AMBULATORY_CARE_PROVIDER_SITE_OTHER): Payer: Medicare Other | Admitting: Nurse Practitioner

## 2015-12-04 VITALS — BP 126/84 | HR 80 | Temp 98.6°F | Ht 70.0 in | Wt 156.0 lb

## 2015-12-04 DIAGNOSIS — H6123 Impacted cerumen, bilateral: Secondary | ICD-10-CM | POA: Diagnosis not present

## 2015-12-04 DIAGNOSIS — J069 Acute upper respiratory infection, unspecified: Secondary | ICD-10-CM

## 2015-12-04 MED ORDER — IPRATROPIUM BROMIDE 0.03 % NA SOLN: 2.0000 | Freq: Two times a day (BID) | NASAL | 12 refills | Status: DC

## 2015-12-04 MED ORDER — GUAIFENESIN ER 600 MG PO TB12: 600.0000 mg | ORAL_TABLET | Freq: Two times a day (BID) | ORAL | 0 refills | Status: DC | PRN

## 2015-12-04 MED ORDER — IBUPROFEN 200 MG PO TABS: 200.0000 mg | ORAL_TABLET | Freq: Four times a day (QID) | ORAL | 0 refills | Status: DC | PRN

## 2015-12-04 MED ORDER — CETIRIZINE HCL 10 MG PO TABS: 10.0000 mg | ORAL_TABLET | Freq: Every day | ORAL | 0 refills | Status: DC

## 2015-12-04 MED ORDER — CEPHALEXIN 500 MG PO CAPS: 500.0000 mg | ORAL_CAPSULE | Freq: Three times a day (TID) | ORAL | 0 refills | Status: DC

## 2015-12-04 NOTE — Progress Notes (Signed)
Pre visit review using our clinic review tool, if applicable. No additional management support is needed unless otherwise documented below in the visit note. 

## 2015-12-04 NOTE — Patient Instructions (Addendum)
[facility] Instructions: Use over-the-counter  "cold" medicines  such as "Tylenol cold" , "Advil cold",  "Mucinex" or" Mucinex D"  for cough and congestion.  Avoid decongestants if you have high blood pressure. Use" Delsym" or" Robitussin" cough syrup varietis for cough.  You can use plain "Tylenol" or "Advi"l for fever, chills and achyness.   "Common cold" symptoms are usually triggered by a virus.  The antibiotics are usually not necessary. On average, a" viral cold" illness would take 4-7 days to resolve. Please, make an appointment if you are not better or if you're worse.

## 2015-12-04 NOTE — Progress Notes (Signed)
Subjective:  Patient ID: Tommy Luke., male    DOB: 1930-03-28  Age: 82 y.o. MRN: [id number]  CC: Sore Throat (congestion, chills, and body aches x 1 weeks.  Sxs have improved over the last 2 days)   Sore Throat   This is a new problem. The current episode started in the past 7 days. The problem has been rapidly improving. There has been no fever. The pain is at a severity of 0/10. The patient is experiencing no pain. Associated symptoms include congestion, headaches and a plugged ear sensation. Pertinent negatives include no coughing, ear discharge, ear pain, hoarse voice, shortness of breath, swollen glands, trouble swallowing or vomiting. He has had no exposure to strep or mono. He has tried acetaminophen and NSAIDs for the symptoms. The treatment provided significant relief.    Outpatient Medications Prior to Visit  Medication Sig Dispense Refill  . aspirin 81 MG tablet Take 81 mg by mouth daily.      . carbamide peroxide (DEBROX) 6.5 % otic solution Place 5 drops into both ears 2 (two) times daily. 15 mL 0  . carboxymethylcellulose (REFRESH PLUS) 0.5 % SOLN Place 1 drop into both eyes 4 (four) times daily - after meals and at bedtime.    . cyanocobalamin 1000 MCG tablet Take 100 mcg by mouth daily.      . dorzolamide-timolol (COSOPT) 22.3-6.8 MG/ML ophthalmic solution Place 1 drop into both eyes every 12 (twelve) hours.     . fish oil-omega-3 fatty acids 1000 MG capsule Take 2 g by mouth daily.      . Multiple Vitamin (MULTIVITAMIN) tablet Take 1 tablet by mouth daily.       No facility-administered medications prior to visit.     ROS See HPI  Procedure Note :     Procedure :  Ear irrigation   Indication:  Cerumen impaction (bilateral)   Risks, including pain, dizziness, eardrum perforation, bleeding, infection and others as well as benefits were explained to the patient in detail. Verbal consent was obtained and the patient agreed to proceed.    We used "The Elephant Ear  Irrigation Device" filled with lukewarm water for irrigation. A large amount wax was recovered. Procedure has also required manual wax removal with an ear loop.   Tolerated well. Complications: None.   Postprocedure instructions :  Call if problems.  Objective:  BP 126/84 (BP Location: Left Arm, Patient Position: Sitting, Cuff Size: Normal)   Pulse 80   Temp 98.6 F (37 C) (Oral)   Ht 5\' 10"  (1.778 m)   Wt 156 lb (70.8 kg)   SpO2 99%   BMI 22.38 kg/m   BP Readings from Last 3 Encounters:  12/04/15 126/84  07/22/15 136/60  04/29/15 120/80    Wt Readings from Last 3 Encounters:  12/04/15 156 lb (70.8 kg)  07/22/15 153 lb 6.4 oz (69.6 kg)  04/29/15 151 lb 2 oz (68.5 kg)    Physical Exam  Constitutional: He is oriented to person, place, and time. No distress.  HENT:  Right Ear: Tympanic membrane, external ear and ear canal normal.  Left Ear: Tympanic membrane, external ear and ear canal normal.  Nose: Mucosal edema and rhinorrhea present.  Mouth/Throat: Posterior oropharyngeal erythema present. No oropharyngeal exudate.  Eyes: No scleral icterus.  Neck: Normal range of motion. Neck supple.  Cardiovascular: Normal rate and normal heart sounds.   Pulmonary/Chest: Effort normal and breath sounds normal.  Musculoskeletal: He exhibits no edema.  Lymphadenopathy:  He has no cervical adenopathy.  Neurological: He is alert and oriented to person, place, and time.  Skin: Skin is warm and dry.  Vitals reviewed.   Lab Results  Component Value Date   WBC 2.8 (L) 03/20/2015   HGB 14.6 03/20/2015   HCT 44.2 03/20/2015   PLT 399.0 03/20/2015   GLUCOSE 89 03/20/2015   CHOL 238 (H) 12/31/2014   TRIG 190.0 (H) 12/31/2014   HDL 55.60 12/31/2014   LDLDIRECT 182.8 05/19/2013   LDLCALC 145 (H) 12/31/2014   ALT 16 (L) 03/08/2015   AST 33 03/08/2015   NA 137 03/20/2015   K 4.3 03/20/2015   CL 102 03/20/2015   CREATININE 1.13 03/20/2015   BUN 10 03/20/2015   CO2 28 03/20/2015     TSH 2.448 03/08/2015   PSA 3.13 05/30/2009    Dg Chest 2 View  Result Date: 03/07/2015 CLINICAL DATA:  Dehydration, shortness of breath, had diarrhea last week EXAM: CHEST  2 VIEW COMPARISON:  01/13/2010 FINDINGS: Cardiomediastinal silhouette is stable. Mild hyperinflation. No acute infiltrate or pleural effusion. No pulmonary edema. IMPRESSION: No active cardiopulmonary disease.  Mild hyperinflation. Electronically Signed   By: Lahoma Crocker M.D.   On: 03/07/2015 15:02   Ct Abdomen Pelvis W Contrast  Result Date: 03/07/2015 CLINICAL DATA:  Low abdominal pain and constipation EXAM: CT ABDOMEN AND PELVIS WITH CONTRAST TECHNIQUE: Multidetector CT imaging of the abdomen and pelvis was performed using the standard protocol following bolus administration of intravenous contrast. CONTRAST:  183mL OMNIPAQUE IOHEXOL 300 MG/ML  SOLN COMPARISON:  CT 01/08/2015 FINDINGS: Lower chest: Lung bases are clear. Hepatobiliary: The liver is normal. No biliary duct dilatation. The gallbladder is mildly distended 3 cm. No inflammation. Common bile duct normal. Pancreas: Pancreas is normal. No ductal dilatation. No pancreatic inflammation. Spleen: Normal spleen Adrenals/urinary tract: Adrenal glands normal. No ureterolithiasis or obstructive uropathy. Low-density lesion lower pole of the LEFT kidney is unchanged. Normal parenchymal enhancement. Stomach/Bowel: Stomach, duodenum, small bowel are normal. Appendix contains several appendicoliths (image 41, series 5) but no dilatation or inflammation. There is fluid stool through the colon. Several diverticula descending colon without acute inflammation. The distal 5 to 6 cm of the rectum demonstrates sub mucosal edema. Perirectal fat stranding and small amount fluid in the presacral space. No evidence of perforation or abscess. (findings evident on images 77-84 of series 2). Vascular/Lymphatic: Abdominal aorta is normal caliber. There is no retroperitoneal or periportal  lymphadenopathy. No pelvic lymphadenopathy. Reproductive: Prostate normal Other: No significant ventral hernia Musculoskeletal: No aggressive osseous lesion. IMPRESSION: 1. A 6 cm segment of submucosal enhancement and perirectal inflammation involving the distal rectum to the anus. Findings are suggestive proctitis. Inflammatory bowel disease would be a secondary consideration. 2. No evidence of perforation or abscess in the pelvis. Electronically Signed   By: Suzy Bouchard M.D.   On: 03/07/2015 19:53    Assessment & Plan:   Tommy Browning was seen today for sore throat.  Diagnoses and all orders for this visit:  Acute URI -     ipratropium (ATROVENT) 0.03 % nasal spray; Place 2 sprays into both nostrils 2 (two) times daily. Do not use for more than 5days. -     guaiFENesin (MUCINEX) 600 MG 12 hr tablet; Take 1 tablet (600 mg total) by mouth 2 (two) times daily as needed for cough or to loosen phlegm. -     ibuprofen (MOTRIN IB) 200 MG tablet; Take 1 tablet (200 mg total) by mouth every 6 (six)  hours as needed for headache (take with food). -     cetirizine (ZYRTEC) 10 MG tablet; Take 1 tablet (10 mg total) by mouth daily.  Cerumen impaction, bilateral  Other orders -     Discontinue: cephALEXin (KEFLEX) 500 MG capsule; Take 1 capsule (500 mg total) by mouth 3 (three) times daily. With food -     Discontinue: cephALEXin (KEFLEX) 500 MG capsule; Take 1 capsule (500 mg total) by mouth 3 (three) times daily. With food   I have discontinued Tommy Browning cephALEXin and cephALEXin. I am also having him start on ipratropium, guaiFENesin, ibuprofen, and cetirizine. Additionally, I am having him maintain his multivitamin, aspirin, fish oil-omega-3 fatty acids, cyanocobalamin, dorzolamide-timolol, carboxymethylcellulose, and carbamide peroxide.  Meds ordered this encounter  Medications  . DISCONTD: cephALEXin (KEFLEX) 500 MG capsule    Sig: Take 1 capsule (500 mg total) by mouth 3 (three) times daily. With  food    Dispense:  30 capsule    Refill:  0    Order Specific Question:   Supervising Provider    Answer:   Cassandria Anger [[id number]]  . DISCONTD: cephALEXin (KEFLEX) 500 MG capsule    Sig: Take 1 capsule (500 mg total) by mouth 3 (three) times daily. With food    Dispense:  30 capsule    Refill:  0    Order Specific Question:   Supervising Provider    Answer:   Cassandria Anger [[id number]]  . ipratropium (ATROVENT) 0.03 % nasal spray    Sig: Place 2 sprays into both nostrils 2 (two) times daily. Do not use for more than 5days.    Dispense:  30 mL    Refill:  12    Order Specific Question:   Supervising Provider    Answer:   Cassandria Anger [[id number]]  . guaiFENesin (MUCINEX) 600 MG 12 hr tablet    Sig: Take 1 tablet (600 mg total) by mouth 2 (two) times daily as needed for cough or to loosen phlegm.    Dispense:  14 tablet    Refill:  0    Order Specific Question:   Supervising Provider    Answer:   Cassandria Anger [[id number]]  . ibuprofen (MOTRIN IB) 200 MG tablet    Sig: Take 1 tablet (200 mg total) by mouth every 6 (six) hours as needed for headache (take with food).    Dispense:  30 tablet    Refill:  0    Order Specific Question:   Supervising Provider    Answer:   Cassandria Anger [[id number]]  . cetirizine (ZYRTEC) 10 MG tablet    Sig: Take 1 tablet (10 mg total) by mouth daily.    Dispense:  14 tablet    Refill:  0    Order Specific Question:   Supervising Provider    Answer:   Cassandria Anger [[id number]]    Follow-up: Return if symptoms worsen or fail to improve.  Wilfred Lacy, NP

## 2015-12-11 ENCOUNTER — Other Ambulatory Visit: Payer: Self-pay | Admitting: Urology

## 2015-12-11 DIAGNOSIS — R972 Elevated prostate specific antigen [PSA]: Secondary | ICD-10-CM

## 2015-12-20 DIAGNOSIS — J069 Acute upper respiratory infection, unspecified: Secondary | ICD-10-CM | POA: Diagnosis not present

## 2015-12-31 ENCOUNTER — Ambulatory Visit
Admission: RE | Admit: 2015-12-31 | Discharge: 2015-12-31 | Disposition: A | Discharge status: Home / Self Care | Payer: Medicare Other | Source: Ambulatory Visit | Attending: Urology | Admitting: Urology

## 2015-12-31 DIAGNOSIS — R972 Elevated prostate specific antigen [PSA]: Secondary | ICD-10-CM

## 2015-12-31 MED ORDER — GADOBENATE DIMEGLUMINE 529 MG/ML IV SOLN: 14.0000 mL | Freq: Once | INTRAVENOUS | Status: DC | PRN

## 2016-01-01 ENCOUNTER — Other Ambulatory Visit (INDEPENDENT_AMBULATORY_CARE_PROVIDER_SITE_OTHER): Payer: Medicare Other

## 2016-01-01 ENCOUNTER — Ambulatory Visit (INDEPENDENT_AMBULATORY_CARE_PROVIDER_SITE_OTHER): Payer: Medicare Other | Admitting: Internal Medicine

## 2016-01-01 ENCOUNTER — Encounter: Payer: Self-pay | Admitting: Internal Medicine

## 2016-01-01 VITALS — BP 128/86 | HR 82 | Temp 98.5°F | Resp 14 | Ht 70.0 in | Wt 152.8 lb

## 2016-01-01 DIAGNOSIS — E789 Disorder of lipoprotein metabolism, unspecified: Secondary | ICD-10-CM

## 2016-01-01 DIAGNOSIS — Z0000 Encounter for general adult medical examination without abnormal findings: Secondary | ICD-10-CM | POA: Diagnosis not present

## 2016-01-01 DIAGNOSIS — Z23 Encounter for immunization: Secondary | ICD-10-CM | POA: Diagnosis not present

## 2016-01-01 LAB — LIPID PANEL
Cholesterol: 227 mg/dL — ABNORMAL HIGH (ref 0–200)
HDL: 52.7 mg/dL (ref >39.00)
LDL Cholesterol: 148 mg/dL — ABNORMAL HIGH (ref 0–99)
NONHDL: 174.23
TRIGLYCERIDES: 133 mg/dL (ref 0.0–149.0)
Total CHOL/HDL Ratio: 4
VLDL: 26.6 mg/dL (ref 0.0–40.0)

## 2016-01-01 LAB — COMPREHENSIVE METABOLIC PANEL
ALK PHOS: 58 U/L (ref 39–117)
ALT: 16 U/L (ref 0–53)
AST: 25 U/L (ref 0–37)
Albumin: 4 g/dL (ref 3.5–5.2)
BILIRUBIN TOTAL: 0.6 mg/dL (ref 0.2–1.2)
BUN: 14 mg/dL (ref 6–23)
CALCIUM: 9.2 mg/dL (ref 8.4–10.5)
CO2: 31 mEq/L (ref 19–32)
Chloride: 104 mEq/L (ref 96–112)
Creatinine, Ser: 1.01 mg/dL (ref 0.40–1.50)
GFR: 90.44 mL/min (ref >60.00)
Glucose, Bld: 89 mg/dL (ref 70–99)
POTASSIUM: 3.9 meq/L (ref 3.5–5.1)
Sodium: 140 mEq/L (ref 135–145)
TOTAL PROTEIN: 7.3 g/dL (ref 6.0–8.3)

## 2016-01-01 LAB — CBC
HCT: 43.1 % (ref 39.0–52.0)
HEMOGLOBIN: 14.6 g/dL (ref 13.0–17.0)
MCHC: 33.8 g/dL (ref 30.0–36.0)
MCV: 89.8 fl (ref 78.0–100.0)
PLATELETS: 247 10*3/uL (ref 150.0–400.0)
RBC: 4.8 Mil/uL (ref 4.22–5.81)
RDW: 14.1 % (ref 11.5–15.5)
WBC: 3.7 10*3/uL — AB (ref 4.0–10.5)

## 2016-01-01 NOTE — Progress Notes (Signed)
   Subjective:    Patient ID: Tommy Luke., male    DOB: [birth year], 82 y.o.   MRN: [id number]  HPI Here for medicare wellness, no new complaints. Please see A/P for status and treatment of chronic medical problems.   Diet: heart healthy Physical activity: sedentary Depression/mood screen: negative Hearing: intact to whispered voice, mild loss Visual acuity: grossly normal with lens, some diplopia sees Dr. Katy Fitch performs annual eye exam  ADLs: capable Fall risk: low Home safety: good Cognitive evaluation: intact to orientation, naming, recall and repetition EOL planning: adv directives discussed, in place  I have personally reviewed and have noted 1. The patient's medical and social history - reviewed today no changes 2. Their use of alcohol, tobacco or illicit drugs 3. Their current medications and supplements 4. The patient's functional ability including ADL's, fall risks, home safety risks and hearing or visual impairment. 5. Diet and physical activities 6. Evidence for depression or mood disorders 7. Care team reviewed and updated (available in snapshot)  Review of Systems  Constitutional: Negative for activity change, appetite change, chills, fatigue, fever and unexpected weight change.  HENT: Negative.   Eyes: Negative.   Respiratory: Negative for cough, chest tightness and shortness of breath.   Cardiovascular: Negative for chest pain, palpitations and leg swelling.  Gastrointestinal: Negative for abdominal distention, abdominal pain, constipation, diarrhea and nausea.  Musculoskeletal: Negative.   Skin: Negative.   Neurological: Negative.   Psychiatric/Behavioral: Negative.       Objective:   Physical Exam  Constitutional: He is oriented to person, place, and time. He appears well-developed and well-nourished.  HENT:  Head: Normocephalic and atraumatic.  Neck: Normal range of motion.  Cardiovascular: Normal rate and regular rhythm.   No murmur  heard. Pulmonary/Chest: Effort normal and breath sounds normal. No respiratory distress. He has no wheezes. He has no rales.  Abdominal: Soft. Bowel sounds are normal. He exhibits no distension. There is no tenderness. There is no rebound.  Musculoskeletal: He exhibits no edema.  Neurological: He is alert and oriented to person, place, and time.  Skin: Skin is warm and dry.   Vitals:   01/01/16 0935  BP: 128/86  Pulse: 82  Resp: 14  Temp: 98.5 F (36.9 C)  TempSrc: Oral  SpO2: 90%  Weight: 152 lb 12.8 oz (69.3 kg)  Height: 5\' 10"  (1.778 m)      Assessment & Plan:  Flu and tdap given at visit.

## 2016-01-01 NOTE — Assessment & Plan Note (Signed)
Checking lipid panel and adjust as needed.  

## 2016-01-01 NOTE — Patient Instructions (Signed)
The spot on your back is a lipoma which is a fatty spot which is never cancerous. They can sometimes grow with time but do not need to be removed unless they are bothering you.  Health Maintenance, Male A healthy lifestyle and preventative care can promote health and wellness.  Maintain regular health, dental, and eye exams.  Eat a healthy diet. Foods like vegetables, fruits, whole grains, low-fat dairy products, and lean protein foods contain the nutrients you need and are low in calories. Decrease your intake of foods high in solid fats, added sugars, and salt. Get information about a proper diet from your health care provider, if necessary.  Regular physical exercise is one of the most important things you can do for your health. Most adults should get at least 150 minutes of moderate-intensity exercise (any activity that increases your heart rate and causes you to sweat) each week. In addition, most adults need muscle-strengthening exercises on 2 or more days a week.   Maintain a healthy weight. The body mass index (BMI) is a screening tool to identify possible weight problems. It provides an estimate of body fat based on height and weight. Your health care provider can find your BMI and can help you achieve or maintain a healthy weight. For males 20 years and older:  A BMI below 18.5 is considered underweight.  A BMI of 18.5 to 24.9 is normal.  A BMI of 25 to 29.9 is considered overweight.  A BMI of 30 and above is considered obese.  Maintain normal blood lipids and cholesterol by exercising and minimizing your intake of saturated fat. Eat a balanced diet with plenty of fruits and vegetables. Blood tests for lipids and cholesterol should begin at age 46 and be repeated every 5 years. If your lipid or cholesterol levels are high, you are over age 10, or you are at high risk for heart disease, you may need your cholesterol levels checked more frequently.Ongoing high lipid and cholesterol  levels should be treated with medicines if diet and exercise are not working.  If you smoke, find out from your health care provider how to quit. If you do not use tobacco, do not start.  Lung cancer screening is recommended for adults aged 98-80 years who are at high risk for developing lung cancer because of a history of smoking. A yearly low-dose CT scan of the lungs is recommended for people who have at least a 30-pack-year history of smoking and are current smokers or have quit within the past 15 years. A pack year of smoking is smoking an average of 1 pack of cigarettes a day for 1 year (for example, a 30-pack-year history of smoking could mean smoking 1 pack a day for 30 years or 2 packs a day for 15 years). Yearly screening should continue until the smoker has stopped smoking for at least 15 years. Yearly screening should be stopped for people who develop a health problem that would prevent them from having lung cancer treatment.  If you choose to drink alcohol, do not have more than 2 drinks per day. One drink is considered to be 12 oz (360 mL) of beer, 5 oz (150 mL) of wine, or 1.5 oz (45 mL) of liquor.  Avoid the use of street drugs. Do not share needles with anyone. Ask for help if you need support or instructions about stopping the use of drugs.  High blood pressure causes heart disease and increases the risk of stroke. High blood pressure  is more likely to develop in:  People who have blood pressure in the end of the normal range (100-139/85-89 mm Hg).  People who are overweight or obese.  People who are African American.  If you are 55-32 years of age, have your blood pressure checked every 3-5 years. If you are 26 years of age or older, have your blood pressure checked every year. You should have your blood pressure measured twice--once when you are at a hospital or clinic, and once when you are not at a hospital or clinic. Record the average of the two measurements. To check your  blood pressure when you are not at a hospital or clinic, you can use:  An automated blood pressure machine at a pharmacy.  A home blood pressure monitor.  If you are 40-4 years old, ask your health care provider if you should take aspirin to prevent heart disease.  Diabetes screening involves taking a blood sample to check your fasting blood sugar level. This should be done once every 3 years after age 54 if you are at a normal weight and without risk factors for diabetes. Testing should be considered at a younger age or be carried out more frequently if you are overweight and have at least 1 risk factor for diabetes.  Colorectal cancer can be detected and often prevented. Most routine colorectal cancer screening begins at the age of 98 and continues through age 30. However, your health care provider may recommend screening at an earlier age if you have risk factors for colon cancer. On a yearly basis, your health care provider may provide home test kits to check for hidden blood in the stool. A small camera at the end of a tube may be used to directly examine the colon (sigmoidoscopy or colonoscopy) to detect the earliest forms of colorectal cancer. Talk to your health care provider about this at age 40 when routine screening begins. A direct exam of the colon should be repeated every 5-10 years through age 27, unless early forms of precancerous polyps or small growths are found.  People who are at an increased risk for hepatitis B should be screened for this virus. You are considered at high risk for hepatitis B if:  You were born in a country where hepatitis B occurs often. Talk with your health care provider about which countries are considered high risk.  Your parents were born in a high-risk country and you have not received a shot to protect against hepatitis B (hepatitis B vaccine).  You have HIV or AIDS.  You use needles to inject street drugs.  You live with, or have sex with,  someone who has hepatitis B.  You are a man who has sex with other men (MSM).  You get hemodialysis treatment.  You take certain medicines for conditions like cancer, organ transplantation, and autoimmune conditions.  Hepatitis C blood testing is recommended for all people born from [birth year] through 1963 and any individual with known risk factors for hepatitis C.  Healthy men should no longer receive prostate-specific antigen (PSA) blood tests as part of routine cancer screening. Talk to your health care provider about prostate cancer screening.  Testicular cancer screening is not recommended for adolescents or adult males who have no symptoms. Screening includes self-exam, a health care provider exam, and other screening tests. Consult with your health care provider about any symptoms you have or any concerns you have about testicular cancer.  Practice safe sex. Use condoms and avoid high-risk  sexual practices to reduce the spread of sexually transmitted infections (STIs).  You should be screened for STIs, including gonorrhea and chlamydia if:  You are sexually active and are younger than 24 years.  You are older than 24 years, and your health care provider tells you that you are at risk for this type of infection.  Your sexual activity has changed since you were last screened, and you are at an increased risk for chlamydia or gonorrhea. Ask your health care provider if you are at risk.  If you are at risk of being infected with HIV, it is recommended that you take a prescription medicine daily to prevent HIV infection. This is called pre-exposure prophylaxis (PrEP). You are considered at risk if:  You are a man who has sex with other men (MSM).  You are a heterosexual man who is sexually active with multiple partners.  You take drugs by injection.  You are sexually active with a partner who has HIV.  Talk with your health care provider about whether you are at high risk of being  infected with HIV. If you choose to begin PrEP, you should first be tested for HIV. You should then be tested every 3 months for as long as you are taking PrEP.  Use sunscreen. Apply sunscreen liberally and repeatedly throughout the day. You should seek shade when your shadow is shorter than you. Protect yourself by wearing long sleeves, pants, a wide-brimmed hat, and sunglasses year round whenever you are outdoors.  Tell your health care provider of new moles or changes in moles, especially if there is a change in shape or color. Also, tell your health care provider if a mole is larger than the size of a pencil eraser.  A one-time screening for abdominal aortic aneurysm (AAA) and surgical repair of large AAAs by ultrasound is recommended for men aged 22-75 years who are current or former smokers.  Stay current with your vaccines (immunizations).   This information is not intended to replace advice given to you by your health care provider. Make sure you discuss any questions you have with your health care provider.   Document Released: 09/12/2007 Document Revised: 04/06/2014 Document Reviewed: 08/11/2010 Elsevier Interactive Patient Education [employer].

## 2016-01-01 NOTE — Assessment & Plan Note (Signed)
Seeing urology and recent MR prostate and will follow up with them for results.

## 2016-01-01 NOTE — Assessment & Plan Note (Signed)
Given flu and tdap to update immunizations which are up to date. Colonoscopy aged out. Sun safety and mole surveillance with check done today. Given 10 year screening recommendations. Pneumonia and shingles done.

## 2016-01-01 NOTE — Progress Notes (Signed)
Pre visit review using our clinic review tool, if applicable. No additional management support is needed unless otherwise documented below in the visit note. 

## 2016-01-01 NOTE — Addendum Note (Signed)
Addended by: Resa Miner R on: 01/01/2016 01:59 PM   Modules accepted: Orders

## 2016-01-14 DIAGNOSIS — R972 Elevated prostate specific antigen [PSA]: Secondary | ICD-10-CM | POA: Diagnosis not present

## 2016-01-15 DIAGNOSIS — H401111 Primary open-angle glaucoma, right eye, mild stage: Secondary | ICD-10-CM | POA: Diagnosis not present

## 2016-01-15 DIAGNOSIS — H25813 Combined forms of age-related cataract, bilateral: Secondary | ICD-10-CM | POA: Diagnosis not present

## 2016-01-15 DIAGNOSIS — H401122 Primary open-angle glaucoma, left eye, moderate stage: Secondary | ICD-10-CM | POA: Diagnosis not present

## 2016-03-06 ENCOUNTER — Other Ambulatory Visit: Payer: Self-pay

## 2016-04-07 ENCOUNTER — Encounter (HOSPITAL_COMMUNITY): Payer: Self-pay | Admitting: Family Medicine

## 2016-04-07 ENCOUNTER — Ambulatory Visit (HOSPITAL_COMMUNITY)
Admission: EM | Admit: 2016-04-07 | Discharge: 2016-04-07 | Disposition: A | Discharge status: Home / Self Care | Payer: Medicare Other | Attending: Family Medicine | Admitting: Family Medicine

## 2016-04-07 ENCOUNTER — Ambulatory Visit (INDEPENDENT_AMBULATORY_CARE_PROVIDER_SITE_OTHER): Payer: Medicare Other

## 2016-04-07 DIAGNOSIS — J40 Bronchitis, not specified as acute or chronic: Secondary | ICD-10-CM | POA: Diagnosis not present

## 2016-04-07 DIAGNOSIS — R05 Cough: Secondary | ICD-10-CM | POA: Diagnosis not present

## 2016-04-07 MED ORDER — AZITHROMYCIN 250 MG PO TABS: 250.0000 mg | ORAL_TABLET | Freq: Every day | ORAL | 0 refills | Status: DC

## 2016-04-07 MED ORDER — PREDNISONE 10 MG (21) PO TBPK: ORAL_TABLET | ORAL | 0 refills | Status: DC

## 2016-04-07 MED ORDER — BENZONATATE 100 MG PO CAPS: 100.0000 mg | ORAL_CAPSULE | Freq: Three times a day (TID) | ORAL | 0 refills | Status: DC

## 2016-04-07 NOTE — ED Triage Notes (Signed)
Pt here for flu like symptoms x 2 weeks. sts head and chest cold. sts cant get rid of the symptoms.

## 2016-04-07 NOTE — Discharge Instructions (Signed)
Your chest Xray was negative for any acute findings such as pneumonia. You are being treated for bronchitis. I have prescribed Azithromycin, take 2 tablets today then 1 tablet daily for the remaining prescription. I have prescribed Tessalon for cough, take 3 times a day. I have also prescribed a prednisone dose pack. Take 6 tablets in the morning then decrease by 1 each day till finished. Take Mucinex over the counter twice a day with a full glass of water. Should your symptoms fail to improve or worsen follow up with your primary care provider or return to clinic.

## 2016-04-07 NOTE — ED Provider Notes (Signed)
CSN: [id number]     Arrival date & time 04/07/16  1552 History   First MD Initiated Contact with Patient 04/07/16 1747     Chief Complaint  Patient presents with  . Cough   (Consider location/radiation/quality/duration/timing/severity/associated sxs/prior Treatment) 83 year old male presents to clinic with 2 week history of cough and fatigue. States he feels like he has not been able to get any better. He denies congestion, sore throat, fever, or body aches. Cough is described as productive with brown sputum. He also complains of increased fatigue, and exertional dyspnea.   The history is provided by the patient.  Cough  Associated symptoms: rhinorrhea and shortness of breath   Associated symptoms: no chills, no fever, no sore throat and no wheezing     Past Medical History:  Diagnosis Date  . BACK PAIN 02/24/2008  . Diplopia   . Diverticulosis   . ELECTROCARDIOGRAM, ABNORMAL 12/08/2006   F/U by a normal stress test on September 2005  . ERECTILE DYSFUNCTION, ORGANIC 05/30/2009  . History of thyroid nodule    Resolved by [place] 11/2006  . [place], MILD 05/30/2009  . HYPERTHYROIDISM 12/08/2006   [place]  . HYPERTROPHY PROSTATE W/UR OBST & OTH LUTS 02/26/2009  . HYPOGONADISM 02/03/2010  . Ischemic colitis ([place])   . Tubular adenoma of colon 2004   Past Surgical History:  Procedure Laterality Date  . COLONOSCOPY    . FLEXIBLE SIGMOIDOSCOPY N/A 03/08/2015   Procedure: FLEXIBLE SIGMOIDOSCOPY;  Surgeon: Ladene Artist, MD;  Location: [facility] ENDOSCOPY;  Service: Endoscopy;  Laterality: N/A;   Family History  Problem Relation Age of Onset  . Colon cancer Sister 77  . Stroke Other     GP  . Diabetes Neg Hx   . Thyroid disease Neg Hx     no goiter or other thyroid problem  . Stomach cancer Neg Hx   . Prostate cancer Neg Hx    Social History  Substance Use Topics  . Smoking status: Former Smoker    Types: Cigarettes  . Smokeless tobacco: Never Used     Comment: 1-2 cigarettes per  day  . Alcohol use Yes     Comment: very rare    Review of Systems  Constitutional: Positive for fatigue. Negative for chills and fever.  HENT: Positive for rhinorrhea. Negative for congestion, sinus pain, sinus pressure and sore throat.   Respiratory: Positive for cough and shortness of breath. Negative for wheezing.   Cardiovascular: Negative.   Gastrointestinal: Negative.   Musculoskeletal: Negative.   Neurological: Negative.     Allergies  Patient has no known allergies.  Home Medications   Prior to Admission medications   Medication Sig Start Date End Date Taking? Authorizing Provider  aspirin 81 MG tablet Take 81 mg by mouth daily.      Historical Provider, MD  azithromycin (ZITHROMAX) 250 MG tablet Take 1 tablet (250 mg total) by mouth daily. Take first 2 tablets together, then 1 every day until finished. 04/07/16   Barnet Glasgow, NP  benzonatate (TESSALON) 100 MG capsule Take 1 capsule (100 mg total) by mouth every 8 (eight) hours. 04/07/16   Barnet Glasgow, NP  carboxymethylcellulose (REFRESH PLUS) 0.5 % SOLN Place 1 drop into both eyes 4 (four) times daily - after meals and at bedtime.    Historical Provider, MD  cyanocobalamin 1000 MCG tablet Take 100 mcg by mouth daily.      Historical Provider, MD  dorzolamide-timolol (COSOPT) 22.3-6.8 MG/ML ophthalmic solution Place 1 drop  into both eyes every 12 (twelve) hours.  10/14/11   Historical Provider, MD  fish oil-omega-3 fatty acids 1000 MG capsule Take 2 g by mouth daily.      Historical Provider, MD  Multiple Vitamin (MULTIVITAMIN) tablet Take 1 tablet by mouth daily.      Historical Provider, MD  predniSONE (STERAPRED UNI-PAK 21 TAB) 10 MG (21) TBPK tablet Take 6 tablets today then decrease by 1 day till finished [id number]) 04/07/16   Barnet Glasgow, NP   Meds Ordered and Administered this Visit  Medications - No data to display  BP 136/72   Pulse 77   Temp 98.6 F (37 C)   Resp 18   SpO2 100%  No data  found.   Physical Exam  Constitutional: He is oriented to person, place, and time. He appears well-developed and well-nourished. No distress.  HENT:  Head: Normocephalic.  Right Ear: External ear normal.  Left Ear: External ear normal.  Neck: No JVD present.  Cardiovascular: Normal rate and regular rhythm.   Pulmonary/Chest: Effort normal. No tachypnea and no bradypnea. No respiratory distress. He has no decreased breath sounds. He has no wheezes. He has rhonchi in the left upper field.  Abdominal: Soft. Bowel sounds are normal.  Neurological: He is alert and oriented to person, place, and time.  Skin: Skin is warm and dry. Capillary refill takes less than 2 seconds. He is not diaphoretic.  Nursing note and vitals reviewed.   Urgent Care Course   Clinical Course     Procedures (including critical care time)  Labs Review Labs Reviewed - No data to display  Imaging Review Dg Chest 2 View  Result Date: 04/07/2016 CLINICAL DATA:  83 y/o M; 2-3 weeks of cough, sinus pain, and congestion. EXAM: CHEST  2 VIEW COMPARISON:  03/07/2015 chest radiograph FINDINGS: The heart size and mediastinal contours are within normal limits and stable. Both lungs are clear. Mild degenerative changes of the thoracic spine. Aortic atherosclerosis with calcification. IMPRESSION: No active cardiopulmonary disease. Electronically Signed   By: Kristine Garbe M.D.   On: 04/07/2016 18:09     Visual Acuity Review  Right Eye Distance:   Left Eye Distance:   Bilateral Distance:    Right Eye Near:   Left Eye Near:    Bilateral Near:         MDM   1. Bronchitis    Your chest Xray was negative for any acute findings such as pneumonia. You are being treated for bronchitis. I have prescribed Azithromycin, take 2 tablets today then 1 tablet daily for the remaining prescription. I have prescribed Tessalon for cough, take 3 times a day. I have also prescribed a prednisone dose pack. Take 6 tablets  in the morning then decrease by 1 each day till finished. Take Mucinex over the counter twice a day with a full glass of water. Should your symptoms fail to improve or worsen follow up with your primary care provider or return to clinic.    Barnet Glasgow, NP 04/07/16 1824

## 2016-04-23 ENCOUNTER — Telehealth: Payer: Self-pay | Admitting: Internal Medicine

## 2016-04-23 NOTE — Telephone Encounter (Signed)
Pt's wife called concerned because she thinks Tommy Browning has dementia, she wanted to let you know before his appt on 04/30/16 so you can be aware. She said he may need some testing done, please advise.  Also wife asked if we could not mention to him about having dementia

## 2016-04-28 ENCOUNTER — Inpatient Hospital Stay: Payer: Medicare Other | Admitting: Internal Medicine

## 2016-04-30 ENCOUNTER — Other Ambulatory Visit (INDEPENDENT_AMBULATORY_CARE_PROVIDER_SITE_OTHER): Payer: Medicare Other

## 2016-04-30 ENCOUNTER — Encounter: Payer: Self-pay | Admitting: Internal Medicine

## 2016-04-30 ENCOUNTER — Ambulatory Visit (INDEPENDENT_AMBULATORY_CARE_PROVIDER_SITE_OTHER): Payer: Medicare Other | Admitting: Internal Medicine

## 2016-04-30 VITALS — BP 120/90 | HR 74 | Temp 97.9°F | Resp 14 | Ht 70.0 in | Wt 148.8 lb

## 2016-04-30 DIAGNOSIS — R413 Other amnesia: Secondary | ICD-10-CM | POA: Diagnosis not present

## 2016-04-30 DIAGNOSIS — J40 Bronchitis, not specified as acute or chronic: Secondary | ICD-10-CM

## 2016-04-30 DIAGNOSIS — R4589 Other symptoms and signs involving emotional state: Secondary | ICD-10-CM | POA: Diagnosis not present

## 2016-04-30 LAB — TSH: TSH: 0.78 u[IU]/mL (ref 0.35–4.50)

## 2016-04-30 LAB — CBC
HCT: 42.4 % (ref 39.0–52.0)
HEMOGLOBIN: 14.4 g/dL (ref 13.0–17.0)
MCHC: 34.1 g/dL (ref 30.0–36.0)
MCV: 89.8 fl (ref 78.0–100.0)
PLATELETS: 224 10*3/uL (ref 150.0–400.0)
RBC: 4.72 Mil/uL (ref 4.22–5.81)
RDW: 13.7 % (ref 11.5–15.5)
WBC: 3.2 10*3/uL — ABNORMAL LOW (ref 4.0–10.5)

## 2016-04-30 LAB — T4, FREE: Free T4: 0.82 ng/dL (ref 0.60–1.60)

## 2016-04-30 NOTE — Progress Notes (Signed)
   Subjective:    Patient ID: Tommy Browning., male    DOB: 04-11-1929, 83 y.o.   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   Review of Systems  Constitutional: Positive for activity change and fatigue. Negative for appetite change, chills, fever and unexpected weight change.  HENT: Negative.   Eyes: Negative.   Respiratory: Positive for cough. Negative for chest tightness, shortness of breath and wheezing.   Cardiovascular: Negative.   Gastrointestinal: Negative.   Skin: Negative.   Neurological:       Memory change  Psychiatric/Behavioral: Positive for decreased concentration and dysphoric mood. Negative for agitation, confusion, self-injury, sleep disturbance and suicidal ideas. The patient is not nervous/anxious.       Objective:   Physical Exam  Constitutional: He is oriented to person, place, and time. He appears well-developed and well-nourished.  HENT:  Head: Normocephalic and atraumatic.  Eyes: EOM are normal.  Neck: Normal range of motion.  Cardiovascular: Normal rate and regular rhythm.   Pulmonary/Chest: Effort normal and breath sounds normal.  Abdominal: Soft. He exhibits no distension. There is no tenderness. There is no rebound.  Musculoskeletal: He exhibits no  edema.  Neurological: He is alert and oriented to person, place, and time.  Able to answer short term memory questions with time  Skin: Skin is warm and dry.  Psychiatric:  Some flat affect   Vitals:   04/30/16 1409  BP: 120/90  Pulse: 74  Resp: 14  Temp: 97.9 F (36.6 C)  TempSrc: Oral  SpO2: 97%  Weight: 148 lb 12 oz (67.5 kg)  Height: 5\' 10"  (1.778 m)      Assessment & Plan:

## 2016-04-30 NOTE — Progress Notes (Signed)
Pre visit review using our clinic review tool, if applicable. No additional management support is needed unless otherwise documented below in the visit note. 

## 2016-04-30 NOTE — Patient Instructions (Addendum)
We will send you to the memory doctor to see if they can find out the difference.  I think it would be a great idea to find someone to talk to if you can whether it is someone in the church or a therapist or your wife.

## 2016-05-01 DIAGNOSIS — J40 Bronchitis, not specified as acute or chronic: Secondary | ICD-10-CM | POA: Insufficient documentation

## 2016-05-01 DIAGNOSIS — R4589 Other symptoms and signs involving emotional state: Secondary | ICD-10-CM | POA: Insufficient documentation

## 2016-05-01 DIAGNOSIS — R413 Other amnesia: Secondary | ICD-10-CM | POA: Insufficient documentation

## 2016-05-01 LAB — VITAMIN B12: Vitamin B-12: 1031 pg/mL — ABNORMAL HIGH (ref 211–911)

## 2016-05-01 NOTE — Assessment & Plan Note (Signed)
Resolved with prednisone and antibiotics. No adverse reaction with the medications. Encouraged him to start some mild exercise to build back his stamina and encouraged him that this could take some time since he was sick for several weeks.

## 2016-05-01 NOTE — Assessment & Plan Note (Signed)
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 He is struggling with having to give up some responsibilities in the church as physically he is not able to keep up with it.

## 2016-05-01 NOTE — Assessment & Plan Note (Signed)
Checking thyroid, CBC, B12 levels today. Suspect that some of this is coming from some depression. Have strongly encouraged him to talk to a therapist or someone in the church or his wife to let some of his stress out. They wish referral to neurology for full exam and placed today.

## 2016-05-28 ENCOUNTER — Ambulatory Visit (INDEPENDENT_AMBULATORY_CARE_PROVIDER_SITE_OTHER): Payer: Medicare Other | Admitting: Neurology

## 2016-05-28 ENCOUNTER — Encounter: Payer: Self-pay | Admitting: Neurology

## 2016-05-28 VITALS — BP 120/82 | HR 72 | Ht 70.0 in | Wt 151.5 lb

## 2016-05-28 DIAGNOSIS — R413 Other amnesia: Secondary | ICD-10-CM

## 2016-05-28 MED ORDER — DONEPEZIL HCL 5 MG PO TABS: 5.0000 mg | ORAL_TABLET | Freq: Every day | ORAL | 1 refills | Status: DC

## 2016-05-28 NOTE — Patient Instructions (Signed)
   We will get MRI of the brain and start Aricept for memory.  Begin Aricept (donepezil) at 5 mg at night for one month. If this medication is well-tolerated, please call our office and we will call in a prescription for the 10 mg tablets. Look out for side effects that may include nausea, diarrhea, weight loss, or stomach cramps. This medication will also cause a runny nose, therefore there is no need for allergy medications for this purpose.

## 2016-05-28 NOTE — Progress Notes (Signed)
Reason for visit: Memory disorder  Referring physician: Dr. Lavonne Chick. is a 83 y.o. male  History of present illness:  Tommy Browning is an 83 year old right-handed black male with a history of a memory disturbance that has been present for about one year, gradually progressing. The patient comes in with his wife who helps out with the medical history. The patient has had some difficulty with remembering names for people and things. He may repeat himself at times. The patient does have some short-term memory issues. He may misplace things about the house, and have some difficulty keeping up with appointments. The patient does not have many medications, he is able to keep track of this fairly well. The patient does operate a motor vehicle, occasionally he may have some issues with directions. The safety with driving has not been affected. The patient occasionally will have difficulty sleeping at night, he gets up 2 or 3 times to use the bathroom. He may have some fatigue during the day. The patient denies any numbness or weakness of the face, arms, or legs. He does note some mild balance issues. He has started an over-the-counter medication, Previgen for memory. The patient is sent to this office for further evaluation. Recent blood work shows a normal TSH and vitamin B12 level.  Past Medical History:  Diagnosis Date  . BACK PAIN 02/24/2008  . Diplopia   . Diverticulosis   . ELECTROCARDIOGRAM, ABNORMAL 12/08/2006   F/U by a normal stress test on September 2005  . ERECTILE DYSFUNCTION, ORGANIC 05/30/2009  . History of thyroid nodule    Resolved by [place] 11/2006  . [place], MILD 05/30/2009  . HYPERTHYROIDISM 12/08/2006   [place]  . HYPERTROPHY PROSTATE W/UR OBST & OTH LUTS 02/26/2009  . HYPOGONADISM 02/03/2010  . Ischemic colitis ([place])   . Tubular adenoma of colon 2004    Past Surgical History:  Procedure Laterality Date  . COLONOSCOPY    . FLEXIBLE SIGMOIDOSCOPY N/A 03/08/2015    Procedure: FLEXIBLE SIGMOIDOSCOPY;  Surgeon: Ladene Artist, MD;  Location[facility] ENDOSCOPY;  Service: Endoscopy;  Laterality: N/A;    Family History  Problem Relation Age of Onset  . Colon cancer Sister 47  . Stroke Other     GP  . Diabetes Neg Hx   . Thyroid disease Neg Hx     no goiter or other thyroid problem  . Stomach cancer Neg Hx   . Prostate cancer Neg Hx     Social history:  reports that he has quit smoking. His smoking use included Cigarettes. He has never used smokeless tobacco. He reports that he drinks alcohol. He reports that he does not use drugs.  Medications:  Prior to Admission medications   Medication Sig Start Date End Date Taking? Authorizing Provider  aspirin 81 MG tablet Take 81 mg by mouth daily.     Yes Historical Provider, MD  carboxymethylcellulose (REFRESH PLUS) 0.5 % SOLN Place 1 drop into both eyes 4 (four) times daily - after meals and at bedtime.   Yes Historical Provider, MD  cyanocobalamin 1000 MCG tablet Take 100 mcg by mouth daily.     Yes Historical Provider, MD  dorzolamide-timolol (COSOPT) 22.3-6.8 MG/ML ophthalmic solution Place 1 drop into both eyes every 12 (twelve) hours.  10/14/11  Yes Historical Provider, MD  fish oil-omega-3 fatty acids 1000 MG capsule Take 2 g by mouth daily.     Yes Historical Provider, MD  Multiple Vitamin (MULTIVITAMIN) tablet Take  1 tablet by mouth daily.     Yes Historical Provider, MD     No Known Allergies  ROS:  Out of a complete 14 system review of symptoms, the patient complains only of the following symptoms, and all other reviewed systems are negative.  Weight loss, fatigue Double vision Ringing in years Urination problems, impotence Confusion, dizziness, change in appetite, disinterest in activities  Blood pressure 120/82, pulse 72, height 5\' 10"  (1.778 m), weight 151 lb 8 oz (68.7 kg).  Physical Exam  General: The patient is alert and cooperative at the time of the examination.  Eyes: Pupils  are equal, round, and reactive to light. Discs are flat bilaterally.  Neck: The neck is supple, no carotid bruits are noted.  Respiratory: The respiratory examination is clear.  Cardiovascular: The cardiovascular examination reveals a regular rate and rhythm, no obvious murmurs or rubs are noted.  Skin: Extremities are without significant edema.  Neurologic Exam  Mental status: The patient is alert and oriented x 3 at the time of the examination. The patient has apparent normal recent and remote memory, with an apparently normal attention span and concentration ability. Mini-Mental Status Examination done today shows a total score of 27/30.  Cranial nerves: Facial symmetry is present. There is good sensation of the face to pinprick and soft touch bilaterally. The strength of the facial muscles and the muscles to head turning and shoulder shrug are normal bilaterally. Speech is well enunciated, no aphasia or dysarthria is noted. Extraocular movements are full. On primary gaze, there is some slight nasal deviation of the left eye. Visual fields are full. The tongue is midline, and the patient has symmetric elevation of the soft palate. No obvious hearing deficits are noted.  Motor: The motor testing reveals 5 over 5 strength of all 4 extremities. Good symmetric motor tone is noted throughout.  Sensory: Sensory testing is intact to pinprick, soft touch, vibration sensation, and position sense on all 4 extremities. No evidence of extinction is noted.  Coordination: Cerebellar testing reveals good finger-nose-finger and heel-to-shin bilaterally.  Gait and station: Gait is normal. Tandem gait is slightly unsteady. Romberg is negative. No drift is seen.  Reflexes: Deep tendon reflexes are symmetric and normal bilaterally. Toes are downgoing bilaterally.    MRI brain 08/13/10:  IMPRESSION:  1.  No acute or focal abnormality to explain the patient's diplopia. 2.  Moderate atrophy and  extensive white matter disease.  This is nonspecific, but likely reflects the sequelae of chronic microvascular ischemia.  * MRI scan images were reviewed online. I agree with the written report.    Assessment/Plan:  1. Memory disorder  2. Small vessel disease by MRI brain  The patient has had some change in memory over the last one year. The patient had MRI evaluation of the brain for double vision in May 2012 which showed a moderate level of small vessel ischemic changes. The patient will undergo a repeat MRI of the brain to reevaluate this issue and look for extension of these small vessel changes. The patient will be placed on low-dose Aricept, we will need to watch the weight over time. The patient will follow-up in about 6 months, sooner if needed.  Tommy Alexanders MD 05/28/2016 2:25 PM  [facility] [address] [place], [place] [address]  Phone [phone] Fax [phone]

## 2016-06-05 ENCOUNTER — Other Ambulatory Visit: Payer: Medicare Other

## 2016-06-18 ENCOUNTER — Ambulatory Visit
Admission: RE | Admit: 2016-06-18 | Discharge: 2016-06-18 | Disposition: A | Discharge status: Home / Self Care | Payer: Medicare Other | Source: Ambulatory Visit | Attending: Neurology | Admitting: Neurology

## 2016-06-18 DIAGNOSIS — R413 Other amnesia: Secondary | ICD-10-CM

## 2016-06-19 ENCOUNTER — Telehealth: Payer: Self-pay | Admitting: Neurology

## 2016-06-19 NOTE — Telephone Encounter (Signed)
I called the patient. The MRI the brain shows extensive white matter changes including the periventricular areas as well as the brainstem. This likely is at least in part a source of his memory problems and some balance problems. This has progressed some since 2012. He is to remain on aspirin, his blood pressure was relatively well controlled when we saw him in the office.  MRI brain 06/19/16:  IMPRESSION:  This MRI of the brain without contrast shows the following: 1.    Moderate cortical atrophy that has progressed when compared to the 08/12/2010 MRI. 2.    Chronic microvascular ischemic change involving the pons and hemispheres that has progressed when compared to the 08/12/2010 MRI. 3.    Acute and chronic maxillary sinusitis and mild chronic ethmoid sinusitis. 4.    There are no acute findings of the brain.

## 2016-08-22 ENCOUNTER — Other Ambulatory Visit: Payer: Self-pay | Admitting: Neurology

## 2016-08-25 MED ORDER — DONEPEZIL HCL 10 MG PO TABS: 10.0000 mg | ORAL_TABLET | Freq: Every day | ORAL | 3 refills | Status: DC

## 2016-08-25 NOTE — Telephone Encounter (Signed)
Called pt. Spoke with pt/wife. He is doing well on aricept 5mg  tablet. No SE. Educated pt/wife about common SE again to look out for on higher dose. Will send in new rx for 10mg  tablet. They verbalized understanding. They will call if any further questions/concerns.

## 2016-10-07 ENCOUNTER — Other Ambulatory Visit: Payer: Self-pay | Admitting: Neurology

## 2016-12-01 ENCOUNTER — Encounter: Payer: Self-pay | Admitting: Adult Health

## 2016-12-01 ENCOUNTER — Ambulatory Visit (INDEPENDENT_AMBULATORY_CARE_PROVIDER_SITE_OTHER): Payer: Medicare Other | Admitting: Adult Health

## 2016-12-01 VITALS — BP 126/88 | HR 62 | Wt 155.2 lb

## 2016-12-01 DIAGNOSIS — R413 Other amnesia: Secondary | ICD-10-CM

## 2016-12-01 NOTE — Progress Notes (Signed)
I have read the note, and I agree with the clinical assessment and plan.  Tommy Browning   

## 2016-12-01 NOTE — Progress Notes (Signed)
PATIENT: Tommy Browning. DOB: 05/15/1929  REASON FOR VISIT: follow up HISTORY FROM: patient  HISTORY OF PRESENT ILLNESS: Today 12/01/16  Tommy Browning is an 83 year old male with a history of memory disturbance. He returns today for follow-up. He is currently on Aricept 10 mg daily and tolerating it well. His wife feels that his memory may be slightly better although the patient feels that has remained the same. He lives at home with his wife. He is able to complete all ADLs independently. He continues to manage his own finances without difficulty. Denies any trouble sleeping. Denies any changes in his appetite. His wife notes that when he originally started Aricept she noticed that he was easily agitated however that has improved. 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 He does not feel that it was due to his memory although his wife feels that it was. He returns today for an evaluation.  HISTORY 05/28/16: Tommy Browning is an 83 year old right-handed black male with a history of a memory disturbance that has been present for about one year, gradually progressing. The patient comes in with his wife who helps out with the medical history. The patient has had some difficulty with remembering names for people and things. He may repeat himself at times. The patient does have some short-term memory issues. He may misplace things about the house, and have some difficulty keeping up with appointments. The patient does not have many medications, he is able to keep track of this fairly well. The patient does operate a motor vehicle, occasionally he may have some issues with directions. The safety with driving has not been affected. The patient occasionally will have difficulty sleeping at night, he gets up 2 or 3 times to use the bathroom. He may have some fatigue during the day. The patient denies any numbness or weakness of the face, arms, or legs. He does note some  mild balance issues. He has started an over-the-counter medication, Previgen for memory. The patient is sent to this office for further evaluation. Recent blood work shows a normal TSH and vitamin B12 level.  REVIEW OF SYSTEMS: Out of a complete 14 system review of symptoms, the patient complains only of the following symptoms, and all other reviewed systems are negative.  Agitation, depression, frequency of urination, black stools, double vision, eye itching, runny nose  ALLERGIES: No Known Allergies  HOME MEDICATIONS: Outpatient Medications Prior to Visit  Medication Sig Dispense Refill  . aspirin 81 MG tablet Take 81 mg by mouth daily.      . carboxymethylcellulose (REFRESH PLUS) 0.5 % SOLN Place 1 drop into both eyes 4 (four) times daily - after meals and at bedtime.    . dorzolamide-timolol (COSOPT) 22.3-6.8 MG/ML ophthalmic solution Place 1 drop into both eyes every 12 (twelve) hours.     . fish oil-omega-3 fatty acids 1000 MG capsule Take 2 g by mouth daily.      . Multiple Vitamin (MULTIVITAMIN) tablet Take 1 tablet by mouth daily.      . cyanocobalamin 1000 MCG tablet Take 100 mcg by mouth daily.      Marland Kitchen donepezil (ARICEPT) 10 MG tablet Take 1 tablet (10 mg total) by mouth at bedtime. 30 tablet 3  . donepezil (ARICEPT) 5 MG tablet TAKE 1 TABLET (5 MG TOTAL) BY MOUTH AT BEDTIME. 30 tablet 3   No facility-administered medications prior to visit.     PAST MEDICAL  HISTORY: Past Medical History:  Diagnosis Date  . BACK PAIN 02/24/2008  . Diplopia   . Diverticulosis   . ELECTROCARDIOGRAM, ABNORMAL 12/08/2006   F/U by a normal stress test on September 2005  . ERECTILE DYSFUNCTION, ORGANIC 05/30/2009  . History of thyroid nodule    Resolved by [place] 11/2006  . [place], MILD 05/30/2009  . HYPERTHYROIDISM 12/08/2006   [place]  . HYPERTROPHY PROSTATE W/UR OBST & OTH LUTS 02/26/2009  . HYPOGONADISM 02/03/2010  . Ischemic colitis ([place])   . Tubular adenoma of colon 2004    PAST  SURGICAL HISTORY: Past Surgical History:  Procedure Laterality Date  . COLONOSCOPY    . FLEXIBLE SIGMOIDOSCOPY N/A 03/08/2015   Procedure: FLEXIBLE SIGMOIDOSCOPY;  Surgeon: Ladene Artist, MD;  Location: [facility] ENDOSCOPY;  Service: Endoscopy;  Laterality: N/A;    FAMILY HISTORY: Family History  Problem Relation Age of Onset  . Colon cancer Sister 9  . Stroke Other        GP  . Diabetes Neg Hx   . Thyroid disease Neg Hx        no goiter or other thyroid problem  . Stomach cancer Neg Hx   . Prostate cancer Neg Hx     SOCIAL HISTORY: Social History   Social History  . Marital status: Married    Spouse name: N/A  . Number of children: 2  . Years of education: N/A   Occupational History  . 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 Smoking status: Former Smoker    Types: Cigarettes  . Smokeless tobacco: Never Used     Comment: 1-2 cigarettes per day  . Alcohol use Yes     Comment: very rare  . Drug use: No  . Sexual activity: Not on file     Comment: casual smoker   Other Topics Concern  . 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   MMSE - Mini Mental State Exam 12/01/2016 05/28/2016  Orientation to time 4 5  Orientation to Place 5 4  Registration 3 3  Attention/ Calculation 1 5  Recall 2 1  Language- name 2 objects 2 2  Language- repeat 1 1  Language- follow 3 step command 3 3  Language- read & follow direction 1 1  Write a sentence 1 1  Copy design 1 1  Total score 24 27    Generalized: Well developed, in no acute distress   Neurological examination  Mentation: Alert oriented to time, place, history  taking. Follows all commands speech and language fluent Cranial nerve II-XII: Pupils were equal round reactive to light. Extraocular movements were full, visual field were full on confrontational test. Facial sensation and strength were normal. Uvula tongue midline. Head turning and shoulder shrug  were normal and symmetric. Motor: The motor testing reveals 5 over 5 strength of all 4 extremities. Good symmetric motor tone is noted throughout.  Sensory: Sensory testing is intact to soft touch on all 4 extremities.  No evidence of extinction is noted.  Coordination: Cerebellar testing reveals good finger-nose-finger and heel-to-shin bilaterally.  Gait and station: Gait is normal. Tandem gait is normal. Romberg is negative. No drift is seen.  Reflexes: Deep tendon reflexes are symmetric and normal bilaterally.   DIAGNOSTIC DATA (LABS, IMAGING, TESTING) - I reviewed patient records, labs, notes, testing and imaging myself where available.  Lab Results  Component Value Date   WBC 3.2 (L) 04/30/2016   HGB 14.4 04/30/2016   HCT 42.4 04/30/2016   MCV 89.8 04/30/2016   PLT 224.0 04/30/2016      Component Value Date/Time   NA 140 01/01/2016 1013   K 3.9 01/01/2016 1013   CL 104 01/01/2016 1013   CO2 31 01/01/2016 1013   GLUCOSE 89 01/01/2016 1013   BUN 14 01/01/2016 1013   CREATININE 1.01 01/01/2016 1013   CALCIUM 9.2 01/01/2016 1013   PROT 7.3 01/01/2016 1013   ALBUMIN 4.0 01/01/2016 1013   AST 25 01/01/2016 1013   ALT 16 01/01/2016 1013   ALKPHOS 58 01/01/2016 1013   BILITOT 0.6 01/01/2016 1013   GFRNONAA >60 03/09/2015 0311   GFRAA >60 03/09/2015 0311   Lab Results  Component Value Date   CHOL 227 (H) 01/01/2016   HDL 52.70 01/01/2016   LDLCALC 148 (H) 01/01/2016   LDLDIRECT 182.8 05/19/2013   TRIG 133.0 01/01/2016   CHOLHDL 4 01/01/2016   No results found for: HGBA1C Lab Results  Component Value Date   VITAMINB12 1,031 (H) 04/30/2016   Lab Results  Component Value Date     TSH 0.78 04/30/2016      ASSESSMENT AND PLAN 83 y.o. year old male  has a past medical history of BACK PAIN (02/24/2008); Diplopia; Diverticulosis; ELECTROCARDIOGRAM, ABNORMAL (12/08/2006); ERECTILE DYSFUNCTION, ORGANIC (05/30/2009); History of thyroid nodule; HYPERLIPIDEMIA, MILD (05/30/2009); HYPERTHYROIDISM (12/08/2006); HYPERTROPHY PROSTATE W/UR OBST & OTH LUTS (02/26/2009); HYPOGONADISM (02/03/2010); Ischemic colitis ([place]); and Tubular adenoma of colon (2004). here with :  1. Memory disturbance  The patient's memory score has declined slightly. He will remain on Aricept 10 mg daily. I suggested to the patient that we could consider starting Namenda. I reviewed Namenda with the patient and his wife. At this time they want to think about it. They will call back if they want the medication prescribed. I provided him with a handout reviewing the medication. Advised that if his symptoms worsen or he develops new symptoms he should let us know. He will follow-up in 6 months or sooner if needed.   Ward Givens, MSN, NP-C 12/01/2016, 1:23 PM [facility] [address] [place], [place] [address] [phone]

## 2016-12-01 NOTE — Patient Instructions (Addendum)
Your Plan:  Continue aricept 10 mg daily at bedtime Consider Namenda  Thank you for coming to see [place] at [facility]. I hope we have been able to provide you high quality care today.  You may receive a patient satisfaction survey over the next few weeks. We would appreciate your feedback and comments so that we may continue to improve ourselves and the health of our patients.  Memantine Tablets What is this medicine? MEMANTINE (MEM an teen) is used to treat dementia caused by Alzheimer's disease. This medicine may be used for other purposes; ask your health care provider or pharmacist if you have questions. COMMON BRAND NAME(S): Namenda What should I tell my health care provider before I take this medicine? They need to know if you have any of these conditions: -difficulty passing urine -kidney disease -liver disease -seizures -an unusual or allergic reaction to memantine, other medicines, foods, dyes, or preservatives -pregnant or trying to get pregnant -breast-feeding How should I use this medicine? Take this medicine by mouth with a glass of water. Follow the directions on the prescription label. You may take this medicine with or without food. Take your doses at regular intervals. Do not take your medicine more often than directed. Continue to take your medicine even if you feel better. Do not stop taking except on the advice of your doctor or health care professional. Talk to your pediatrician regarding the use of this medicine in children. Special care may be needed. Overdosage: If you think you have taken too much of this medicine contact a poison control center or emergency room at once. NOTE: This medicine is only for you. Do not share this medicine with others. What if I miss a dose? If you miss a dose, take it as soon as you can. If it is almost time for your next dose, take only that dose. Do not take double or extra doses. If you do not take your medicine  for several days, contact your health care provider. Your dose may need to be changed. What may interact with this medicine? -acetazolamide -amantadine -cimetidine -dextromethorphan -dofetilide -hydrochlorothiazide -ketamine -metformin -methazolamide -quinidine -ranitidine -sodium bicarbonate -triamterene This list may not describe all possible interactions. Give your health care provider a list of all the medicines, herbs, non-prescription drugs, or dietary supplements you use. Also tell them if you smoke, drink alcohol, or use illegal drugs. Some items may interact with your medicine. What should I watch for while using this medicine? Visit your doctor or health care professional for regular checks on your progress. Check with your doctor or health care professional if there is no improvement in your symptoms or if they get worse. You may get drowsy or dizzy. Do not drive, use machinery, or do anything that needs mental alertness until you know how this drug affects you. Do not stand or sit up quickly, especially if you are an older patient. This reduces the risk of dizzy or fainting spells. Alcohol can make you more drowsy and dizzy. Avoid alcoholic drinks. What side effects may I notice from receiving this medicine? Side effects that you should report to your doctor or health care professional as soon as possible: -allergic reactions like skin rash, itching or hives, swelling of the face, lips, or tongue -agitation or a feeling of restlessness -depressed mood -dizziness -hallucinations -redness, blistering, peeling or loosening of the skin, including inside the mouth -seizures -vomiting Side effects that usually do not require medical attention (report to your doctor  or health care professional if they continue or are bothersome): -constipation -diarrhea -headache -nausea -trouble sleeping This list may not describe all possible side effects. Call your doctor for medical advice  about side effects. You may report side effects to FDA at 1-800-FDA-1088. Where should I keep my medicine? Keep out of the reach of children. Store at room temperature between 15 degrees and 30 degrees C (59 degrees and 86 degrees F). Throw away any unused medicine after the expiration date. NOTE: This sheet is a summary. It may not cover all possible information. If you have questions about this medicine, talk to your doctor, pharmacist, or health care provider.  2018 Elsevier/Gold Standard (2013-01-02 14:10:42)

## 2016-12-02 ENCOUNTER — Encounter (HOSPITAL_COMMUNITY): Payer: Self-pay | Admitting: Emergency Medicine

## 2016-12-02 ENCOUNTER — Ambulatory Visit (HOSPITAL_COMMUNITY)
Admission: EM | Admit: 2016-12-02 | Discharge: 2016-12-02 | Disposition: A | Discharge status: Home / Self Care | Payer: Medicare Other | Attending: Family Medicine | Admitting: Family Medicine

## 2016-12-02 DIAGNOSIS — R0981 Nasal congestion: Secondary | ICD-10-CM

## 2016-12-02 DIAGNOSIS — H6123 Impacted cerumen, bilateral: Secondary | ICD-10-CM | POA: Diagnosis not present

## 2016-12-02 MED ORDER — FLUTICASONE PROPIONATE 50 MCG/ACT NA SUSP: 2.0000 | Freq: Every day | NASAL | 0 refills | Status: DC

## 2016-12-02 NOTE — Discharge Instructions (Signed)
We were able to clean out your left ear, right ear still with ear wax. Use over the counter ear wax softener in your right ear, can follow up here or with PCP for removal. Start flonase for nasal congestion. Confirm with your ophthalmologist if you can use allergy medicine. You can use over the counter nasal saline rinse such as neti pot for nasal congestion. Keep hydrated, your urine should be clear to pale yellow in color. Monitor for any worsening of symptoms, chest pain, shortness of breath, wheezing, swelling of the throat, follow up for reevaluation.

## 2016-12-02 NOTE — ED Triage Notes (Signed)
The patient presented to the [facility] with a complaint of a "head cold" and chest congestion x 1 week.

## 2016-12-02 NOTE — ED Provider Notes (Signed)
[facility]    CSN: [id number] Arrival date & time: 12/02/16  1720     History   Chief Complaint Chief Complaint  Patient presents with  . Facial Pain    HPI Tommy Browning. is a 83 y.o. male.   83 year old male with history of back pain, memory disorder, Graves' disease comes in for one-week history of "head cold". He states he has been feeling pressure/tightness around the front of his face, which is worse in the morning. Patient thinks that it may be due to him sleeping in front of the TV and having the air conditioner blow on his head. He has rhinorrhea, sneezing and occasional cough. Does feel some ear pressure. Denies chest pain, shortness of breath, wheezing. Denies fever, night sweats. Has had occasional chills. Denies weakness, dizziness. Has not taken anything for the symptoms.       Past Medical History:  Diagnosis Date  . BACK PAIN 02/24/2008  . Diplopia   . Diverticulosis   . ELECTROCARDIOGRAM, ABNORMAL 12/08/2006   F/U by a normal stress test on September 2005  . ERECTILE DYSFUNCTION, ORGANIC 05/30/2009  . History of thyroid nodule    Resolved by [place] 11/2006  . [place], MILD 05/30/2009  . HYPERTHYROIDISM 12/08/2006   [place]  . HYPERTROPHY PROSTATE W/UR OBST & OTH LUTS 02/26/2009  . HYPOGONADISM 02/03/2010  . Ischemic colitis ([place])   . Tubular adenoma of colon 2004    Patient Active Problem List   Diagnosis Date Noted  . Memory disorder 05/01/2016  . Bronchitis 05/01/2016  . Dysphoric mood 05/01/2016  . Loss of weight 07/23/2015  . Graves' eye disease 04/16/2011  . Routine general medical examination at a health care facility 01/27/2011  . HYPOGONADISM 02/03/2010  . Borderline high cholesterol 05/30/2009  . BPH (benign prostatic hyperplasia) 02/26/2009  . ELECTROCARDIOGRAM, ABNORMAL 12/08/2006    Past Surgical History:  Procedure Laterality Date  . COLONOSCOPY    . FLEXIBLE SIGMOIDOSCOPY N/A 03/08/2015   Procedure: FLEXIBLE  SIGMOIDOSCOPY;  Surgeon: Ladene Artist, MD;  Location: [facility] ENDOSCOPY;  Service: Endoscopy;  Laterality: N/A;       Home Medications    Prior to Admission medications   Medication Sig Start Date End Date Taking? Authorizing Provider  aspirin 81 MG tablet Take 81 mg by mouth daily.      [provider]  carboxymethylcellulose (REFRESH PLUS) 0.5 % SOLN Place 1 drop into both eyes 4 (four) times daily - after meals and at bedtime.    [provider]  cyanocobalamin 1000 MCG tablet Take 100 mcg by mouth daily.      [provider]  donepezil (ARICEPT) 5 MG tablet TAKE 1 TABLET (5 MG TOTAL) BY MOUTH AT BEDTIME. 10/08/16   Kathrynn Ducking, MD  dorzolamide-timolol (COSOPT) 22.3-6.8 MG/ML ophthalmic solution Place 1 drop into both eyes every 12 (twelve) hours.  10/14/11   [provider]  fish oil-omega-3 fatty acids 1000 MG capsule Take 2 g by mouth daily.      [provider]  fluticasone (FLONASE) 50 MCG/ACT nasal spray Place 2 sprays into both nostrils daily. 12/02/16   Tasia Catchings, Amy V, PA-C  Multiple Vitamin (MULTIVITAMIN) tablet Take 1 tablet by mouth daily.      [provider]    Family History Family History  Problem Relation Age of Onset  . Colon cancer Sister 69  . Stroke Other        GP  . Diabetes Neg Hx   .  Thyroid disease Neg Hx        no goiter or other thyroid problem  . Stomach cancer Neg Hx   . Prostate cancer Neg Hx     Social History Social History  Substance Use Topics  . Smoking status: Former Smoker    Types: Cigarettes  . Smokeless tobacco: Never Used     Comment: 1-2 cigarettes per day  . Alcohol use Yes     Comment: very rare     Allergies   Patient has no known allergies.   Review of Systems Review of Systems  Reason unable to perform ROS: See HPI as above.     Physical Exam Triage Vital Signs ED Triage Vitals  Enc Vitals Group     BP 12/02/16 1732 118/83     Pulse Rate 12/02/16 1732 82      Resp 12/02/16 1732 18     Temp 12/02/16 1732 98.4 F (36.9 C)     Temp Source 12/02/16 1732 Oral     SpO2 12/02/16 1732 97 %     Weight --      Height --      Head Circumference --      Peak Flow --      Pain Score 12/02/16 1733 1     Pain Loc --      Pain Edu? --      Excl. in [place]? --    No data found.   Updated Vital Signs BP 118/83 (BP Location: Left Arm)   Pulse 82   Temp 98.4 F (36.9 C) (Oral)   Resp 18   SpO2 97%   Physical Exam  Constitutional: He is oriented to person, place, and time. He appears well-developed and well-nourished. No distress.  HENT:  Head: Normocephalic and atraumatic.  Right Ear: External ear normal.  Left Ear: External ear normal.  Nose: Right sinus exhibits frontal sinus tenderness. Right sinus exhibits no maxillary sinus tenderness. Left sinus exhibits frontal sinus tenderness. Left sinus exhibits no maxillary sinus tenderness.  Mouth/Throat: Uvula is midline, oropharynx is clear and moist and mucous membranes are normal.  Bilateral cerumen impaction. TM not visible.  After ear irrigation, left ear TM seen with mild erythema. Right ear was unable to remove all ear wax, and TM still not visible.   Eyes: Pupils are equal, round, and reactive to light. Conjunctivae are normal.  Neck: Normal range of motion. Neck supple.  Cardiovascular: Normal rate, regular rhythm and normal heart sounds.  Exam reveals no gallop and no friction rub.   No murmur heard. Pulmonary/Chest: Effort normal and breath sounds normal. He has no decreased breath sounds. He has no wheezes. He has no rhonchi. He has no rales.  Lymphadenopathy:    He has no cervical adenopathy.  Neurological: He is alert and oriented to person, place, and time.  Skin: Skin is warm and dry.  Psychiatric: He has a normal mood and affect. His behavior is normal. Judgment normal.     UC Treatments / Results  Labs (all labs ordered are listed, but only abnormal results are displayed) Labs  Reviewed - No data to display  EKG  EKG Interpretation None       Radiology No results found.  Procedures Procedures (including critical care time)  Medications Ordered in UC Medications - No data to display   Initial Impression / Assessment and Plan / UC Course  I have reviewed the triage vital signs and the nursing notes.  Pertinent labs &  imaging results that were available during my care of the patient were reviewed by me and considered in my medical decision making (see chart for details).    Flonase for nasal congestion. Patient to use otc cerumenolytics to help soften right ear cerumen and can follow up here or with PCP for irrigation as needed. Patient with Cosopt on medication list, does not know if he has glaucoma, will have patient clear with ophthalmologist for antihistamine use. Discussed other symptomatic treatment. Return precautions given.    Final Clinical Impressions(s) / UC Diagnoses   Final diagnoses:  Bilateral impacted cerumen  Nasal congestion    New Prescriptions Discharge Medication List as of 12/02/2016  6:59 PM    START taking these medications   Details  fluticasone (FLONASE) 50 MCG/ACT nasal spray Place 2 sprays into both nostrils daily., Starting Wed 12/02/2016, Normal           Ok Edwards, [place] 12/02/16 1906

## 2017-02-17 ENCOUNTER — Telehealth: Payer: Self-pay | Admitting: Internal Medicine

## 2017-02-17 MED ORDER — ZOSTER VAC RECOMB ADJUVANTED 50 MCG/0.5ML IM SUSR: 0.5000 mL | Freq: Once | INTRAMUSCULAR | 1 refills | Status: AC

## 2017-02-17 NOTE — Telephone Encounter (Signed)
Requesting approval for shingrix script to be sent to [facility] on [address].   °

## 2017-02-17 NOTE — Telephone Encounter (Signed)
Sent to pharmacy 

## 2017-02-22 NOTE — Progress Notes (Signed)
Subjective:   Tommy Browning. is a 83 y.o. male who presents for Medicare Annual/Subsequent preventive examination.  Review of Systems:  No ROS.  Medicare Wellness Visit. Additional risk factors are reflected in the social history.  Cardiac Risk Factors include: advanced age (>63men, >22 women);male gender Sleep patterns: gets up 1-2 times nightly to void and sleeps 6-7 hours nightly.    Home Safety/Smoke Alarms: Feels safe in home. Smoke alarms in place.  Living environment; residence and Firearm Safety: 2-story house, no firearms.Lives with wife, no needs for DME, good support system Seat Belt Safety/Bike Helmet: Wears seat belt.     Objective:    Vitals: BP 127/80   Pulse 77   Resp 20   Ht 5\' 10"  (1.778 m)   Wt 159 lb (72.1 kg)   SpO2 98%   BMI 22.81 kg/m   Body mass index is 22.81 kg/m.  Tobacco Social History   Tobacco Use  Smoking Status Former Smoker  . Types: Cigarettes  Smokeless Tobacco Never Used  Tobacco Comment   1-2 cigarettes per day     Counseling given: Not Answered Comment: 1-2 cigarettes per day   Past Medical History:  Diagnosis Date  . BACK PAIN 02/24/2008  . Diplopia   . Diverticulosis   . ELECTROCARDIOGRAM, ABNORMAL 12/08/2006   F/U by a normal stress test on September 2005  . ERECTILE DYSFUNCTION, ORGANIC 05/30/2009  . History of thyroid nodule    Resolved by [place] 11/2006  . [place], MILD 05/30/2009  . HYPERTHYROIDISM 12/08/2006   [place]  . HYPERTROPHY PROSTATE W/UR OBST & OTH LUTS 02/26/2009  . HYPOGONADISM 02/03/2010  . Ischemic colitis ([place])   . Tubular adenoma of colon 2004   Past Surgical History:  Procedure Laterality Date  . COLONOSCOPY    . FLEXIBLE SIGMOIDOSCOPY N/A 03/08/2015   Procedure: FLEXIBLE SIGMOIDOSCOPY;  Surgeon: Ladene Artist, MD;  Location: [facility] ENDOSCOPY;  Service: Endoscopy;  Laterality: N/A;   Family History  Problem Relation Age of Onset  . Colon cancer Sister 80  . Stroke Other        GP  .  Diabetes Neg Hx   . Thyroid disease Neg Hx        no goiter or other thyroid problem  . Stomach cancer Neg Hx   . Prostate cancer Neg Hx    Social History   Substance and Sexual Activity  Sexual Activity Not on file   Comment: casual smoker    Outpatient Encounter Medications as of 02/23/2017  Medication Sig  . aspirin 81 MG tablet Take 81 mg by mouth daily.    . carboxymethylcellulose (REFRESH PLUS) 0.5 % SOLN Place 1 drop into both eyes 4 (four) times daily - after meals and at bedtime.  . cyanocobalamin 1000 MCG tablet Take 100 mcg by mouth daily.    Marland Kitchen donepezil (ARICEPT) 5 MG tablet TAKE 1 TABLET (5 MG TOTAL) BY MOUTH AT BEDTIME.  . dorzolamide-timolol (COSOPT) 22.3-6.8 MG/ML ophthalmic solution Place 1 drop into both eyes every 12 (twelve) hours.   . fish oil-omega-3 fatty acids 1000 MG capsule Take 2 g by mouth daily.    . Multiple Vitamin (MULTIVITAMIN) tablet Take 1 tablet by mouth daily.    . [DISCONTINUED] fluticasone (FLONASE) 50 MCG/ACT nasal spray Place 2 sprays into both nostrils daily. (Patient not taking: Reported on 02/23/2017)   No facility-administered encounter medications on file as of 02/23/2017.     Activities of Daily Living In your present  state of health, do you have any difficulty performing the following activities: 02/23/2017  Hearing? N  Vision? N  Difficulty concentrating or making decisions? Y  Walking or climbing stairs? N  Dressing or bathing? N  Doing errands, shopping? Y  Preparing Food and eating ? N  Using the Toilet? N  In the past six months, have you accidently leaked urine? N  Do you have problems with loss of bowel control? N  Managing your Medications? Y  Managing your Finances? Y  Housekeeping or managing your Housekeeping? N  Some recent data might be hidden    Patient Care Team: Hoyt Koch, MD as PCP - General (Internal Medicine) Meryl Crutch Vickki Muff, MD as Referring Physician (Ophthalmology) Arta Bruce, [place] as Referring Physician (Ophthalmology) Kathrynn Ducking, MD as Consulting Physician (Neurology) Renato Shin, MD as Consulting Physician (Endocrinology)   Assessment:    Physical assessment deferred to PCP.  Exercise Activities and Dietary recommendations Current Exercise Habits: Structured exercise class, Type of exercise: strength training/weights;treadmill;walking, Time (Minutes): 55, Frequency (Times/Week): 2, Weekly Exercise (Minutes/Week): 110, Intensity: Mild, Exercise limited by: orthopedic condition(s)  Diet (meal preparation, eat out, water intake, caffeinated beverages, dairy products, fruits and vegetables): in general, a "healthy" diet  , patient reports that his appetite has been decreased.   Encouraged patient to increase daily water intake and to supplement with ensure, ensure coupons were provided. Discussed small frequent meals throughout the day.   Goals    . 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      Fall Risk Fall Risk  02/23/2017 12/01/2016 03/06/2016 03/20/2015 12/28/2012  Falls in the past year? No No No No No  Comment - - Emmi Telephone Survey: data to providers prior to load - -  Risk for fall due to : Impaired vision;Impaired balance/gait - - - -   Depression Screen PHQ 2/9 Scores 02/23/2017 03/20/2015 12/28/2012  PHQ - 2 Score 2 2 0  PHQ- 9 Score 6 11 -    Cognitive Function MMSE - Mini Mental State Exam 02/23/2017 12/01/2016 05/28/2016  Orientation to time 4 4 5   Orientation to Place 5 5 4   Registration 3 3 3   Attention/ Calculation 4 1 5   Recall 0 2 1  Language- name 2 objects 2 2 2   Language- repeat 1 1 1   Language- follow 3 step command 3 3 3   Language- read & follow direction 1 1 1   Write a sentence 1 1 1   Copy design 1 1 1   Total score 25 24 27         Immunization History    Administered Date(s) Administered  . Influenza Split 01/27/2011  . Influenza Whole 02/26/2009  . Influenza, High Dose Seasonal PF 01/01/2016  . Influenza,inj,Quad PF,6+ Mos 12/31/2014  . Influenza-Unspecified 12/28/2013, 01/14/2014  . Pneumococcal Conjugate-13 12/31/2014  . Pneumococcal Polysaccharide-23 06/28/2005  . Td 04/30/2005  . Tdap 01/01/2016   Screening Tests Health Maintenance  Topic Date Due  . INFLUENZA VACCINE  10/28/2016  . TETANUS/TDAP  12/31/2025  . PNA vac Low Risk Adult  Completed      Plan:  Continue doing brain stimulating activities (puzzles, reading, adult coloring books, staying active) to keep memory sharp.   Continue to eat heart healthy diet (full of fruits, vegetables, whole  grains, lean protein, water--limit salt, fat, and sugar intake) and increase physical activity as tolerated.  No flu shot today due to patient has been sick with cold symptoms for the last week. Patient instructed to call back for an appointment to receive flu shot here or go to his pharmacy for a flu shot when he is feeling better.   I have personally reviewed and noted the following in the patient's chart:   . Medical and social history . Use of alcohol, tobacco or illicit drugs  . Current medications and supplements . Functional ability and status . Nutritional status . Physical activity . Advanced directives . List of other physicians . Vitals . Screenings to include cognitive, depression, and falls . Referrals and appointments  In addition, I have reviewed and discussed with patient certain preventive protocols, quality metrics, and best practice recommendations. A written personalized care plan for preventive services as well as general preventive health recommendations were provided to patient.     Michiel Cowboy, RN  02/23/2017

## 2017-02-23 ENCOUNTER — Ambulatory Visit (INDEPENDENT_AMBULATORY_CARE_PROVIDER_SITE_OTHER): Payer: Medicare Other | Admitting: *Deleted

## 2017-02-23 VITALS — BP 127/80 | HR 77 | Resp 20 | Ht 70.0 in | Wt 159.0 lb

## 2017-02-23 DIAGNOSIS — Z0000 Encounter for general adult medical examination without abnormal findings: Secondary | ICD-10-CM | POA: Diagnosis not present

## 2017-02-23 NOTE — Patient Instructions (Addendum)
Web-site for free memory games: http://games.[employer].dk   Continue doing brain stimulating activities (puzzles, reading, adult coloring books, staying active) to keep memory sharp.   Continue to eat heart healthy diet (full of fruits, vegetables, whole grains, lean protein, water--limit salt, fat, and sugar intake) and increase physical activity as tolerated.  No flu shot today due to patient has been sick with cold symptoms for the last week. May call back to receive flu shot here or go to your pharmacy for a flu shot when you are feeling better.   Mr. Botero , Thank you for taking time to come for your Medicare Wellness Visit. I appreciate your ongoing commitment to your health goals. Please review the following plan we discussed and let me know if I can assist you in the future.   These are the goals we discussed: Goals    . 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       This is a list of the screening recommended for you and due dates:  Health Maintenance  Topic Date Due  . Flu Shot  10/28/2016  . Tetanus Vaccine  12/31/2025  . Pneumonia vaccines  Completed

## 2017-02-23 NOTE — Progress Notes (Signed)
Medical screening examination/treatment/procedure(s) were performed by non-physician practitioner and as supervising physician I was immediately available for consultation/collaboration. I agree with above. Vietta Bonifield A Marynell Bies, MD 

## 2017-03-10 ENCOUNTER — Encounter: Payer: Medicare Other | Admitting: Internal Medicine

## 2017-03-12 ENCOUNTER — Other Ambulatory Visit (INDEPENDENT_AMBULATORY_CARE_PROVIDER_SITE_OTHER): Payer: Medicare Other

## 2017-03-12 ENCOUNTER — Ambulatory Visit (INDEPENDENT_AMBULATORY_CARE_PROVIDER_SITE_OTHER): Payer: Medicare Other | Admitting: Internal Medicine

## 2017-03-12 ENCOUNTER — Encounter: Payer: Self-pay | Admitting: Internal Medicine

## 2017-03-12 VITALS — BP 110/80 | HR 68 | Temp 97.6°F | Ht 70.0 in | Wt 159.0 lb

## 2017-03-12 DIAGNOSIS — E785 Hyperlipidemia, unspecified: Secondary | ICD-10-CM

## 2017-03-12 DIAGNOSIS — E789 Disorder of lipoprotein metabolism, unspecified: Secondary | ICD-10-CM | POA: Diagnosis not present

## 2017-03-12 DIAGNOSIS — Z0000 Encounter for general adult medical examination without abnormal findings: Secondary | ICD-10-CM

## 2017-03-12 DIAGNOSIS — Z23 Encounter for immunization: Secondary | ICD-10-CM | POA: Diagnosis not present

## 2017-03-12 DIAGNOSIS — R634 Abnormal weight loss: Secondary | ICD-10-CM

## 2017-03-12 LAB — COMPREHENSIVE METABOLIC PANEL
ALT: 12 U/L (ref 0–53)
AST: 23 U/L (ref 0–37)
Albumin: 4.1 g/dL (ref 3.5–5.2)
Alkaline Phosphatase: 68 U/L (ref 39–117)
BUN: 15 mg/dL (ref 6–23)
CALCIUM: 9.3 mg/dL (ref 8.4–10.5)
CHLORIDE: 102 meq/L (ref 96–112)
CO2: 31 meq/L (ref 19–32)
Creatinine, Ser: 1.14 mg/dL (ref 0.40–1.50)
GFR: 78.42 mL/min (ref >60.00)
Glucose, Bld: 70 mg/dL (ref 70–99)
Potassium: 4.3 mEq/L (ref 3.5–5.1)
Sodium: 138 mEq/L (ref 135–145)
Total Bilirubin: 0.7 mg/dL (ref 0.2–1.2)
Total Protein: 7.1 g/dL (ref 6.0–8.3)

## 2017-03-12 LAB — LIPID PANEL
CHOL/HDL RATIO: 4
Cholesterol: 240 mg/dL — ABNORMAL HIGH (ref 0–200)
HDL: 57.4 mg/dL (ref >39.00)
NONHDL: 182.58
TRIGLYCERIDES: 224 mg/dL — AB (ref 0.0–149.0)
VLDL: 44.8 mg/dL — ABNORMAL HIGH (ref 0.0–40.0)

## 2017-03-12 LAB — CBC
HEMATOCRIT: 43.6 % (ref 39.0–52.0)
HEMOGLOBIN: 14.4 g/dL (ref 13.0–17.0)
MCHC: 33.1 g/dL (ref 30.0–36.0)
MCV: 91.8 fl (ref 78.0–100.0)
PLATELETS: 175 10*3/uL (ref 150.0–400.0)
RBC: 4.75 Mil/uL (ref 4.22–5.81)
RDW: 14.5 % (ref 11.5–15.5)
WBC: 5 10*3/uL (ref 4.0–10.5)

## 2017-03-12 LAB — LDL CHOLESTEROL, DIRECT: Direct LDL: 163 mg/dL

## 2017-03-12 MED ORDER — IPRATROPIUM BROMIDE 0.06 % NA SOLN: 2.0000 | Freq: Four times a day (QID) | NASAL | 12 refills | Status: DC

## 2017-03-12 NOTE — Assessment & Plan Note (Signed)
Weight stable for the last year.

## 2017-03-12 NOTE — Progress Notes (Signed)
   Subjective:    Patient ID: Tommy Luke., male    DOB: 1929/11/20, 83 y.o.   MRN: [id number]  HPI The patient is an 83 YO man coming in for physical.  PMH, [facility], social history reviewed and updated.   Review of Systems  Constitutional: Negative.   HENT: Positive for postnasal drip.   Eyes: Negative.   Respiratory: Negative for cough, chest tightness and shortness of breath.   Cardiovascular: Negative for chest pain, palpitations and leg swelling.  Gastrointestinal: Negative for abdominal distention, abdominal pain, constipation, diarrhea, nausea and vomiting.  Musculoskeletal: Negative.   Skin: Negative.   Neurological: Negative.   Psychiatric/Behavioral: Negative.       Objective:   Physical Exam  Constitutional: He is oriented to person, place, and time. He appears well-developed and well-nourished.  HENT:  Head: Normocephalic and atraumatic.  Eyes: EOM are normal.  Neck: Normal range of motion.  Cardiovascular: Normal rate and regular rhythm.  Pulmonary/Chest: Effort normal and breath sounds normal. No respiratory distress. He has no wheezes. He has no rales.  Abdominal: Soft. Bowel sounds are normal. He exhibits no distension. There is no tenderness. There is no rebound.  Musculoskeletal: He exhibits no edema.  Neurological: He is alert and oriented to person, place, and time. Coordination normal.  Skin: Skin is warm and dry.  Psychiatric: He has a normal mood and affect.   Vitals:   03/12/17 1447  BP: 110/80  Pulse: 68  Temp: 97.6 F (36.4 C)  TempSrc: Oral  SpO2: 93%  Weight: 159 lb (72.1 kg)  Height: 5\' 10"  (1.778 m)      Assessment & Plan:  Flu shot given at visit

## 2017-03-12 NOTE — Assessment & Plan Note (Signed)
Checking lipid panel and adjust as needed.  

## 2017-03-12 NOTE — Assessment & Plan Note (Signed)
Given flu shot today, tetanus and pneumonia up to date. Colonoscopy aged out. Counseled about shingrix today. Counseled about fall safety and benefits of routine exercise. Given screening recommendations.

## 2017-03-12 NOTE — Patient Instructions (Signed)
We have sent in a medicine you use in the nose up to 3 times per day as needed for nose drainage and sinus congestion.  We have given you the flu shot today and will check the blood work.   Health Maintenance, Male A healthy lifestyle and preventive care is important for your health and wellness. Ask your health care provider about what schedule of regular examinations is right for you. What should I know about weight and diet? Eat a Healthy Diet  Eat plenty of vegetables, fruits, whole grains, low-fat dairy products, and lean protein.  Do not eat a lot of foods high in solid fats, added sugars, or salt.  Maintain a Healthy Weight Regular exercise can help you achieve or maintain a healthy weight. You should:  Do at least 150 minutes of exercise each week. The exercise should increase your heart rate and make you sweat (moderate-intensity exercise).  Do strength-training exercises at least twice a week.  Watch Your Levels of Cholesterol and Blood Lipids  Have your blood tested for lipids and cholesterol every 5 years starting at 81 years of age. If you are at high risk for heart disease, you should start having your blood tested when you are 81 years old. You may need to have your cholesterol levels checked more often if: ? Your lipid or cholesterol levels are high. ? You are older than 81 years of age. ? You are at high risk for heart disease.  What should I know about cancer screening? Many types of cancers can be detected early and may often be prevented. Lung Cancer  You should be screened every year for lung cancer if: ? You are a current smoker who has smoked for at least 30 years. ? You are a former smoker who has quit within the past 15 years.  Talk to your health care provider about your screening options, when you should start screening, and how often you should be screened.  Colorectal Cancer  Routine colorectal cancer screening usually begins at 81 years of age and  should be repeated every 5-10 years until you are 81 years old. You may need to be screened more often if early forms of precancerous polyps or small growths are found. Your health care provider may recommend screening at an earlier age if you have risk factors for colon cancer.  Your health care provider may recommend using home test kits to check for hidden blood in the stool.  A small camera at the end of a tube can be used to examine your colon (sigmoidoscopy or colonoscopy). This checks for the earliest forms of colorectal cancer.  Prostate and Testicular Cancer  Depending on your age and overall health, your health care provider may do certain tests to screen for prostate and testicular cancer.  Talk to your health care provider about any symptoms or concerns you have about testicular or prostate cancer.  Skin Cancer  Check your skin from head to toe regularly.  Tell your health care provider about any new moles or changes in moles, especially if: ? There is a change in a mole's size, shape, or color. ? You have a mole that is larger than a pencil eraser.  Always use sunscreen. Apply sunscreen liberally and repeat throughout the day.  Protect yourself by wearing long sleeves, pants, a wide-brimmed hat, and sunglasses when outside.  What should I know about heart disease, diabetes, and high blood pressure?  If you are 6-97 years of age, have  your blood pressure checked every 3-5 years. If you are 30 years of age or older, have your blood pressure checked every year. You should have your blood pressure measured twice-once when you are at a hospital or clinic, and once when you are not at a hospital or clinic. Record the average of the two measurements. To check your blood pressure when you are not at a hospital or clinic, you can use: ? An automated blood pressure machine at a pharmacy. ? A home blood pressure monitor.  Talk to your health care provider about your target blood  pressure.  If you are between 84-30 years old, ask your health care provider if you should take aspirin to prevent heart disease.  Have regular diabetes screenings by checking your fasting blood sugar level. ? If you are at a normal weight and have a low risk for diabetes, have this test once every three years after the age of 40. ? If you are overweight and have a high risk for diabetes, consider being tested at a younger age or more often.  A one-time screening for abdominal aortic aneurysm (AAA) by ultrasound is recommended for men aged 67-75 years who are current or former smokers. What should I know about preventing infection? Hepatitis B If you have a higher risk for hepatitis B, you should be screened for this virus. Talk with your health care provider to find out if you are at risk for hepatitis B infection. Hepatitis C Blood testing is recommended for:  Everyone born from [birth year] through 1963.  Anyone with known risk factors for hepatitis C.  Sexually Transmitted Diseases (STDs)  You should be screened each year for STDs including gonorrhea and chlamydia if: ? You are sexually active and are younger than 81 years of age. ? You are older than 81 years of age and your health care provider tells you that you are at risk for this type of infection. ? Your sexual activity has changed since you were last screened and you are at an increased risk for chlamydia or gonorrhea. Ask your health care provider if you are at risk.  Talk with your health care provider about whether you are at high risk of being infected with HIV. Your health care provider may recommend a prescription medicine to help prevent HIV infection.  What else can I do?  Schedule regular health, dental, and eye exams.  Stay current with your vaccines (immunizations).  Do not use any tobacco products, such as cigarettes, chewing tobacco, and e-cigarettes. If you need help quitting, ask your health care  provider.  Limit alcohol intake to no more than 2 drinks per day. One drink equals 12 ounces of beer, 5 ounces of wine, or 1 ounces of hard liquor.  Do not use street drugs.  Do not share needles.  Ask your health care provider for help if you need support or information about quitting drugs.  Tell your health care provider if you often feel depressed.  Tell your health care provider if you have ever been abused or do not feel safe at home. This information is not intended to replace advice given to you by your health care provider. Make sure you discuss any questions you have with your health care provider. Document Released: 09/12/2007 Document Revised: 11/13/2015 Document Reviewed: 12/18/2014 Elsevier Interactive Patient Education  [employer].

## 2017-03-17 ENCOUNTER — Telehealth: Payer: Self-pay | Admitting: *Deleted

## 2017-03-17 NOTE — Telephone Encounter (Signed)
Called, LVM for pt to call office back. I got a refill request for his donepezil (Aricept) that he takes for his memory. It was for the 5mg  tablet. Last office visit stated that he was taking 10mg  tablet at bedtime. I need clarification from him on what dose he is taking.  Gave [facility] phone number for call back

## 2017-03-19 MED ORDER — DONEPEZIL HCL 5 MG PO TABS: 5.0000 mg | ORAL_TABLET | Freq: Every day | ORAL | 3 refills | Status: DC

## 2017-03-19 NOTE — Telephone Encounter (Signed)
Tried home number, got busy signal. Tried mobile, went to VM. I LVM for pt to call back.

## 2017-03-19 NOTE — Telephone Encounter (Signed)
Ok per MM, NP to refill 5mg  tablet qhs that is on pt med list.

## 2017-03-24 ENCOUNTER — Telehealth: Payer: Self-pay | Admitting: Adult Health

## 2017-03-25 NOTE — Telephone Encounter (Signed)
Error

## 2017-04-05 ENCOUNTER — Encounter (HOSPITAL_COMMUNITY): Payer: Self-pay | Admitting: Emergency Medicine

## 2017-04-05 ENCOUNTER — Ambulatory Visit (HOSPITAL_COMMUNITY)
Admission: EM | Admit: 2017-04-05 | Discharge: 2017-04-05 | Disposition: A | Discharge status: Home / Self Care | Payer: Medicare Other | Attending: Urgent Care | Admitting: Urgent Care

## 2017-04-05 ENCOUNTER — Ambulatory Visit (INDEPENDENT_AMBULATORY_CARE_PROVIDER_SITE_OTHER): Payer: Medicare Other

## 2017-04-05 DIAGNOSIS — R0789 Other chest pain: Secondary | ICD-10-CM

## 2017-04-05 DIAGNOSIS — R5383 Other fatigue: Secondary | ICD-10-CM

## 2017-04-05 DIAGNOSIS — J029 Acute pharyngitis, unspecified: Secondary | ICD-10-CM

## 2017-04-05 MED ORDER — PSEUDOEPHEDRINE HCL 30 MG PO TABS: 30.0000 mg | ORAL_TABLET | Freq: Three times a day (TID) | ORAL | 0 refills | Status: DC | PRN

## 2017-04-05 MED ORDER — CETIRIZINE HCL 10 MG PO TABS: 10.0000 mg | ORAL_TABLET | Freq: Every day | ORAL | 11 refills | Status: DC

## 2017-04-05 NOTE — ED Triage Notes (Signed)
  PT reports respiratory symptoms for 1 month. PT reports cough is sometimes productive. PT saw his PCP 12/19 for same.

## 2017-04-05 NOTE — ED Provider Notes (Signed)
  MRN: [id number] DOB: [birth year]  Subjective:   Tommy Browning. is a 84 y.o. male presenting for 1 month history of chest tightness, shob, sinus pressure, sore throat in the morning, mild productive cough. Has not tried any medications for relief. Denies fever, sinus pain, ear pain, chest pain, n/v, abdominal pain. Denies smoking cigarettes. Denies history of allergies, asthma. Patient was seen by his PCP on 03/17/2017, recommended supportive care. He has not had any improvement since then.  Tommy Browning has No Known Allergies.  Tommy Browning  has a past medical history of BACK PAIN (02/24/2008), Diplopia, Diverticulosis, ELECTROCARDIOGRAM, ABNORMAL (12/08/2006), ERECTILE DYSFUNCTION, ORGANIC (05/30/2009), History of thyroid nodule, HYPERLIPIDEMIA, MILD (05/30/2009), HYPERTHYROIDISM (12/08/2006), HYPERTROPHY PROSTATE W/UR OBST & OTH LUTS (02/26/2009), HYPOGONADISM (02/03/2010), Ischemic colitis ([place]), and Tubular adenoma of colon (2004). Also  has a past surgical history that includes Colonoscopy and Flexible sigmoidoscopy (N/A, 03/08/2015).  Objective:   Vitals: BP (!) 124/91 (BP Location: Left Arm) Comment: notified RN  Pulse 70   Temp 97.6 F (36.4 C) (Oral)   Resp 18   Wt 159 lb (72.1 kg)   SpO2 97%   BMI 22.81 kg/m   Physical Exam  Constitutional: He is oriented to person, place, and time. He appears well-developed and well-nourished.  HENT:  TM's intact bilaterally, no effusions or erythema. Nasal turbinates pink, dry, nasal passages patent. No sinus tenderness. Oropharynx clear, mucous membranes moist.  Eyes: Right eye exhibits no discharge. Left eye exhibits no discharge.  Neck: Normal range of motion. Neck supple.  Cardiovascular: Normal rate, regular rhythm and intact distal pulses. Exam reveals no gallop and no friction rub.  No murmur heard. Pulmonary/Chest: No respiratory distress. He has no wheezes. He has no rales.  Lymphadenopathy:    He has no cervical adenopathy.  Neurological: He is alert  and oriented to person, place, and time.  Skin: Skin is warm and dry.  Psychiatric:  Lethargic.   Dg Chest 2 View  Result Date: 04/05/2017 CLINICAL DATA:  Productive cough over the last month. Chest tightness. EXAM: CHEST  2 VIEW COMPARISON:  04/07/2016 FINDINGS: Pectus deformity of the chest. Allowing for that, the heart and mediastinal shadows are normal. Lungs are clear. The vascularity is normal. No effusions. Mild midthoracic degenerative changes. IMPRESSION: No active cardiopulmonary disease. Electronically Signed   By: Nelson Chimes M.D.   On: 04/05/2017 14:58   Assessment and Plan :   Sore throat  Chest tightness  Other fatigue   Chest x-ray reassuring. Recommended supportive care, recheck with PCP. Return-to-clinic precautions discussed, patient verbalized understanding.    Jaynee Eagles, PA-C 04/05/17 1526

## 2017-04-05 NOTE — Discharge Instructions (Signed)
For sore throat try using a honey-based tea. Use 3 teaspoons of honey with juice squeezed from half lemon. Place shaved pieces of ginger into 1/2-1 cup of water and warm over stove top. Then mix the ingredients and repeat every 4 hours as needed. 

## 2017-05-31 ENCOUNTER — Ambulatory Visit: Payer: Medicare Other | Admitting: Adult Health

## 2017-05-31 ENCOUNTER — Encounter: Payer: Self-pay | Admitting: Adult Health

## 2017-05-31 VITALS — BP 112/70 | HR 84 | Ht 69.5 in | Wt 156.2 lb

## 2017-05-31 DIAGNOSIS — R413 Other amnesia: Secondary | ICD-10-CM | POA: Diagnosis not present

## 2017-05-31 MED ORDER — DONEPEZIL HCL 10 MG PO TABS: 10.0000 mg | ORAL_TABLET | Freq: Every day | ORAL | 11 refills | Status: DC

## 2017-05-31 NOTE — Patient Instructions (Addendum)
Your Plan:  Increase Aricept to 10 mg at bedtime Monitor weight. If you start to notice weightloss let me know If your symptoms worsen or you develop new symptoms please let Tommy Browning know.   Thank you for coming to see Tommy Browning at [facility]. I hope we have been able to provide you high quality care today.  You may receive a patient satisfaction survey over the next few weeks. We would appreciate your feedback and comments so that we may continue to improve ourselves and the health of our patients.  Donepezil tablets What is this medicine? DONEPEZIL (doe NEP e zil) is used to treat mild to moderate dementia caused by Alzheimer's disease. This medicine may be used for other purposes; ask your health care provider or pharmacist if you have questions. COMMON BRAND NAME(S): Aricept What should I tell my health care provider before I take this medicine? They need to know if you have any of these conditions: -asthma or other lung disease -difficulty passing urine -head injury -heart disease -history of irregular heartbeat -liver disease -seizures (convulsions) -stomach or intestinal disease, ulcers or stomach bleeding -an unusual or allergic reaction to donepezil, other medicines, foods, dyes, or preservatives -pregnant or trying to get pregnant -breast-feeding How should I use this medicine? Take this medicine by mouth with a glass of water. Follow the directions on the prescription label. You may take this medicine with or without food. Take this medicine at regular intervals. This medicine is usually taken before bedtime. Do not take it more often than directed. Continue to take your medicine even if you feel better. Do not stop taking except on your doctor's advice. If you are taking the 23 mg donepezil tablet, swallow it whole; do not cut, crush, or chew it. Talk to your pediatrician regarding the use of this medicine in children. Special care may be needed. Overdosage: If you  think you have taken too much of this medicine contact a poison control center or emergency room at once. NOTE: This medicine is only for you. Do not share this medicine with others. What if I miss a dose? If you miss a dose, take it as soon as you can. If it is almost time for your next dose, take only that dose, do not take double or extra doses. What may interact with this medicine? Do not take this medicine with any of the following medications: -certain medicines for fungal infections like itraconazole, fluconazole, posaconazole, and voriconazole -cisapride -dextromethorphan; quinidine -dofetilide -dronedarone -pimozide -quinidine -thioridazine -ziprasidone This medicine may also interact with the following medications: -antihistamines for allergy, cough and cold -atropine -bethanechol -carbamazepine -certain medicines for bladder problems like oxybutynin, tolterodine -certain medicines for Parkinson's disease like benztropine, trihexyphenidyl -certain medicines for stomach problems like dicyclomine, hyoscyamine -certain medicines for travel sickness like scopolamine -dexamethasone -ipratropium -NSAIDs, medicines for pain and inflammation, like ibuprofen or naproxen -other medicines for Alzheimer's disease -other medicines that prolong the QT interval (cause an abnormal heart rhythm) -phenobarbital -phenytoin -rifampin, rifabutin or rifapentine This list may not describe all possible interactions. Give your health care provider a list of all the medicines, herbs, non-prescription drugs, or dietary supplements you use. Also tell them if you smoke, drink alcohol, or use illegal drugs. Some items may interact with your medicine. What should I watch for while using this medicine? Visit your doctor or health care professional for regular checks on your progress. Check with your doctor or health care professional if your symptoms do not get better  or if they get worse. You may get  drowsy or dizzy. Do not drive, use machinery, or do anything that needs mental alertness until you know how this drug affects you. What side effects may I notice from receiving this medicine? Side effects that you should report to your doctor or health care professional as soon as possible: -allergic reactions like skin rash, itching or hives, swelling of the face, lips, or tongue -feeling faint or lightheaded, falls -loss of bladder control -seizures -signs and symptoms of a dangerous change in heartbeat or heart rhythm like chest pain; dizziness; fast or irregular heartbeat; palpitations; feeling faint or lightheaded, falls; breathing problems -signs and symptoms of infection like fever or chills; cough; sore throat; pain or trouble passing urine -signs and symptoms of liver injury like dark yellow or brown urine; general ill feeling or flu-like symptoms; light-colored stools; loss of appetite; nausea; right upper belly pain; unusually weak or tired; yellowing of the eyes or skin -slow heartbeat or palpitations -unusual bleeding or bruising -vomiting Side effects that usually do not require medical attention (report to your doctor or health care professional if they continue or are bothersome): -diarrhea, especially when starting treatment -headache -loss of appetite -muscle cramps -nausea -stomach upset This list may not describe all possible side effects. Call your doctor for medical advice about side effects. You may report side effects to FDA at 1-800-FDA-1088. Where should I keep my medicine? Keep out of reach of children. Store at room temperature between 15 and 30 degrees C (59 and 86 degrees F). Throw away any unused medicine after the expiration date. NOTE: This sheet is a summary. It may not cover all possible information. If you have questions about this medicine, talk to your doctor, pharmacist, or health care provider.  2018 Elsevier/Gold Standard (2015-09-02 21:00:42)

## 2017-05-31 NOTE — Progress Notes (Signed)
I have read the note, and I agree with the clinical assessment and plan.  Charles K Willis   

## 2017-05-31 NOTE — Progress Notes (Signed)
PATIENT: Tommy Browning. DOB: [birth year]  REASON FOR VISIT: follow up HISTORY FROM: patient  HISTORY OF PRESENT ILLNESS: Today 05/31/17 Tommy Browning is an 84 year old male with a history of memory disturbance.  He returns today for follow-up.  He is currently on Aricept 5 mg at bedtime.  He reports that his weight has been stable.  He does not feel that there is been a significant change in his memory.  He continues to live at home with his wife.  He is able to complete all ADLs independently.  Denies any trouble sleeping.  Reports that his appetite is up and down.  His wife states that his mood is better.  He denies any new neurological symptoms.  Returns today for evaluation.  HISTORY 12/01/16  Tommy Browning is an 84 year old male with a history of memory disturbance. He returns today for follow-up. He is currently on Aricept 10 mg daily and tolerating it well. His wife feels that his memory may be slightly better although the patient feels that has remained the same. He lives at home with his wife. He is able to complete all ADLs independently. He continues to manage his own finances without difficulty. Denies any trouble sleeping. Denies any changes in his appetite. His wife notes that when he originally started Aricept she noticed that he was easily agitated however that has improved. 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 He does not feel that it was due to his memory although his wife feels that it was. He returns today for an evaluation.  REVIEW OF SYSTEMS: Out of a complete 14 system review of symptoms, the patient complains only of the following symptoms, and all other reviewed systems are negative.  Ringing in ears, runny nose, light sensitivity, double vision, appetite change, agitation, depression, sleep talking, memory loss  ALLERGIES: No Known Allergies  HOME MEDICATIONS: Outpatient Medications Prior to Visit  Medication Sig  Dispense Refill  . aspirin 81 MG tablet Take 81 mg by mouth daily.      . carboxymethylcellulose (REFRESH PLUS) 0.5 % SOLN Place 1 drop into both eyes 4 (four) times daily - after meals and at bedtime.    . cyanocobalamin 1000 MCG tablet Take 1,000 mcg by mouth daily.     Marland Kitchen donepezil (ARICEPT) 5 MG tablet Take 1 tablet (5 mg total) by mouth at bedtime. 30 tablet 3  . dorzolamide-timolol (COSOPT) 22.3-6.8 MG/ML ophthalmic solution Place 1 drop into both eyes every 12 (twelve) hours.     . fish oil-omega-3 fatty acids 1000 MG capsule Take 2 g by mouth daily.      . Multiple Vitamin (MULTIVITAMIN) tablet Take 1 tablet by mouth daily.      . cetirizine (ZYRTEC ALLERGY) 10 MG tablet Take 1 tablet (10 mg total) by mouth daily. (Patient not taking: Reported on 05/31/2017) 30 tablet 11  . ipratropium (ATROVENT) 0.06 % nasal spray Place 2 sprays into both nostrils 4 (four) times daily. 15 mL 12  . pseudoephedrine (SUDAFED) 30 MG tablet Take 1 tablet (30 mg total) by mouth every 8 (eight) hours as needed for congestion. 30 tablet 0   No facility-administered medications prior to visit.     PAST MEDICAL HISTORY: Past Medical History:  Diagnosis Date  . BACK PAIN 02/24/2008  . Diplopia   . Diverticulosis   . ELECTROCARDIOGRAM, ABNORMAL 12/08/2006   F/U by a normal stress test on September 2005  .  ERECTILE DYSFUNCTION, ORGANIC 05/30/2009  . History of thyroid nodule    Resolved by [place] 11/2006  . [place], MILD 05/30/2009  . HYPERTHYROIDISM 12/08/2006   [place]  . HYPERTROPHY PROSTATE W/UR OBST & OTH LUTS 02/26/2009  . HYPOGONADISM 02/03/2010  . Ischemic colitis ([place])   . Tubular adenoma of colon 2004    PAST SURGICAL HISTORY: Past Surgical History:  Procedure Laterality Date  . COLONOSCOPY    . FLEXIBLE SIGMOIDOSCOPY N/A 03/08/2015   Procedure: FLEXIBLE SIGMOIDOSCOPY;  Surgeon: Ladene Artist, MD;  Location: [facility] ENDOSCOPY;  Service: Endoscopy;  Laterality: N/A;    FAMILY HISTORY: Family  History  Problem Relation Age of Onset  . Colon cancer Sister 71  . Stroke Other        GP  . Diabetes Neg Hx   . Thyroid disease Neg Hx        no goiter or other thyroid problem  . Stomach cancer Neg Hx   . Prostate cancer Neg Hx     SOCIAL HISTORY: Social History   Socioeconomic History  . Marital status: Married    Spouse name: [name]  . Number of children: 2  . Years of education: 97  . Highest education level: Not on file  Social Needs  . Financial resource strain: Not on file  . Food insecurity - worry: Not on file  . Food insecurity - inability: Not on file  . Transportation needs - medical: Not on file  . Transportation needs - non-medical: Not on file  Occupational History  . 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 Smoking status: Former Smoker    Types: Cigarettes  . Smokeless tobacco: Never Used  . Tobacco comment: 1-2 cigarettes per day  Substance and Sexual Activity  . Alcohol use: Yes    Comment: very rare  . Drug use: No  . Sexual activity: Not on file    Comment: casual smoker  Other Topics Concern  . 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   MMSE - Mini Mental State Exam 05/31/2017 02/23/2017 12/01/2016  Orientation to time 4 4 4   Orientation to Place 4 5 5   Registration 3 3 3   Attention/ Calculation 3 4 1   Recall 0 0 2  Language- name 2 objects 2 2 2   Language- repeat 0 1 1  Language- follow 3 step command 3 3 3   Language- read & follow direction 0 1 1  Write a sentence 0 1 1  Copy design 1 1 1   Total score 20 25 24      Generalized: Well  developed, in no acute distress   Neurological examination  Mentation: Alert. Follows all commands speech and language fluent Cranial nerve II-XII: Pupils were equal round reactive to light. Extraocular movements were full, visual field were full on confrontational test. Facial sensation and strength were normal. Uvula tongue midline. Head turning and shoulder shrug  were normal and symmetric.  Motor: The motor testing reveals 5 over 5 strength of all 4 extremities. Good symmetric motor tone is noted throughout.  Sensory: Sensory testing is intact to soft touch on all 4 extremities. No evidence of extinction is noted.  Coordination: Cerebellar testing reveals good finger-nose-finger and heel-to-shin bilaterally.  Gait and station: Gait is normal.  Reflexes: Deep tendon reflexes are symmetric and normal bilaterally.   DIAGNOSTIC DATA (LABS, IMAGING, TESTING) - I reviewed patient records, labs, notes, testing and imaging myself where available.  Lab Results  Component Value Date   WBC 5.0 03/12/2017   HGB 14.4 03/12/2017   HCT 43.6 03/12/2017   MCV 91.8 03/12/2017   PLT 175.0 03/12/2017      Component Value Date/Time   NA 138 03/12/2017 1515   K 4.3 03/12/2017 1515   CL 102 03/12/2017 1515   CO2 31 03/12/2017 1515   GLUCOSE 70 03/12/2017 1515   BUN 15 03/12/2017 1515   CREATININE 1.14 03/12/2017 1515   CALCIUM 9.3 03/12/2017 1515   PROT 7.1 03/12/2017 1515   ALBUMIN 4.1 03/12/2017 1515   AST 23 03/12/2017 1515   ALT 12 03/12/2017 1515   ALKPHOS 68 03/12/2017 1515   BILITOT 0.7 03/12/2017 1515   GFRNONAA >60 03/09/2015 0311   GFRAA >60 03/09/2015 0311   Lab Results  Component Value Date   CHOL 240 (H) 03/12/2017   HDL 57.40 03/12/2017   LDLCALC 148 (H) 01/01/2016   LDLDIRECT 163.0 03/12/2017   TRIG 224.0 (H) 03/12/2017   CHOLHDL 4 03/12/2017   No results found for: HGBA1C Lab Results  Component Value Date   VITAMINB12 1,031 (H) 04/30/2016   Lab Results  Component  Value Date   TSH 0.78 04/30/2016      ASSESSMENT AND PLAN 84 y.o. year old male  has a past medical history of BACK PAIN (02/24/2008), Diplopia, Diverticulosis, ELECTROCARDIOGRAM, ABNORMAL (12/08/2006), ERECTILE DYSFUNCTION, ORGANIC (05/30/2009), History of thyroid nodule, HYPERLIPIDEMIA, MILD (05/30/2009), HYPERTHYROIDISM (12/08/2006), HYPERTROPHY PROSTATE W/UR OBST & OTH LUTS (02/26/2009), HYPOGONADISM (02/03/2010), Ischemic colitis ([place]), and Tubular adenoma of colon (2004). here with : 1.   1.  Memory disturbance  The patient's memory score has declined since the last visit.  I will increase Aricept to 10 mg at bedtime.  We will need to monitor his weight.  If he finds that his weight is decreasing the medication may need to be reduced.  I reviewed potential side effects with the patient and his wife.  Also provided him with a handout.  He is advised that if his symptoms worsen or he develops new symptoms he should let us know.  I spent 15 minutes with the patient. 50% of this time was spent reviewing memory score.     Ward Givens, MSN, NP-C 05/31/2017, 12:52 PM [facility] [address], [place], [place] [address] [phone]

## 2017-10-19 IMAGING — CT CT ABD-PELV W/ CM
2 of 5 series · 16 of 46 positions shown, 18 images · IV contrast (Omni 300)
Comparison: CT 01/08/2015

CLINICAL DATA: Low abdominal pain and constipation

EXAM:
CT ABDOMEN AND PELVIS WITH CONTRAST
TECHNIQUE: Multidetector CT imaging of the abdomen and pelvis was performed
using the standard protocol following bolus administration of
intravenous contrast.
CONTRAST:  100mL OMNIPAQUE IOHEXOL 300 MG/ML  SOLN

[Series 2: a/p w/ 5mm · axial · 0.68mm/px · z∈[-788,-338]mm · 13 of 101 slices shown, 15 images]
[im 6/101  soft-tissue]
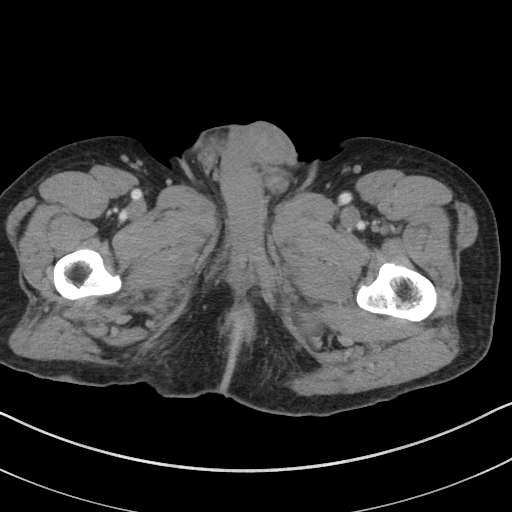
[im 6/101  bone]
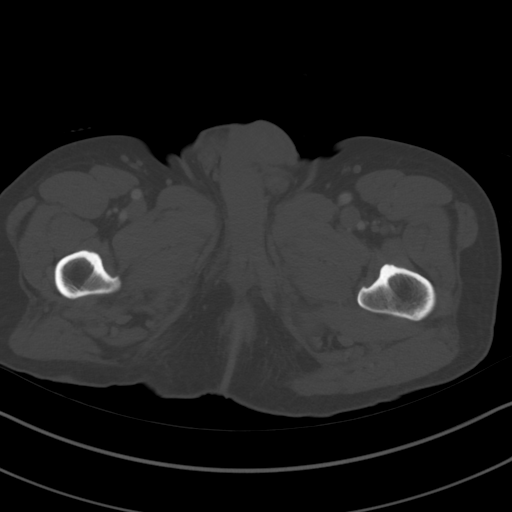
[im 16/101  soft-tissue]
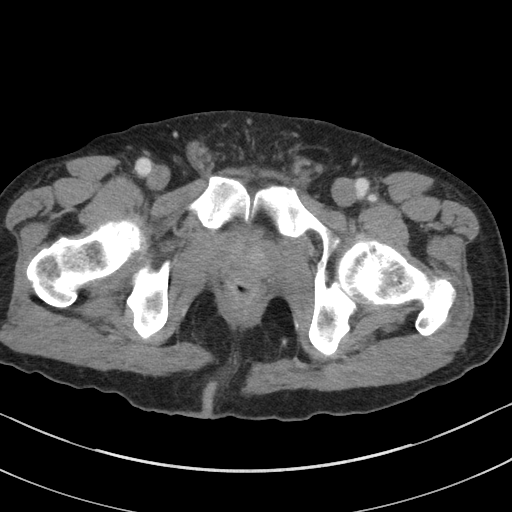
[im 21/101  soft-tissue]
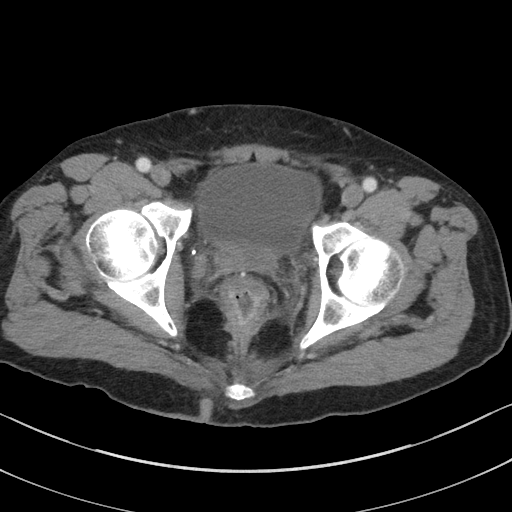
[im 31/101  soft-tissue]
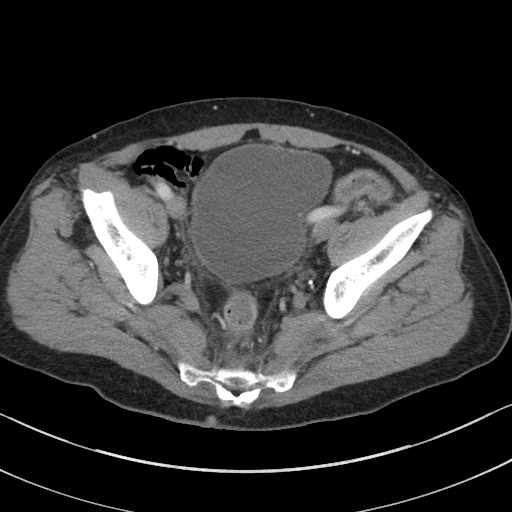
[im 36/101  soft-tissue]
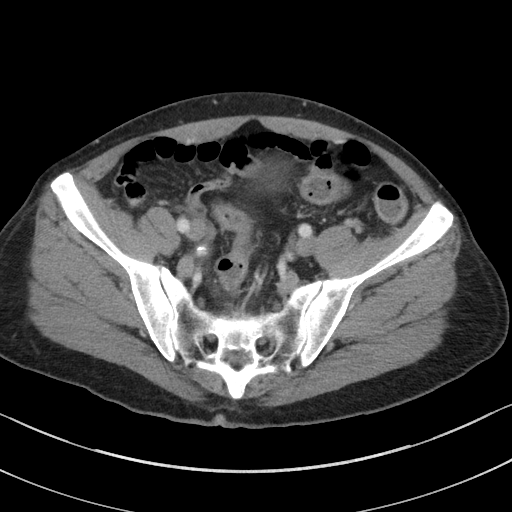
[im 46/101  soft-tissue]
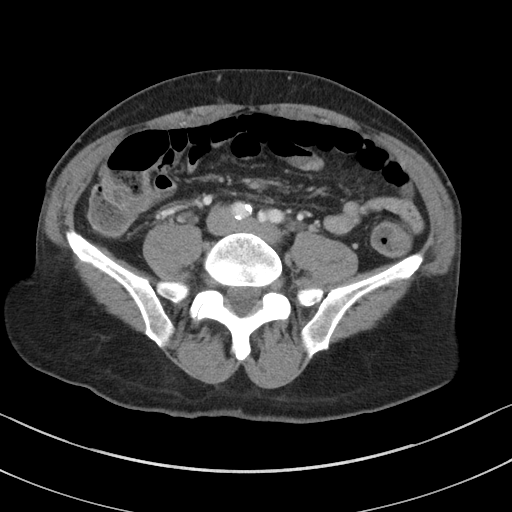
[im 51/101  soft-tissue]
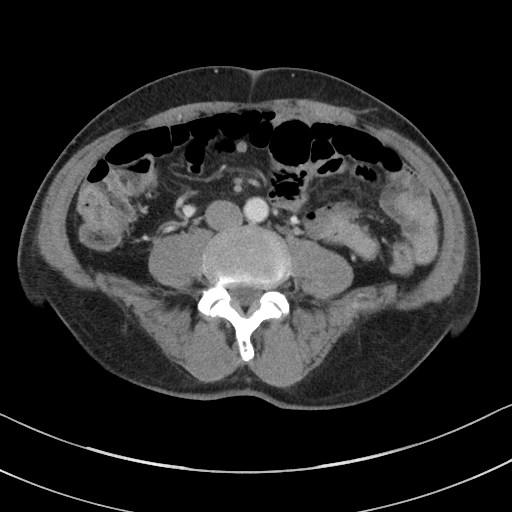
[im 56/101  soft-tissue]
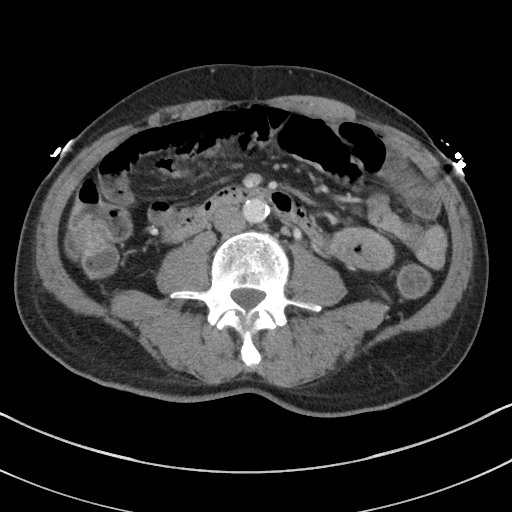
[im 66/101  soft-tissue]
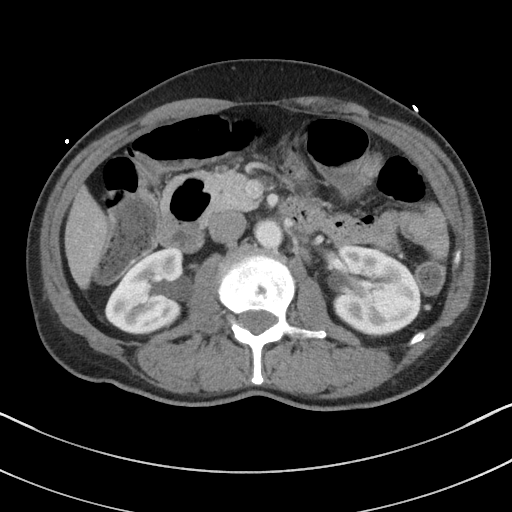
[im 66/101  bone]
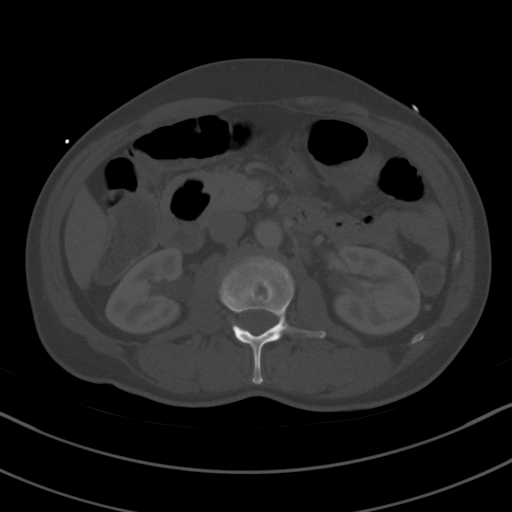
[im 71/101  soft-tissue]
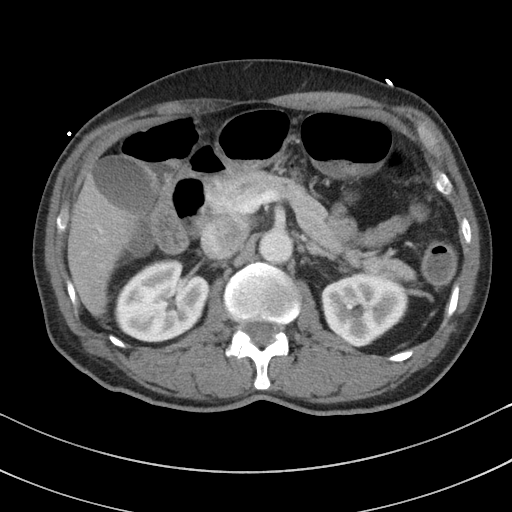
[im 81/101  soft-tissue]
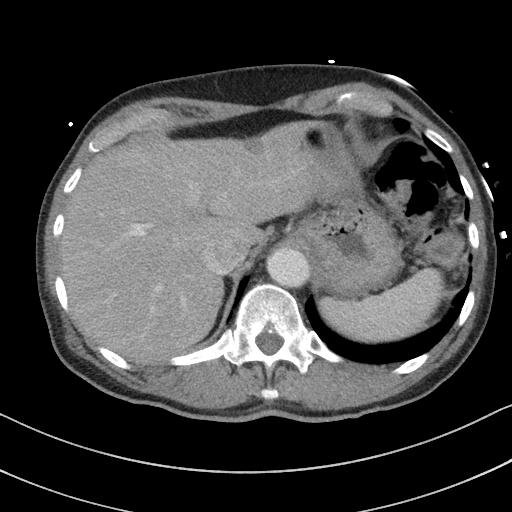
[im 86/101  soft-tissue]
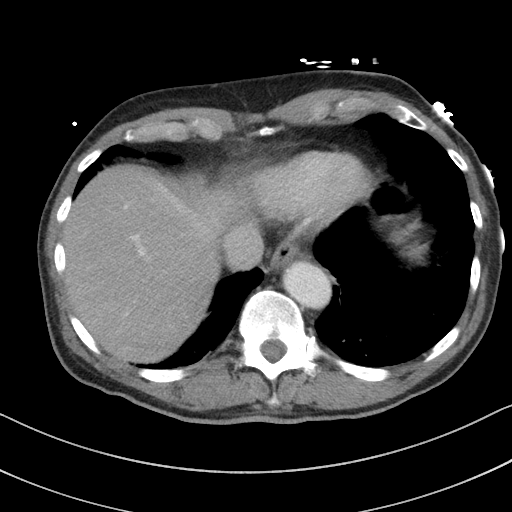
[im 96/101  soft-tissue]
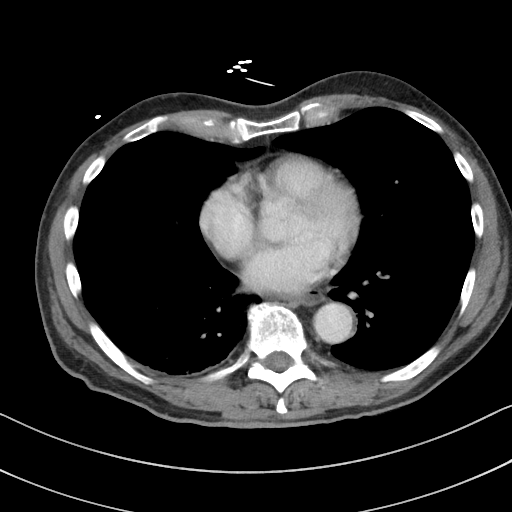

[Series 5: a/p w/ cor · coronal · 0.75mm/px · 3 of 114 slices shown]
[im 38/114  soft-tissue]
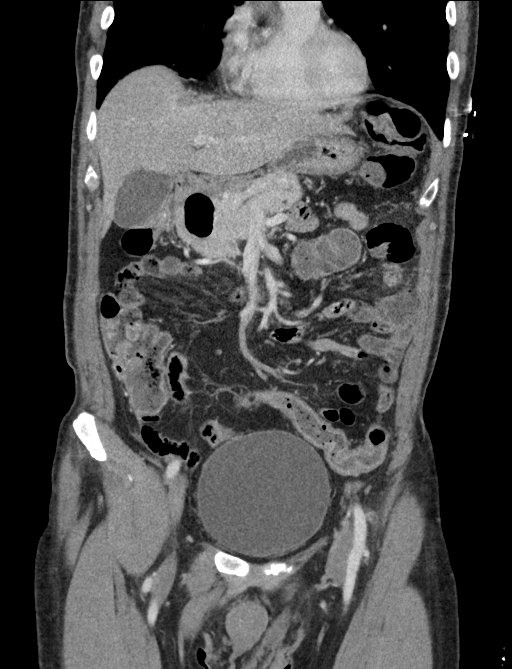
[im 51/114  soft-tissue]
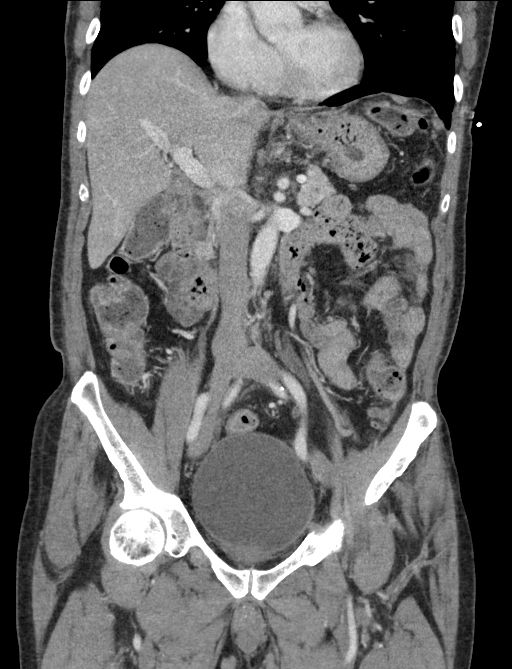
[im 63/114  soft-tissue]
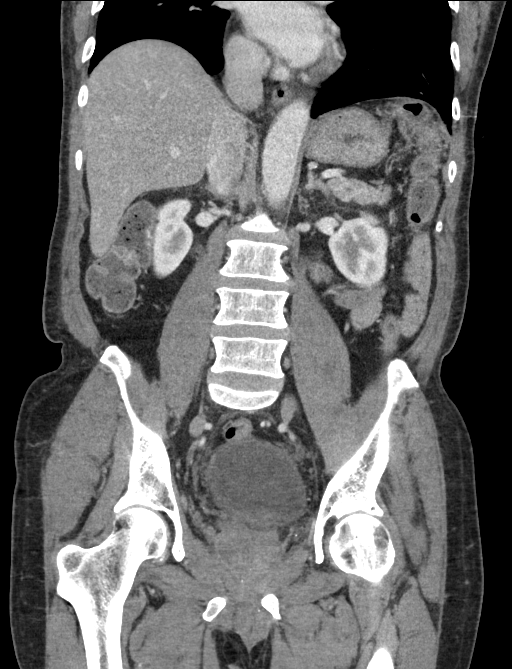

[16 of 46 positions shown; findings below may reference images not displayed]

FINDINGS: Lower chest: Lung bases are clear.

Hepatobiliary: The liver is normal. No biliary duct dilatation. The
gallbladder is mildly distended 3 cm. No inflammation. Common bile
duct normal.

Pancreas: Pancreas is normal. No ductal dilatation. No pancreatic
inflammation.

Spleen: Normal spleen

Adrenals/urinary tract: Adrenal glands normal. No ureterolithiasis
or obstructive uropathy. Low-density lesion lower pole of the LEFT
kidney is unchanged. Normal parenchymal enhancement.

Stomach/Bowel: Stomach, duodenum, small bowel are normal. Appendix
contains several appendicoliths (image 41, series 5) but no
dilatation or inflammation.

There is fluid stool through the colon. Several diverticula
descending colon without acute inflammation.

The distal 5 to 6 cm of the rectum demonstrates sub mucosal edema.
Perirectal fat stranding and small amount fluid in the presacral
space. No evidence of perforation or abscess. (findings evident on
images 77-84 of series 2).

Vascular/Lymphatic: Abdominal aorta is normal caliber. There is no
retroperitoneal or periportal lymphadenopathy. No pelvic
lymphadenopathy.

Reproductive: Prostate normal

Other: No significant ventral hernia

Musculoskeletal: No aggressive osseous lesion.
IMPRESSION: 1. A 6 cm segment of submucosal enhancement and perirectal
inflammation involving the distal rectum to the anus. Findings are
suggestive proctitis. Inflammatory bowel disease would be a
secondary consideration.
2. No evidence of perforation or abscess in the pelvis.

## 2017-11-30 ENCOUNTER — Telehealth: Payer: Self-pay

## 2017-11-30 ENCOUNTER — Ambulatory Visit: Payer: Medicare Other | Admitting: Adult Health

## 2017-11-30 ENCOUNTER — Encounter

## 2017-11-30 NOTE — Telephone Encounter (Signed)
Patient no showed for visit on 11/30/2017 with Hedwig Morton, NP. MB RN.

## 2017-12-01 ENCOUNTER — Ambulatory Visit: Payer: Medicare Other | Admitting: Adult Health

## 2017-12-01 ENCOUNTER — Encounter: Payer: Self-pay | Admitting: Adult Health

## 2017-12-01 VITALS — BP 118/83 | HR 70 | Ht 69.5 in | Wt 148.0 lb

## 2017-12-01 DIAGNOSIS — R413 Other amnesia: Secondary | ICD-10-CM

## 2017-12-01 NOTE — Progress Notes (Signed)
PATIENT: Tommy Browning. DOB: [birth year]  REASON FOR VISIT: follow up HISTORY FROM: patient  HISTORY OF PRESENT ILLNESS: Today 12/01/17  Tommy Browning is an 84 year old male with a history of memory disturbance.  He returns today for follow-up.  He remains on Aricept 10 mg at bedtime.  The patient has lost approximately 8 pounds since his visit in March.  His wife reports that he does have a decreased appetite.  He continues to live at home with his wife.  He is able to complete all ADLs independently.  He operates a Teacher, music without difficulty.  He manages his own finances.  His wife reports that he is more irritable than before.  Patient denies hallucinations.  Denies any trouble sleeping.  He returns today for an evaluation.  HISTORY 05/31/17 Tommy Browning is an 84 year old male with a history of memory disturbance.  He returns today for follow-up.  He is currently on Aricept 5 mg at bedtime.  He reports that his weight has been stable.  He does not feel that there is been a significant change in his memory.  He continues to live at home with his wife.  He is able to complete all ADLs independently.  Denies any trouble sleeping.  Reports that his appetite is up and down.  His wife states that his mood is better.  He denies any new neurological symptoms.  Returns today for evaluation.  REVIEW OF SYSTEMS: Out of a complete 14 system review of symptoms, the patient complains only of the following symptoms, and all other reviewed systems are negative.  See HPI  ALLERGIES: No Known Allergies  HOME MEDICATIONS: Outpatient Medications Prior to Visit  Medication Sig Dispense Refill  . aspirin 81 MG tablet Take 81 mg by mouth daily.      . carboxymethylcellulose (REFRESH PLUS) 0.5 % SOLN Place 1 drop into both eyes 4 (four) times daily - after meals and at bedtime.    . cyanocobalamin 1000 MCG tablet Take 1,000 mcg by mouth daily.     Marland Kitchen donepezil (ARICEPT) 10 MG tablet Take 1 tablet (10 mg total)  by mouth at bedtime. 30 tablet 11  . dorzolamide-timolol (COSOPT) 22.3-6.8 MG/ML ophthalmic solution Place 1 drop into both eyes every 12 (twelve) hours.     . fish oil-omega-3 fatty acids 1000 MG capsule Take 2 g by mouth daily.      . Multiple Vitamin (MULTIVITAMIN) tablet Take 1 tablet by mouth daily.       No facility-administered medications prior to visit.     PAST MEDICAL HISTORY: Past Medical History:  Diagnosis Date  . BACK PAIN 02/24/2008  . Diplopia   . Diverticulosis   . ELECTROCARDIOGRAM, ABNORMAL 12/08/2006   F/U by a normal stress test on September 2005  . ERECTILE DYSFUNCTION, ORGANIC 05/30/2009  . History of thyroid nodule    Resolved by [place] 11/2006  . [place], MILD 05/30/2009  . HYPERTHYROIDISM 12/08/2006   [place]  . HYPERTROPHY PROSTATE W/UR OBST & OTH LUTS 02/26/2009  . HYPOGONADISM 02/03/2010  . Ischemic colitis ([place])   . Tubular adenoma of colon 2004    PAST SURGICAL HISTORY: Past Surgical History:  Procedure Laterality Date  . COLONOSCOPY    . FLEXIBLE SIGMOIDOSCOPY N/A 03/08/2015   Procedure: FLEXIBLE SIGMOIDOSCOPY;  Surgeon: Ladene Artist, MD;  Location: [facility] ENDOSCOPY;  Service: Endoscopy;  Laterality: N/A;    FAMILY HISTORY: Family History  Problem Relation Age of Onset  . Colon cancer Sister  60  . Stroke Other        GP  . Diabetes Neg Hx   . Thyroid disease Neg Hx        no goiter or other thyroid problem  . Stomach cancer Neg Hx   . Prostate cancer Neg Hx     SOCIAL HISTORY: Social History   Socioeconomic History  . Marital status: Married    Spouse name: Tommy Browning  . Number of children: 2  . Years of education: 33  . Highest education level: Not on file  Occupational History  . 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 Financial resource strain: Not on file  . Food insecurity:    Worry: Not on file    Inability: Not on file  . Transportation needs:     Medical: Not on file    Non-medical: Not on file  Tobacco Use  . Smoking status: Former Smoker    Types: Cigarettes  . Smokeless tobacco: Never Used  . Tobacco comment: 1-2 cigarettes per day  Substance and Sexual Activity  . Alcohol use: Yes    Comment: very rare  . Drug use: No  . Sexual activity: Not on file    Comment: casual smoker  Lifestyle  . Physical activity:    Days per week: Not on file    Minutes per session: Not on file  . Stress: Not on file  Relationships  . Social connections:    Talks on phone: Not on file    Gets together: Not on file    Attends religious service: Not on file    Active member of club or organization: Not on file    Attends meetings of clubs or organizations: Not on file    Relationship status: Not on file  . Intimate partner violence:    Fear of current or ex partner: Not on file    Emotionally abused: Not on file    Physically abused: Not on file    Forced sexual activity: Not on file  Other Topics Concern  . 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   MMSE - Mini Mental State Exam 12/01/2017 05/31/2017 02/23/2017  Orientation to time 4 4 4   Orientation to Place 5 4 5   Registration 3 3 3   Attention/ Calculation 4 3 4   Recall 0 0 0  Language- name 2 objects 2 2 2   Language- repeat 1 0 1  Language- follow 3 step command 3 3 3   Language- read & follow direction 1 0 1  Write a sentence 1 0 1  Copy design 0 1 1  Total score 24 20 25      Generalized: Well developed, in no acute distress   Neurological examination  Mentation: Alert oriented to time, place, history taking. Follows all commands speech and language  fluent Cranial nerve II-XII: Pupils were equal round reactive to light. Extraocular movements were full, visual field were full on  confrontational test. Facial sensation and strength were normal. Uvula tongue midline. Head turning and shoulder shrug  were normal and symmetric. Motor: The motor testing reveals 5 over 5 strength of all 4 extremities. Good symmetric motor tone is noted throughout.  Sensory: Sensory testing is intact to soft touch on all 4 extremities. No evidence of extinction is noted.  Coordination: Cerebellar testing reveals good finger-nose-finger and heel-to-shin bilaterally.  Gait and station: Gait is normal.  Reflexes: Deep tendon reflexes are symmetric and normal bilaterally.   DIAGNOSTIC DATA (LABS, IMAGING, TESTING) - I reviewed patient records, labs, notes, testing and imaging myself where available.  Lab Results  Component Value Date   WBC 5.0 03/12/2017   HGB 14.4 03/12/2017   HCT 43.6 03/12/2017   MCV 91.8 03/12/2017   PLT 175.0 03/12/2017      Component Value Date/Time   NA 138 03/12/2017 1515   K 4.3 03/12/2017 1515   CL 102 03/12/2017 1515   CO2 31 03/12/2017 1515   GLUCOSE 70 03/12/2017 1515   BUN 15 03/12/2017 1515   CREATININE 1.14 03/12/2017 1515   CALCIUM 9.3 03/12/2017 1515   PROT 7.1 03/12/2017 1515   ALBUMIN 4.1 03/12/2017 1515   AST 23 03/12/2017 1515   ALT 12 03/12/2017 1515   ALKPHOS 68 03/12/2017 1515   BILITOT 0.7 03/12/2017 1515   GFRNONAA >60 03/09/2015 0311   GFRAA >60 03/09/2015 0311   Lab Results  Component Value Date   CHOL 240 (H) 03/12/2017   HDL 57.40 03/12/2017   LDLCALC 148 (H) 01/01/2016   LDLDIRECT 163.0 03/12/2017   TRIG 224.0 (H) 03/12/2017   CHOLHDL 4 03/12/2017   No results found for: HGBA1C Lab Results  Component Value Date   VITAMINB12 1,031 (H) 04/30/2016   Lab Results  Component Value Date   TSH 0.78 04/30/2016      ASSESSMENT AND PLAN 84 y.o. year old male  has a past medical history of BACK  PAIN (02/24/2008), Diplopia, Diverticulosis, ELECTROCARDIOGRAM, ABNORMAL (12/08/2006), ERECTILE DYSFUNCTION, ORGANIC (05/30/2009), History of thyroid nodule, HYPERLIPIDEMIA, MILD (05/30/2009), HYPERTHYROIDISM (12/08/2006), HYPERTROPHY PROSTATE W/UR OBST & OTH LUTS (02/26/2009), HYPOGONADISM (02/03/2010), Ischemic colitis ([place]), and Tubular adenoma of colon (2004). here with:  1. Memory disturbance  The patient's memory score has remained stable.  He will continue on Aricept 10 mg at bedtime.  I did advise that he needs to monitor his weight for the next 3 months.  I will call in 3 months if his weight has continued to decrease we will decrease his Aricept dose and add on Namenda.  Patient voiced understanding.  He will follow-up in 6 months or sooner if needed.   I spent 15 minutes with the patient. 50% of this time was spent reviewing his plan of care.   Ward Givens, MSN, NP-C 12/01/2017, 9:30 AM [facility] [address] [place], [place] [address] [phone]

## 2017-12-01 NOTE — Patient Instructions (Signed)
Your Plan:  Continue Aricept Monitor weight Will call in 3 months to check on weight In the future we may need to add namenda If your symptoms worsen or you develop new symptoms please let us know.    Thank you for coming to see [place] at [facility]. I hope we have been able to provide you high quality care today.  You may receive a patient satisfaction survey over the next few weeks. We would appreciate your feedback and comments so that we may continue to improve ourselves and the health of our patients.  Memantine Tablets What is this medicine? MEMANTINE (MEM an teen) is used to treat dementia caused by Alzheimer's disease. This medicine may be used for other purposes; ask your health care provider or pharmacist if you have questions. COMMON BRAND NAME(S): Namenda What should I tell my health care provider before I take this medicine? They need to know if you have any of these conditions: -difficulty passing urine -kidney disease -liver disease -seizures -an unusual or allergic reaction to memantine, other medicines, foods, dyes, or preservatives -pregnant or trying to get pregnant -breast-feeding How should I use this medicine? Take this medicine by mouth with a glass of water. Follow the directions on the prescription label. You may take this medicine with or without food. Take your doses at regular intervals. Do not take your medicine more often than directed. Continue to take your medicine even if you feel better. Do not stop taking except on the advice of your doctor or health care professional. Talk to your pediatrician regarding the use of this medicine in children. Special care may be needed. Overdosage: If you think you have taken too much of this medicine contact a poison control center or emergency room at once. NOTE: This medicine is only for you. Do not share this medicine with others. What if I miss a dose? If you miss a dose, take it as soon as you  can. If it is almost time for your next dose, take only that dose. Do not take double or extra doses. If you do not take your medicine for several days, contact your health care provider. Your dose may need to be changed. What may interact with this medicine? -acetazolamide -amantadine -cimetidine -dextromethorphan -dofetilide -hydrochlorothiazide -ketamine -metformin -methazolamide -quinidine -ranitidine -sodium bicarbonate -triamterene This list may not describe all possible interactions. Give your health care provider a list of all the medicines, herbs, non-prescription drugs, or dietary supplements you use. Also tell them if you smoke, drink alcohol, or use illegal drugs. Some items may interact with your medicine. What should I watch for while using this medicine? Visit your doctor or health care professional for regular checks on your progress. Check with your doctor or health care professional if there is no improvement in your symptoms or if they get worse. You may get drowsy or dizzy. Do not drive, use machinery, or do anything that needs mental alertness until you know how this drug affects you. Do not stand or sit up quickly, especially if you are an older patient. This reduces the risk of dizzy or fainting spells. Alcohol can make you more drowsy and dizzy. Avoid alcoholic drinks. What side effects may I notice from receiving this medicine? Side effects that you should report to your doctor or health care professional as soon as possible: -allergic reactions like skin rash, itching or hives, swelling of the face, lips, or tongue -agitation or a feeling of restlessness -depressed mood -dizziness -  hallucinations -redness, blistering, peeling or loosening of the skin, including inside the mouth -seizures -vomiting Side effects that usually do not require medical attention (report to your doctor or health care professional if they continue or are  bothersome): -constipation -diarrhea -headache -nausea -trouble sleeping This list may not describe all possible side effects. Call your doctor for medical advice about side effects. You may report side effects to FDA at 1-800-FDA-1088. Where should I keep my medicine? Keep out of the reach of children. Store at room temperature between 15 degrees and 30 degrees C (59 degrees and 86 degrees F). Throw away any unused medicine after the expiration date. NOTE: This sheet is a summary. It may not cover all possible information. If you have questions about this medicine, talk to your doctor, pharmacist, or health care provider.  2018 Elsevier/Gold Standard (2013-01-02 14:10:42)

## 2017-12-14 LAB — PSA: PSA: 12.4

## 2017-12-29 ENCOUNTER — Encounter: Payer: Self-pay | Admitting: Internal Medicine

## 2018-02-08 ENCOUNTER — Ambulatory Visit: Payer: Medicare Other | Admitting: Internal Medicine

## 2018-02-08 ENCOUNTER — Encounter: Payer: Self-pay | Admitting: Internal Medicine

## 2018-02-08 DIAGNOSIS — R05 Cough: Secondary | ICD-10-CM | POA: Diagnosis not present

## 2018-02-08 DIAGNOSIS — R059 Cough, unspecified: Secondary | ICD-10-CM

## 2018-02-08 MED ORDER — ZOSTER VAC RECOMB ADJUVANTED 50 MCG/0.5ML IM SUSR: 0.5000 mL | Freq: Once | INTRAMUSCULAR | 1 refills | Status: AC

## 2018-02-08 MED ORDER — DOXYCYCLINE HYCLATE 100 MG PO TABS: 100.0000 mg | ORAL_TABLET | Freq: Two times a day (BID) | ORAL | 0 refills | Status: DC

## 2018-02-08 NOTE — Patient Instructions (Signed)
We have sent in doxycycline to take 1 pill twice a day.

## 2018-02-08 NOTE — Progress Notes (Signed)
   Subjective:    Patient ID: Tommy Luke., male    DOB: [birth year], 84 y.o.   MRN: [id number]  HPI The patient is an 84 YO man coming in for cough and low energy for about 2 weeks or so. Having productive cough with clear sputum. Taking mucinex over the counter which is not helping. Denies fevers or chills. Denies sinus pressure or pain. Some nasal drainage. Some SOB. Overall stable to mild worsening.   Review of Systems  Constitutional: Positive for activity change, appetite change, chills and fatigue. Negative for fever and unexpected weight change.  HENT: Positive for congestion, postnasal drip, rhinorrhea and sinus pressure. Negative for ear discharge, ear pain, sinus pain, sneezing, sore throat, tinnitus, trouble swallowing and voice change.   Eyes: Negative.   Respiratory: Positive for cough and shortness of breath. Negative for chest tightness and wheezing.   Cardiovascular: Negative.   Gastrointestinal: Negative.   Musculoskeletal: Positive for myalgias.  Neurological: Negative.       Objective:   Physical Exam  Constitutional: He is oriented to person, place, and time. He appears well-developed and well-nourished.  HENT:  Head: Normocephalic and atraumatic.  Oropharynx with redness and clear drainage, nose with swollen turbinates, TMs normal bilaterally  Eyes: EOM are normal.  Neck: Normal range of motion. No thyromegaly present.  Cardiovascular: Normal rate and regular rhythm.  Pulmonary/Chest: Effort normal. No respiratory distress. He has no wheezes. He has no rales.  Mild rhonchi which mostly clear with coughing  Abdominal: Soft.  Lymphadenopathy:    He has no cervical adenopathy.  Neurological: He is alert and oriented to person, place, and time.  Skin: Skin is warm and dry.   Vitals:   02/08/18 1601  BP: 124/82  Pulse: 70  Temp: 97.8 F (36.6 C)  TempSrc: Oral  SpO2: 96%  Weight: 152 lb (68.9 kg)  Height: 5' 9.5" (1.765 m)      Assessment & Plan:

## 2018-02-09 DIAGNOSIS — R059 Cough, unspecified: Secondary | ICD-10-CM | POA: Insufficient documentation

## 2018-02-09 DIAGNOSIS — R05 Cough: Secondary | ICD-10-CM | POA: Insufficient documentation

## 2018-02-09 NOTE — Assessment & Plan Note (Signed)
Rx for doxycycline given time course and symptoms.

## 2018-02-28 ENCOUNTER — Ambulatory Visit: Payer: Medicare Other

## 2018-03-07 NOTE — Progress Notes (Deleted)
Subjective:   Tommy Browning. is a 84 y.o. male who presents for Medicare Annual/Subsequent preventive examination.  Review of Systems:  No ROS.  Medicare Wellness Visit. Additional risk factors are reflected in the social history.    Sleep patterns: {SX; SLEEP PATTERNS:18802::"feels rested on waking","does not get up to void","gets up *** times nightly to void","sleeps *** hours nightly"}.    Home Safety/Smoke Alarms: Feels safe in home. Smoke alarms in place.  Living environment; residence and Firearm Safety: {Rehab home environment / accessibility:30080::"no firearms","firearms stored safely"}. Seat Belt Safety/Bike Helmet: Wears seat belt.   PSA-  Lab Results  Component Value Date   PSA 12.40 12/14/2017   PSA 3.13 05/30/2009   PSA 22.94 (H) 02/26/2009       Objective:    Vitals: There were no vitals taken for this visit.  There is no height or weight on file to calculate BMI.  Advanced Directives 02/23/2017 03/08/2015 03/07/2015 01/08/2015 01/07/2015  Does Patient Have a Medical Advance Directive? No No No No No  Would patient like information on creating a medical advance directive? No - Patient declined - - No - patient declined information Yes - Scientist, clinical (histocompatibility and immunogenetics) given    Tobacco Social History   Tobacco Use  Smoking Status Former Smoker  . Types: Cigarettes  Smokeless Tobacco Never Used  Tobacco Comment   1-2 cigarettes per day     Counseling given: Not Answered Comment: 1-2 cigarettes per day  Past Medical History:  Diagnosis Date  . BACK PAIN 02/24/2008  . Diplopia   . Diverticulosis   . ELECTROCARDIOGRAM, ABNORMAL 12/08/2006   F/U by a normal stress test on September 2005  . ERECTILE DYSFUNCTION, ORGANIC 05/30/2009  . History of thyroid nodule    Resolved by [place] 11/2006  . [place], MILD 05/30/2009  . HYPERTHYROIDISM 12/08/2006   [place]  . HYPERTROPHY PROSTATE W/UR OBST & OTH LUTS 02/26/2009  . HYPOGONADISM 02/03/2010  . Ischemic colitis  ([place])   . Tubular adenoma of colon 2004   Past Surgical History:  Procedure Laterality Date  . COLONOSCOPY    . FLEXIBLE SIGMOIDOSCOPY N/A 03/08/2015   Procedure: FLEXIBLE SIGMOIDOSCOPY;  Surgeon: Ladene Artist, MD;  Location: [facility] ENDOSCOPY;  Service: Endoscopy;  Laterality: N/A;   Family History  Problem Relation Age of Onset  . Colon cancer Sister 42  . Stroke Other        GP  . Diabetes Neg Hx   . Thyroid disease Neg Hx        no goiter or other thyroid problem  . Stomach cancer Neg Hx   . Prostate cancer Neg Hx    Social History   Socioeconomic History  . Marital status: Married    Spouse name: [name]  . Number of children: 2  . Years of education: 84  . Highest education level: Not on file  Occupational History  . 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 Financial resource strain: Not on file  . Food insecurity:    Worry: Not on file    Inability: Not on file  . Transportation needs:    Medical: Not on file    Non-medical: Not on file  Tobacco Use  . Smoking status: Former Smoker    Types: Cigarettes  . Smokeless tobacco: Never Used  . Tobacco comment: 1-2 cigarettes per day  Substance and Sexual  Activity  . Alcohol use: Yes    Comment: very rare  . Drug use: No  . Sexual activity: Not on file    Comment: casual smoker  Lifestyle  . Physical activity:    Days per week: Not on file    Minutes per session: Not on file  . Stress: Not on file  Relationships  . Social connections:    Talks on phone: Not on file    Gets together: Not on file    Attends religious service: Not on file    Active member of club or organization: Not on file    Attends meetings of clubs or organizations: Not on file    Relationship status: Not on file  Other Topics Concern  . 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 aspirin 81 MG tablet Take 81 mg by mouth daily.    . carboxymethylcellulose (REFRESH PLUS) 0.5 % SOLN Place 1 drop into both eyes 4 (four) times daily - after meals and at bedtime.  . cyanocobalamin 1000 MCG tablet Take 1,000 mcg by mouth daily.   Marland Kitchen donepezil (ARICEPT) 10 MG tablet Take 1 tablet (10 mg total) by mouth at bedtime.  . dorzolamide-timolol (COSOPT) 22.3-6.8 MG/ML ophthalmic solution Place 1 drop into both eyes every 12 (twelve) hours.   Marland Kitchen doxycycline (VIBRA-TABS) 100 MG tablet Take 1 tablet (100 mg total) by mouth 2 (two) times daily.  . fish oil-omega-3 fatty acids 1000 MG capsule Take 2 g by mouth daily.    . Multiple Vitamin (MULTIVITAMIN) tablet Take 1 tablet by mouth daily.     No facility-administered encounter medications on file as of 03/08/2018.     Activities of Daily Living No flowsheet data found.  Patient Care Team: Hoyt Koch, MD as PCP - General (Internal Medicine) Meryl Crutch Vickki Muff, MD as Referring Physician (Ophthalmology) Arta Bruce, Asher as Referring Physician (Ophthalmology) Kathrynn Ducking, MD as Consulting Physician (Neurology) Renato Shin, MD as Consulting Physician (Endocrinology)   Assessment:   This is a routine wellness examination for Tommy Browning. Physical assessment deferred to PCP.   Exercise Activities and Dietary recommendations   Diet (meal preparation, eat out, water intake, caffeinated beverages, dairy products, fruits and vegetables): {Desc; diets:16563}   Goals    . 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       Fall Risk Fall Risk  02/23/2017 12/01/2016 03/06/2016 03/20/2015 12/28/2012  Falls in the past year? No No  No No No  Comment - - Emmi Telephone Survey: data to providers prior to load - -  Risk for fall due to : Impaired vision;Impaired balance/gait - - - -    Depression Screen PHQ 2/9 Scores 02/23/2017 03/20/2015 12/28/2012  PHQ - 2 Score 2 2 0  PHQ- 9 Score 6 11 -    Cognitive Function MMSE - Mini Mental State Exam 12/01/2017 05/31/2017 02/23/2017 12/01/2016 05/28/2016  Orientation to time 4 4 4 4 5   Orientation to Place 5 4 5  5  4  Registration 3 3 3 3 3   Attention/ Calculation 4 3 4 1 5   Recall 0 0 0 2 1  Language- name 2 objects 2 2 2 2 2   Language- repeat 1 0 1 1 1   Language- follow 3 step command 3 3 3 3 3   Language- read & follow direction 1 0 1 1 1   Write a sentence 1 0 1 1 1   Copy design 0 1 1 1 1   Total score 24 20 25 24 27         Immunization History  Administered Date(s) Administered  . Influenza Split 01/27/2011  . Influenza Whole 02/26/2009  . Influenza, High Dose Seasonal PF 01/01/2016, 03/12/2017  . Influenza,inj,Quad PF,6+ Mos 12/31/2014  . Influenza-Unspecified 12/28/2013, 01/14/2014  . Pneumococcal Conjugate-13 12/31/2014  . Pneumococcal Polysaccharide-23 06/28/2005  . Td 04/30/2005  . Tdap 01/01/2016   Screening Tests Health Maintenance  Topic Date Due  . INFLUENZA VACCINE  06/29/2018 (Originally 10/28/2017)  . TETANUS/TDAP  12/31/2025  . PNA vac Low Risk Adult  Completed      Plan:     I have personally reviewed and noted the following in the patient's chart:   . Medical and social history . Use of alcohol, tobacco or illicit drugs  . Current medications and supplements . Functional ability and status . Nutritional status . Physical activity . Advanced directives . List of other physicians . Vitals . Screenings to include cognitive, depression, and falls . Referrals and appointments  In addition, I have reviewed and discussed with patient certain preventive protocols, quality metrics, and best practice recommendations. A written personalized care  plan for preventive services as well as general preventive health recommendations were provided to patient.     Michiel Cowboy, RN  03/07/2018

## 2018-03-08 ENCOUNTER — Ambulatory Visit: Payer: Medicare Other

## 2018-03-11 NOTE — Progress Notes (Deleted)
Subjective:   Tommy Browning. is a 84 y.o. male who presents for Medicare Annual/Subsequent preventive examination.  Review of Systems:  No ROS.  Medicare Wellness Visit. Additional risk factors are reflected in the social history.    Sleep patterns: {SX; SLEEP PATTERNS:18802::"feels rested on waking","does not get up to void","gets up *** times nightly to void","sleeps *** hours nightly"}.   Home Safety/Smoke Alarms: Feels safe in home. Smoke alarms in place.  Living environment; residence and Firearm Safety: {Rehab home environment / accessibility:30080::"no firearms","firearms stored safely"}. Seat Belt Safety/Bike Helmet: Wears seat belt.   PSA-  Lab Results  Component Value Date   PSA 12.40 12/14/2017   PSA 3.13 05/30/2009   PSA 22.94 (H) 02/26/2009       Objective:    Vitals: There were no vitals taken for this visit.  There is no height or weight on file to calculate BMI.  Advanced Directives 02/23/2017 03/08/2015 03/07/2015 01/08/2015 01/07/2015  Does Patient Have a Medical Advance Directive? No No No No No  Would patient like information on creating a medical advance directive? No - Patient declined - - No - patient declined information Yes - Scientist, clinical (histocompatibility and immunogenetics) given    Tobacco Social History   Tobacco Use  Smoking Status Former Smoker  . Types: Cigarettes  Smokeless Tobacco Never Used  Tobacco Comment   1-2 cigarettes per day     Counseling given: Not Answered Comment: 1-2 cigarettes per day  Past Medical History:  Diagnosis Date  . BACK PAIN 02/24/2008  . Diplopia   . Diverticulosis   . ELECTROCARDIOGRAM, ABNORMAL 12/08/2006   F/U by a normal stress test on September 2005  . ERECTILE DYSFUNCTION, ORGANIC 05/30/2009  . History of thyroid nodule    Resolved by [place] 11/2006  . [place], MILD 05/30/2009  . HYPERTHYROIDISM 12/08/2006   [place]  . HYPERTROPHY PROSTATE W/UR OBST & OTH LUTS 02/26/2009  . HYPOGONADISM 02/03/2010  . Ischemic colitis ([place])    . Tubular adenoma of colon 2004   Past Surgical History:  Procedure Laterality Date  . COLONOSCOPY    . FLEXIBLE SIGMOIDOSCOPY N/A 03/08/2015   Procedure: FLEXIBLE SIGMOIDOSCOPY;  Surgeon: Ladene Artist, MD;  Location: [facility] ENDOSCOPY;  Service: Endoscopy;  Laterality: N/A;   Family History  Problem Relation Age of Onset  . Colon cancer Sister 40  . Stroke Other        GP  . Diabetes Neg Hx   . Thyroid disease Neg Hx        no goiter or other thyroid problem  . Stomach cancer Neg Hx   . Prostate cancer Neg Hx    Social History   Socioeconomic History  . Marital status: Married    Spouse name: [name]  . Number of children: 2  . Years of education: 40  . Highest education level: Not on file  Occupational History  . 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 Financial resource strain: Not on file  . Food insecurity:    Worry: Not on file    Inability: Not on file  . Transportation needs:    Medical: Not on file    Non-medical: Not on file  Tobacco Use  . Smoking status: Former Smoker    Types: Cigarettes  . Smokeless tobacco: Never Used  . Tobacco comment: 1-2 cigarettes per day  Substance and Sexual Activity  .  Alcohol use: Yes    Comment: very rare  . Drug use: No  . Sexual activity: Not on file    Comment: casual smoker  Lifestyle  . Physical activity:    Days per week: Not on file    Minutes per session: Not on file  . Stress: Not on file  Relationships  . Social connections:    Talks on phone: Not on file    Gets together: Not on file    Attends religious service: Not on file    Active member of club or organization: Not on file    Attends meetings of clubs or organizations: Not on file    Relationship status: Not on file  Other Topics Concern  . 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 aspirin 81 MG tablet Take 81 mg by mouth daily.    . carboxymethylcellulose (REFRESH PLUS) 0.5 % SOLN Place 1 drop into both eyes 4 (four) times daily - after meals and at bedtime.  . cyanocobalamin 1000 MCG tablet Take 1,000 mcg by mouth daily.   Marland Kitchen donepezil (ARICEPT) 10 MG tablet Take 1 tablet (10 mg total) by mouth at bedtime.  . dorzolamide-timolol (COSOPT) 22.3-6.8 MG/ML ophthalmic solution Place 1 drop into both eyes every 12 (twelve) hours.   Marland Kitchen doxycycline (VIBRA-TABS) 100 MG tablet Take 1 tablet (100 mg total) by mouth 2 (two) times daily.  . fish oil-omega-3 fatty acids 1000 MG capsule Take 2 g by mouth daily.    . Multiple Vitamin (MULTIVITAMIN) tablet Take 1 tablet by mouth daily.     No facility-administered encounter medications on file as of 03/14/2018.     Activities of Daily Living No flowsheet data found.  Patient Care Team: Hoyt Koch, MD as PCP - General (Internal Medicine) Meryl Crutch Vickki Muff, MD as Referring Physician (Ophthalmology) Arta Bruce, [place] as Referring Physician (Ophthalmology) Kathrynn Ducking, MD as Consulting Physician (Neurology) Renato Shin, MD as Consulting Physician (Endocrinology)   Assessment:   This is a routine wellness examination for [place]. Physical assessment deferred to PCP.   Exercise Activities and Dietary recommendations   Diet (meal preparation, eat out, water intake, caffeinated beverages, dairy products, fruits and vegetables): {Desc; diets:16563}  Goals    . 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       Fall Risk Fall Risk  02/23/2017 12/01/2016 03/06/2016 03/20/2015 12/28/2012  Falls in the past year? No No No No No    Comment - - Emmi Telephone Survey: data to providers prior to load - -  Risk for fall due to : Impaired vision;Impaired balance/gait - - - -    Depression Screen PHQ 2/9 Scores 02/23/2017 03/20/2015 12/28/2012  PHQ - 2 Score 2 2 0  PHQ- 9 Score 6 11 -    Cognitive Function MMSE - Mini Mental State Exam 12/01/2017 05/31/2017 02/23/2017 12/01/2016 05/28/2016  Orientation to time 4 4 4 4 5   Orientation to Place 5 4 5 5 4   Registration  3 3 3 3 3   Attention/ Calculation 4 3 4 1 5   Recall 0 0 0 2 1  Language- name 2 objects 2 2 2 2 2   Language- repeat 1 0 1 1 1   Language- follow 3 step command 3 3 3 3 3   Language- read & follow direction 1 0 1 1 1   Write a sentence 1 0 1 1 1   Copy design 0 1 1 1 1   Total score 24 20 25 24 27         Immunization History  Administered Date(s) Administered  . Influenza Split 01/27/2011  . Influenza Whole 02/26/2009  . Influenza, High Dose Seasonal PF 01/01/2016, 03/12/2017  . Influenza,inj,Quad PF,6+ Mos 12/31/2014  . Influenza-Unspecified 12/28/2013, 01/14/2014  . Pneumococcal Conjugate-13 12/31/2014  . Pneumococcal Polysaccharide-23 06/28/2005  . Td 04/30/2005  . Tdap 01/01/2016   Screening Tests Health Maintenance  Topic Date Due  . INFLUENZA VACCINE  06/29/2018 (Originally 10/28/2017)  . TETANUS/TDAP  12/31/2025  . PNA vac Low Risk Adult  Completed        Plan:     I have personally reviewed and noted the following in the patient's chart:   . Medical and social history . Use of alcohol, tobacco or illicit drugs  . Current medications and supplements . Functional ability and status . Nutritional status . Physical activity . Advanced directives . List of other physicians . Vitals . Screenings to include cognitive, depression, and falls . Referrals and appointments  In addition, I have reviewed and discussed with patient certain preventive protocols, quality metrics, and best practice recommendations. A written personalized care plan  for preventive services as well as general preventive health recommendations were provided to patient.     Michiel Cowboy, RN  03/11/2018

## 2018-03-14 ENCOUNTER — Encounter: Payer: Medicare Other | Admitting: Internal Medicine

## 2018-03-14 ENCOUNTER — Ambulatory Visit: Payer: Medicare Other

## 2018-03-14 DIAGNOSIS — Z0289 Encounter for other administrative examinations: Secondary | ICD-10-CM

## 2018-03-18 ENCOUNTER — Encounter: Payer: Medicare Other | Admitting: Internal Medicine

## 2018-03-18 DIAGNOSIS — Z0289 Encounter for other administrative examinations: Secondary | ICD-10-CM

## 2018-04-07 ENCOUNTER — Encounter: Payer: Self-pay | Admitting: Internal Medicine

## 2018-04-07 ENCOUNTER — Ambulatory Visit (INDEPENDENT_AMBULATORY_CARE_PROVIDER_SITE_OTHER): Payer: Medicare Other | Admitting: Internal Medicine

## 2018-04-07 ENCOUNTER — Other Ambulatory Visit (INDEPENDENT_AMBULATORY_CARE_PROVIDER_SITE_OTHER): Payer: Medicare Other

## 2018-04-07 VITALS — BP 110/80 | HR 58 | Temp 97.7°F | Ht 69.5 in | Wt 145.0 lb

## 2018-04-07 DIAGNOSIS — E538 Deficiency of other specified B group vitamins: Secondary | ICD-10-CM

## 2018-04-07 DIAGNOSIS — R413 Other amnesia: Secondary | ICD-10-CM

## 2018-04-07 DIAGNOSIS — E785 Hyperlipidemia, unspecified: Secondary | ICD-10-CM

## 2018-04-07 DIAGNOSIS — R059 Cough, unspecified: Secondary | ICD-10-CM

## 2018-04-07 DIAGNOSIS — Z23 Encounter for immunization: Secondary | ICD-10-CM

## 2018-04-07 DIAGNOSIS — R05 Cough: Secondary | ICD-10-CM

## 2018-04-07 DIAGNOSIS — Z0000 Encounter for general adult medical examination without abnormal findings: Secondary | ICD-10-CM

## 2018-04-07 LAB — COMPREHENSIVE METABOLIC PANEL
ALBUMIN: 4.3 g/dL (ref 3.5–5.2)
ALT: 15 U/L (ref 0–53)
AST: 28 U/L (ref 0–37)
Alkaline Phosphatase: 59 U/L (ref 39–117)
BUN: 14 mg/dL (ref 6–23)
CALCIUM: 9.9 mg/dL (ref 8.4–10.5)
CHLORIDE: 101 meq/L (ref 96–112)
CO2: 28 meq/L (ref 19–32)
CREATININE: 1.07 mg/dL (ref 0.40–1.50)
GFR: 84.16 mL/min (ref >60.00)
Glucose, Bld: 79 mg/dL (ref 70–99)
POTASSIUM: 4.4 meq/L (ref 3.5–5.1)
Sodium: 137 mEq/L (ref 135–145)
Total Bilirubin: 1 mg/dL (ref 0.2–1.2)
Total Protein: 7.3 g/dL (ref 6.0–8.3)

## 2018-04-07 LAB — LIPID PANEL
CHOL/HDL RATIO: 4
CHOLESTEROL: 273 mg/dL — AB (ref 0–200)
HDL: 63 mg/dL (ref >39.00)
LDL CALC: 189 mg/dL — AB (ref 0–99)
NonHDL: 210.11
TRIGLYCERIDES: 108 mg/dL (ref 0.0–149.0)
VLDL: 21.6 mg/dL (ref 0.0–40.0)

## 2018-04-07 LAB — CBC
HCT: 43.4 % (ref 39.0–52.0)
Hemoglobin: 14.7 g/dL (ref 13.0–17.0)
MCHC: 33.8 g/dL (ref 30.0–36.0)
MCV: 90.7 fl (ref 78.0–100.0)
PLATELETS: 190 10*3/uL (ref 150.0–400.0)
RBC: 4.79 Mil/uL (ref 4.22–5.81)
RDW: 14 % (ref 11.5–15.5)
WBC: 3.4 10*3/uL — AB (ref 4.0–10.5)

## 2018-04-07 LAB — VITAMIN B12: VITAMIN B 12: 838 pg/mL (ref 211–911)

## 2018-04-07 NOTE — Assessment & Plan Note (Signed)
Flu shot given. Pneumonia complete. Shingrix counseled. Tetanus up to date. Colonoscopy aged out. Counseled about sun safety and mole surveillance. Counseled about the dangers of distracted driving. Given 10 year screening recommendations.

## 2018-04-07 NOTE — Progress Notes (Signed)
   Subjective:   Patient ID: Tommy Browning., male    DOB: [birth year], 85 y.o.   MRN: [id number]  HPI Here for medicare wellness and physical, no new complaints. Please see A/P for status and treatment of chronic medical problems.   Diet: heart healthy Physical activity: sedentary Depression/mood screen: negative, see score below Hearing: intact to whispered voice, mild loss bilaterally Visual acuity: grossly normal, performs annual eye exam  ADLs: capable Fall risk: none Home safety: good Cognitive evaluation: intact to orientation, naming, recall and repetition EOL planning: adv directives discussed    Office Visit from 04/07/2018 in [place]  PHQ-2 Total Score  1        Clinical Support from 02/23/2017 in [place]  PHQ-9 Total Score  6     I have personally reviewed and have noted 1. The patient's medical and social history - reviewed today no changes 2. Their use of alcohol, tobacco or illicit drugs 3. Their current medications and supplements 4. The patient's functional ability including ADL's, fall risks, home safety risks and hearing or visual impairment. 5. Diet and physical activities 6. Evidence for depression or mood disorders 7. Care team reviewed and updated (available in snapshot)  Review of Systems  Unable to perform ROS: Dementia  Constitutional: Negative.   HENT: Positive for congestion.   Eyes: Negative.   Respiratory: Positive for cough. Negative for chest tightness and shortness of breath.   Cardiovascular: Negative for chest pain, palpitations and leg swelling.  Gastrointestinal: Negative for abdominal distention, abdominal pain, constipation, diarrhea, nausea and vomiting.  Musculoskeletal: Negative.   Skin: Negative.   Neurological: Negative.   Psychiatric/Behavioral: Negative.     Objective:  Physical Exam Constitutional:      Appearance: He is well-developed.  HENT:     Head:  Normocephalic and atraumatic.     Comments: Oropharynx with redness and clear drainage, nose with swollen turbinates, TMs normal bilaterally.  Neck:     Musculoskeletal: Normal range of motion.     Thyroid: No thyromegaly.  Cardiovascular:     Rate and Rhythm: Normal rate and regular rhythm.  Pulmonary:     Effort: Pulmonary effort is normal. No respiratory distress.     Breath sounds: Normal breath sounds. No wheezing or rales.  Abdominal:     General: Bowel sounds are normal. There is no distension.     Palpations: Abdomen is soft.     Tenderness: There is no abdominal tenderness. There is no rebound.  Musculoskeletal:        General: No tenderness.  Lymphadenopathy:     Cervical: No cervical adenopathy.  Skin:    General: Skin is warm and dry.  Neurological:     Mental Status: He is alert and oriented to person, place, and time.     Coordination: Coordination normal.     Vitals:   04/07/18 1334  BP: 110/80  Pulse: (!) 58  Temp: 97.7 F (36.5 C)  TempSrc: Oral  SpO2: 99%  Weight: 145 lb (65.8 kg)  Height: 5' 9.5" (1.765 m)    Assessment & Plan:  Flu shot given at visit

## 2018-04-07 NOTE — Assessment & Plan Note (Signed)
Memory overall declining. On aricept 10 mg daily. Checking B12 level today.

## 2018-04-07 NOTE — Assessment & Plan Note (Signed)
Advised zyrtec for PND. Lungs clear on exam.

## 2018-04-07 NOTE — Patient Instructions (Signed)
Health Maintenance After Age 83 After age 83, you are at a higher risk for certain long-term diseases and infections as well as injuries from falls. Falls are a major cause of broken bones and head injuries in people who are older than age 83. Getting regular preventive care can help to keep you healthy and well. Preventive care includes getting regular testing and making lifestyle changes as recommended by your health care provider. Talk with your health care provider about:  Which screenings and tests you should have. A screening is a test that checks for a disease when you have no symptoms.  A diet and exercise plan that is right for you. What should I know about screenings and tests to prevent falls? Screening and testing are the best ways to find a health problem early. Early diagnosis and treatment give you the best chance of managing medical conditions that are common after age 83. Certain conditions and lifestyle choices may make you more likely to have a fall. Your health care provider may recommend:  Regular vision checks. Poor vision and conditions such as cataracts can make you more likely to have a fall. If you wear glasses, make sure to get your prescription updated if your vision changes.  Medicine review. Work with your health care provider to regularly review all of the medicines you are taking, including over-the-counter medicines. Ask your health care provider about any side effects that may make you more likely to have a fall. Tell your health care provider if any medicines that you take make you feel dizzy or sleepy.  Osteoporosis screening. Osteoporosis is a condition that causes the bones to get weaker. This can make the bones weak and cause them to break more easily.  Blood pressure screening. Blood pressure changes and medicines to control blood pressure can make you feel dizzy.  Strength and balance checks. Your health care provider may recommend certain tests to check your  strength and balance while standing, walking, or changing positions.  Foot health exam. Foot pain and numbness, as well as not wearing proper footwear, can make you more likely to have a fall.  Depression screening. You may be more likely to have a fall if you have a fear of falling, feel emotionally low, or feel unable to do activities that you used to do.  Alcohol use screening. Using too much alcohol can affect your balance and may make you more likely to have a fall. What actions can I take to lower my risk of falls? General instructions  Talk with your health care provider about your risks for falling. Tell your health care provider if: ? You fall. Be sure to tell your health care provider about all falls, even ones that seem minor. ? You feel dizzy, sleepy, or off-balance.  Take over-the-counter and prescription medicines only as told by your health care provider. These include any supplements.  Eat a healthy diet and maintain a healthy weight. A healthy diet includes low-fat dairy products, low-fat (lean) meats, and fiber from whole grains, beans, and lots of fruits and vegetables. Home safety  Remove any tripping hazards, such as rugs, cords, and clutter.  Install safety equipment such as grab bars in bathrooms and safety rails on stairs.  Keep rooms and walkways well-lit. Activity   Follow a regular exercise program to stay fit. This will help you maintain your balance. Ask your health care provider what types of exercise are appropriate for you.  If you need a cane or   walker, use it as recommended by your health care provider.  Wear supportive shoes that have nonskid soles. Lifestyle  Do not drink alcohol if your health care provider tells you not to drink.  If you drink alcohol, limit how much you have: ? 0-1 drink a day for women. ? 0-2 drinks a day for men.  Be aware of how much alcohol is in your drink. In the [place] one drink equals one typical bottle of beer (12  oz), one-half glass of wine (5 oz), or one shot of hard liquor (1 oz).  Do not use any products that contain nicotine or tobacco, such as cigarettes and e-cigarettes. If you need help quitting, ask your health care provider. Summary  Having a healthy lifestyle and getting preventive care can help to protect your health and wellness after age 83.  Screening and testing are the best way to find a health problem early and help you avoid having a fall. Early diagnosis and treatment give you the best chance for managing medical conditions that are more common for people who are older than age 83.  Falls are a major cause of broken bones and head injuries in people who are older than age 83. Take precautions to prevent a fall at home.  Work with your health care provider to learn what changes you can make to improve your health and wellness and to prevent falls. This information is not intended to replace advice given to you by your health care provider. Make sure you discuss any questions you have with your health care provider. Document Released: 01/27/2017 Document Revised: 01/27/2017 Document Reviewed: 01/27/2017 Elsevier Interactive Patient Education  2019 Elsevier Inc.  

## 2018-04-07 NOTE — Assessment & Plan Note (Signed)
Checking lipid panel and adjust as needed.  

## 2018-04-13 ENCOUNTER — Other Ambulatory Visit: Payer: Self-pay | Admitting: Internal Medicine

## 2018-04-13 MED ORDER — ROSUVASTATIN CALCIUM 10 MG PO TABS: 10.0000 mg | ORAL_TABLET | Freq: Every day | ORAL | 3 refills | Status: DC

## 2018-06-10 ENCOUNTER — Ambulatory Visit: Payer: Self-pay | Admitting: *Deleted

## 2018-06-10 ENCOUNTER — Telehealth: Payer: Self-pay | Admitting: Neurology

## 2018-06-10 ENCOUNTER — Telehealth: Payer: Self-pay | Admitting: *Deleted

## 2018-06-10 ENCOUNTER — Ambulatory Visit: Payer: Medicare Other | Admitting: Neurology

## 2018-06-10 ENCOUNTER — Other Ambulatory Visit: Payer: Self-pay

## 2018-06-10 ENCOUNTER — Encounter: Payer: Self-pay | Admitting: Neurology

## 2018-06-10 DIAGNOSIS — R413 Other amnesia: Secondary | ICD-10-CM

## 2018-06-10 NOTE — Telephone Encounter (Signed)
Do you want me to call and ask patient more specific questions to travel or large crowded areas. Do you want patient to come in? My worry is that neurology did not want to see him at his appointment

## 2018-06-10 NOTE — Telephone Encounter (Signed)
Pt. presented to our office for f/u of memory loss. He c/o cough, sore throat. Pt. was given a mask. Oral temp in our office is 99.5. Dr. Jannifer Franklin was consulted; pt. r/s and advised to call pcp for current sx./fim

## 2018-06-10 NOTE — Telephone Encounter (Signed)
Spoke with Tommy Browning at the [facility]. Pt. does not meet criteria for COVID testing/tracking. (has not traveled to [place], has not been in direct contact with anyone with known coronavirus, fever not over 100.4).  Pt's name given to sleep lab manager Shirlean Mylar, so that we can f/u if needed/fim

## 2018-06-10 NOTE — Progress Notes (Signed)
The patient was not seen today secondary to a fever, he will be rescheduled when he is feeling better.

## 2018-06-10 NOTE — Telephone Encounter (Signed)
Patient was not able to be seen today due to being sick and running a fever. Patient was given a mask and told to call back to r/s. Patient states that he wants his co-pay to be a credit toward his next visit.

## 2018-06-10 NOTE — Telephone Encounter (Signed)
Contacted pt regarding his symptoms; he states that he went to his neurologist on 06/10/2018 and since his temp wasf 99.0 he was not seen; the pt says that he has had a head cold and sneezing for the past  3 weeks; he says that chills intermittently; he is coughing up clear phlegm, and has clear or yellowish secretions nasal secretions;  Recommendations made per nurse triage protocol; pt offered and accepted appointment with Dr Pricilla Holm, [facility],   Reason for Disposition . [1] Nasal discharge AND [2] present > 10 days  Answer Assessment - Initial Assessment Questions 1. ONSET: "When did the cough begin?"      05/19/2018 2. SEVERITY: "How bad is the cough today?"      mild 3. RESPIRATORY DISTRESS: "Describe your breathing."      Breathing ok 4. FEVER: "Do you have a fever?" If so, ask: "What is your temperature, how was it measured, and when did it start?"     99.0 5. SPUTUM: "Describe the color of your sputum" (clear, white, yellow, green)     clear 6. HEMOPTYSIS: "Are you coughing up any blood?" If so ask: "How much?" (flecks, streaks, tablespoons, etc.)     no 7. CARDIAC HISTORY: "Do you have any history of heart disease?" (e.g., heart attack, congestive heart failure)      No 8. LUNG HISTORY: "Do you have any history of lung disease?"  (e.g., pulmonary embolus, asthma, emphysema)     no 9. PE RISK FACTORS: "Do you have a history of blood clots?" (or: recent major surgery, recent prolonged travel, bedridden)     no 10. OTHER SYMPTOMS: "Do you have any other symptoms?" (e.g., runny nose, wheezing, chest pain)       Runny nose 11. PREGNANCY: "Is there any chance you are pregnant?" "When was your last menstrual period?"       n/a 12. TRAVEL: "Have you traveled out of the country in the last month?" (e.g., travel history, exposures)       no  Protocols used: [place]

## 2018-06-10 NOTE — Telephone Encounter (Signed)
Called patient states has not traveled anywhere in the [place], not been to any airports, or large crawded areas besides grocery stores

## 2018-06-10 NOTE — Telephone Encounter (Signed)
You can call but okay for him to come in

## 2018-06-13 ENCOUNTER — Ambulatory Visit: Payer: Medicare Other | Admitting: Internal Medicine

## 2018-06-16 ENCOUNTER — Telehealth: Payer: Self-pay | Admitting: Neurology

## 2018-06-16 NOTE — Telephone Encounter (Signed)
Patient had a temperature and missed his apt on the 13 th . Patient states he is having bad night mares. Patient states  he is still feeling sick and is requesting to stop donepezil . Please call.

## 2018-06-16 NOTE — Telephone Encounter (Signed)
I tried to call the patient, left message on the cell phone but tried to call the home phone multiple times, it is busy, I will try to call back later.

## 2018-06-16 NOTE — Telephone Encounter (Signed)
Noted  

## 2018-06-17 NOTE — Telephone Encounter (Signed)
I was finally able to reach the patient.  What I recommended is that he start taking the 10 mg Aricept in the morning rather than the evening, if the vivid dreams continue, he is to cut the dose in half and take 5 mg in the morning.  If the vivid dreams continue after that, he is to stop the medication and call our office.

## 2018-08-06 ENCOUNTER — Other Ambulatory Visit: Payer: Self-pay | Admitting: Adult Health

## 2018-12-08 ENCOUNTER — Other Ambulatory Visit (HOSPITAL_COMMUNITY): Payer: Self-pay | Admitting: Nurse Practitioner

## 2018-12-08 DIAGNOSIS — C61 Malignant neoplasm of prostate: Secondary | ICD-10-CM

## 2018-12-20 ENCOUNTER — Other Ambulatory Visit: Payer: Self-pay

## 2018-12-20 ENCOUNTER — Ambulatory Visit (HOSPITAL_COMMUNITY)
Admission: RE | Admit: 2018-12-20 | Discharge: 2018-12-20 | Disposition: A | Discharge status: Home / Self Care | Payer: Medicare Other | Source: Ambulatory Visit | Attending: Nurse Practitioner | Admitting: Nurse Practitioner

## 2018-12-20 DIAGNOSIS — C61 Malignant neoplasm of prostate: Secondary | ICD-10-CM | POA: Insufficient documentation

## 2018-12-20 MED ORDER — AXUMIN (FLUCICLOVINE F 18) INJECTION
9.5200 | Freq: Once | INTRAVENOUS | Status: AC
  Administered 2018-12-20: 9.52 via INTRAVENOUS

## 2019-04-10 ENCOUNTER — Encounter: Payer: Medicare Other | Admitting: Internal Medicine

## 2019-04-11 ENCOUNTER — Encounter: Payer: Self-pay | Admitting: Internal Medicine

## 2019-04-11 ENCOUNTER — Other Ambulatory Visit: Payer: Self-pay

## 2019-04-11 ENCOUNTER — Ambulatory Visit (INDEPENDENT_AMBULATORY_CARE_PROVIDER_SITE_OTHER): Payer: Medicare PPO | Admitting: Internal Medicine

## 2019-04-11 VITALS — BP 126/84 | HR 80 | Temp 97.7°F | Ht 69.5 in | Wt 141.0 lb

## 2019-04-11 DIAGNOSIS — E782 Mixed hyperlipidemia: Secondary | ICD-10-CM | POA: Diagnosis not present

## 2019-04-11 DIAGNOSIS — Z0000 Encounter for general adult medical examination without abnormal findings: Secondary | ICD-10-CM

## 2019-04-11 DIAGNOSIS — R413 Other amnesia: Secondary | ICD-10-CM

## 2019-04-11 DIAGNOSIS — E538 Deficiency of other specified B group vitamins: Secondary | ICD-10-CM | POA: Diagnosis not present

## 2019-04-11 LAB — LIPID PANEL
Cholesterol: 168 mg/dL (ref 0–200)
HDL: 65.9 mg/dL (ref >39.00)
LDL Cholesterol: 87 mg/dL (ref 0–99)
NonHDL: 101.6
Total CHOL/HDL Ratio: 3
Triglycerides: 75 mg/dL (ref 0.0–149.0)
VLDL: 15 mg/dL (ref 0.0–40.0)

## 2019-04-11 LAB — COMPREHENSIVE METABOLIC PANEL
ALT: 16 U/L (ref 0–53)
AST: 27 U/L (ref 0–37)
Albumin: 4.2 g/dL (ref 3.5–5.2)
Alkaline Phosphatase: 59 U/L (ref 39–117)
BUN: 12 mg/dL (ref 6–23)
CO2: 29 mEq/L (ref 19–32)
Calcium: 9.4 mg/dL (ref 8.4–10.5)
Chloride: 103 mEq/L (ref 96–112)
Creatinine, Ser: 1.01 mg/dL (ref 0.40–1.50)
GFR: 84.44 mL/min (ref >60.00)
Glucose, Bld: 92 mg/dL (ref 70–99)
Potassium: 3.8 mEq/L (ref 3.5–5.1)
Sodium: 138 mEq/L (ref 135–145)
Total Bilirubin: 0.7 mg/dL (ref 0.2–1.2)
Total Protein: 7.1 g/dL (ref 6.0–8.3)

## 2019-04-11 LAB — CBC
HCT: 41.4 % (ref 39.0–52.0)
Hemoglobin: 13.6 g/dL (ref 13.0–17.0)
MCHC: 32.9 g/dL (ref 30.0–36.0)
MCV: 91.3 fl (ref 78.0–100.0)
Platelets: 184 10*3/uL (ref 150.0–400.0)
RBC: 4.53 Mil/uL (ref 4.22–5.81)
RDW: 13.7 % (ref 11.5–15.5)
WBC: 3.3 10*3/uL — ABNORMAL LOW (ref 4.0–10.5)

## 2019-04-11 NOTE — Assessment & Plan Note (Signed)
Taking crestor 10 mg daily. Checking lipid panel and adjust as needed.

## 2019-04-11 NOTE — Patient Instructions (Signed)
We are committed to keeping you informed about the COVID-19 vaccine.  As the vaccine continues to become available for each phase, we will ensure that patients who meet the criteria receive the information they need to access vaccination opportunities. Continue to check your MyChart account and [email or web address] for updates. Please review the Phase 1b information below.  Following [place]'s guidelines for the distribution of COVID-19 vaccines, we are pleased to share our plans to begin offering vaccines to those 84 and older (Phase 1b). Here are details of those plans:  [facility] . Appointments required. [facility] Open to those age 4 or older . Not restricted to [facility] or [facility] residents . Location: [place], [place], [place]  . Offered daily beginning: Saturday, April 08, 2019 . Hours: 8 a.m. to 1 p.m. (10 a.m. to 2 p.m. this Saturday and Sunday, Jan. 9 and 10) . Registration for vaccine clinic appointments is open as of 10 a.m., Friday, January 8 . Please visit [email or web address] to register or call [phone] . There will be no copay required for the vaccine. Insurance information will be requested if available.  [place] will offer this vaccination clinic through Sunday, January 17, after which we will switch to a larger location to offer public vaccination at a larger scale following state guidelines. We will provide additional information as details become available.   Also, people who are 75 years and older can sign up to get vaccinated against COVID-19 at clinics that begin Monday through the [facility]. The vaccinations are open to all in that age group, regardless of their health condition or living situation.  Appointments are required and can be made beginning at 8 a.m. Friday by calling [phone] and selecting option 2. Walk-ins will not be  accepted. Clinic locations are: [facility], [place]; [facility] [address] [place]; . [place] at [facility], [id number], [place].  Participants are asked to wear a face covering at vaccination sites. Visit [email or web address] and click on the "[id number] Vaccine Info" rectangle for more information about vaccinations.  In addition to the clinics above, we are working in partnership with county health agencies in [place], [place], [place] and [place] counties to ensure continuing vaccination availability in alignment with state guidelines in the weeks and months ahead.   Information on phase 1b COVID-19 vaccination clinics being offered by local county health agencies is provided in the website links below for your convenience:   . [place]  . [place]  . [place] . [place]  . [place]'s phase 1b vaccination guidelines, prioritizing those 84 and over as the next eligible group to receive the COVID-19 vaccine, are detailed at [email or web address].   Additional information: Those 84 and older who register for [place] COVID-19 vaccination clinic at [facility] will be provided with additional information, including the need to be observed for 15 minutes following vaccination for your safety. Our COVID-19 testing clinic will not start at this site until 2 p.m. daily to ensure no people arriving for vaccination intersect with those seeking a COVID-19 test. [employer] rooms at the [facility] are clean and safe, and our staff will use all appropriate protective equipment to keep you safe. This vaccine clinic will be located in a completely separate area of this facility  than where health care is provided. No interaction will occur between patients or care teams at this  site and our vaccination clinic.   As we proceed through phases of the vaccine rollout, we remind everyone to remain vigilant in practicing the 3 W's - wear a mask, wash your hands and wait 6 feet apart from others. These safety practices are based in science and are the best tool we have to reduce the spread of the virus.   For our most current information, please visit [email or web address].   Health Maintenance, Male Adopting a healthy lifestyle and getting preventive care are important in promoting health and wellness. Ask your health care provider about:  The right schedule for you to have regular tests and exams.  Things you can do on your own to prevent diseases and keep yourself healthy. What should I know about diet, weight, and exercise? Eat a healthy diet   Eat a diet that includes plenty of vegetables, fruits, low-fat dairy products, and lean protein.  Do not eat a lot of foods that are high in solid fats, added sugars, or sodium. Maintain a healthy weight Body mass index (BMI) is a measurement that can be used to identify possible weight problems. It estimates body fat based on height and weight. Your health care provider can help determine your BMI and help you achieve or maintain a healthy weight. Get regular exercise Get regular exercise. This is one of the most important things you can do for your health. Most adults should:  Exercise for at least 150 minutes each week. The exercise should increase your heart rate and make you sweat (moderate-intensity exercise).  Do strengthening exercises at least twice a week. This is in addition to the moderate-intensity exercise.  Spend less time sitting. Even light physical activity can be beneficial. Watch cholesterol and blood lipids Have your blood tested for lipids and cholesterol at 84 years of age, then have this test every 5 years. You may need to have your cholesterol levels checked more often if:  Your lipid  or cholesterol levels are high.  You are older than 84 years of age.  You are at high risk for heart disease. What should I know about cancer screening? Many types of cancers can be detected early and may often be prevented. Depending on your health history and family history, you may need to have cancer screening at various ages. This may include screening for:  Colorectal cancer.  Prostate cancer.  Skin cancer.  Lung cancer. What should I know about heart disease, diabetes, and high blood pressure? Blood pressure and heart disease  High blood pressure causes heart disease and increases the risk of stroke. This is more likely to develop in people who have high blood pressure readings, are of African descent, or are overweight.  Talk with your health care provider about your target blood pressure readings.  Have your blood pressure checked: ? Every 3-5 years if you are 73-81 years of age. ? Every year if you are 64 years old or older.  If you are between the ages of 49 and 21 and are a current or former smoker, ask your health care provider if you should have a one-time screening for abdominal aortic aneurysm (AAA). Diabetes Have regular diabetes screenings. This checks your fasting blood sugar level. Have the screening done:  Once every three years after age 2 if you are at a normal weight and have a low risk for diabetes.  More often and  at a younger age if you are overweight or have a high risk for diabetes. What should I know about preventing infection? Hepatitis B If you have a higher risk for hepatitis B, you should be screened for this virus. Talk with your health care provider to find out if you are at risk for hepatitis B infection. Hepatitis C Blood testing is recommended for:  Everyone born from [birth year] through 1963.  Anyone with known risk factors for hepatitis C. Sexually transmitted infections (STIs)  You should be screened each year for STIs, including  gonorrhea and chlamydia, if: ? You are sexually active and are younger than 84 years of age. ? You are older than 84 years of age and your health care provider tells you that you are at risk for this type of infection. ? Your sexual activity has changed since you were last screened, and you are at increased risk for chlamydia or gonorrhea. Ask your health care provider if you are at risk.  Ask your health care provider about whether you are at high risk for HIV. Your health care provider may recommend a prescription medicine to help prevent HIV infection. If you choose to take medicine to prevent HIV, you should first get tested for HIV. You should then be tested every 3 months for as long as you are taking the medicine. Follow these instructions at home: Lifestyle  Do not use any products that contain nicotine or tobacco, such as cigarettes, e-cigarettes, and chewing tobacco. If you need help quitting, ask your health care provider.  Do not use street drugs.  Do not share needles.  Ask your health care provider for help if you need support or information about quitting drugs. Alcohol use  Do not drink alcohol if your health care provider tells you not to drink.  If you drink alcohol: ? Limit how much you have to 0-2 drinks a day. ? Be aware of how much alcohol is in your drink. In the [place] one drink equals one 12 oz bottle of beer (355 mL), one 5 oz glass of wine (148 mL), or one 1 oz glass of hard liquor (44 mL). General instructions  Schedule regular health, dental, and eye exams.  Stay current with your vaccines.  Tell your health care provider if: ? You often feel depressed. ? You have ever been abused or do not feel safe at home. Summary  Adopting a healthy lifestyle and getting preventive care are important in promoting health and wellness.  Follow your health care provider's instructions about healthy diet, exercising, and getting tested or screened for  diseases.  Follow your health care provider's instructions on monitoring your cholesterol and blood pressure. This information is not intended to replace advice given to you by your health care provider. Make sure you discuss any questions you have with your health care provider. Document Revised: 03/09/2018 Document Reviewed: 03/09/2018 Elsevier Patient Education  2020 [employer].

## 2019-04-11 NOTE — Assessment & Plan Note (Signed)
Flu shot up to date. Pneumonia complete. Shingrix counseled. Tetanus due 2027. Colonoscopy aged out. Counseled about sun safety and mole surveillance. Counseled about the dangers of distracted driving. Given 10 year screening recommendations.

## 2019-04-11 NOTE — Progress Notes (Signed)
Subjective:   Patient ID: Tommy Browning., male    DOB: [birth year], 86 y.o.   MRN: [id number]  HPI Here for medicare wellness and physical, no new complaints. Please see A/P for status and treatment of chronic medical problems.   Diet: heart healthy  Physical activity: sedentary Depression/mood screen: negative Hearing: intact to whispered voice Visual acuity: grossly normal, performs annual eye exam  ADLs: capable Fall risk: none Home safety: good Cognitive evaluation: intact to orientation, naming, recall and repetition EOL planning: adv directives discussed    Office Visit from 04/11/2019 in [place] at [facility] Total Score  0        Clinical Support from 02/23/2017 in [place]  PHQ-9 Total Score  6      I have personally reviewed and have noted 1. The patient's medical and social history - reviewed today no changes 2. Their use of alcohol, tobacco or illicit drugs 3. Their current medications and supplements 4. The patient's functional ability including ADL's, fall risks, home safety risks and hearing or visual impairment. 5. Diet and physical activities 6. Evidence for depression or mood disorders 7. Care team reviewed and updated  Patient Care Team: Hoyt Koch, MD as PCP - General (Internal Medicine) Meryl Crutch Vickki Muff, MD as Referring Physician (Ophthalmology) Arta Bruce, [place] as Referring Physician (Ophthalmology) Kathrynn Ducking, MD as Consulting Physician (Neurology) Renato Shin, MD as Consulting Physician (Endocrinology) Past Medical History:  Diagnosis Date  . BACK PAIN 02/24/2008  . Diplopia   . Diverticulosis   . ELECTROCARDIOGRAM, ABNORMAL 12/08/2006   F/U by a normal stress test on September 2005  . ERECTILE DYSFUNCTION, ORGANIC 05/30/2009  . History of thyroid nodule    Resolved by [place] 11/2006  . [place], MILD 05/30/2009  . HYPERTHYROIDISM 12/08/2006   [place]  .  HYPERTROPHY PROSTATE W/UR OBST & OTH LUTS 02/26/2009  . HYPOGONADISM 02/03/2010  . Ischemic colitis ([place])   . Tubular adenoma of colon 2004   Past Surgical History:  Procedure Laterality Date  . COLONOSCOPY    . FLEXIBLE SIGMOIDOSCOPY N/A 03/08/2015   Procedure: FLEXIBLE SIGMOIDOSCOPY;  Surgeon: Ladene Artist, MD;  Location: [facility] ENDOSCOPY;  Service: Endoscopy;  Laterality: N/A;   Family History  Problem Relation Age of Onset  . Colon cancer Sister 110  . Stroke Other        GP  . Diabetes Neg Hx   . Thyroid disease Neg Hx        no goiter or other thyroid problem  . Stomach cancer Neg Hx   . Prostate cancer Neg Hx    Review of Systems  Constitutional: Negative.   HENT: Negative.   Eyes: Negative.   Respiratory: Negative for cough, chest tightness and shortness of breath.   Cardiovascular: Negative for chest pain, palpitations and leg swelling.  Gastrointestinal: Negative for abdominal distention, abdominal pain, constipation, diarrhea, nausea and vomiting.  Musculoskeletal: Positive for arthralgias.  Skin: Negative.   Neurological: Negative.   Psychiatric/Behavioral: Negative.     Objective:  Physical Exam Constitutional:      Appearance: He is well-developed.  HENT:     Head: Normocephalic and atraumatic.  Cardiovascular:     Rate and Rhythm: Normal rate and regular rhythm.  Pulmonary:     Effort: Pulmonary effort is normal. No respiratory distress.     Breath sounds: Normal breath sounds. No wheezing or rales.  Abdominal:     General: Bowel  sounds are normal. There is no distension.     Palpations: Abdomen is soft.     Tenderness: There is no abdominal tenderness. There is no rebound.  Musculoskeletal:        General: Tenderness present.     Cervical back: Normal range of motion.     Comments: Bilateral knee pain  Skin:    General: Skin is warm and dry.  Neurological:     Mental Status: He is alert and oriented to person, place, and time.     Coordination:  Coordination normal.     Vitals:   04/11/19 1348  BP: 126/84  Pulse: 80  Temp: 97.7 F (36.5 C)  TempSrc: Oral  SpO2: 96%  Weight: 141 lb (64 kg)  Height: 5' 9.5" (1.765 m)    This visit occurred during the SARS-CoV-2 public health emergency.  Safety protocols were in place, including screening questions prior to the visit, additional usage of staff PPE, and extensive cleaning of exam room while observing appropriate contact time as indicated for disinfecting solutions.   Assessment & Plan:

## 2019-04-11 NOTE — Assessment & Plan Note (Signed)
Taking aricept 10 mg daily without any problems.

## 2019-04-12 LAB — VITAMIN B12: Vitamin B-12: 721 pg/mL (ref 211–911)

## 2019-06-19 ENCOUNTER — Other Ambulatory Visit: Payer: Self-pay | Admitting: Internal Medicine

## 2019-07-24 DIAGNOSIS — C61 Malignant neoplasm of prostate: Secondary | ICD-10-CM | POA: Diagnosis not present

## 2019-07-31 DIAGNOSIS — C61 Malignant neoplasm of prostate: Secondary | ICD-10-CM | POA: Diagnosis not present

## 2019-09-14 ENCOUNTER — Other Ambulatory Visit: Payer: Self-pay | Admitting: Neurology

## 2019-11-26 ENCOUNTER — Other Ambulatory Visit: Payer: Self-pay | Admitting: Neurology

## 2019-11-27 NOTE — Telephone Encounter (Signed)
Pt needs to make appt for more refills

## 2019-12-05 DIAGNOSIS — Z87891 Personal history of nicotine dependence: Secondary | ICD-10-CM | POA: Diagnosis not present

## 2019-12-05 DIAGNOSIS — E785 Hyperlipidemia, unspecified: Secondary | ICD-10-CM | POA: Diagnosis not present

## 2019-12-05 DIAGNOSIS — Z823 Family history of stroke: Secondary | ICD-10-CM | POA: Diagnosis not present

## 2019-12-05 DIAGNOSIS — Z8249 Family history of ischemic heart disease and other diseases of the circulatory system: Secondary | ICD-10-CM | POA: Diagnosis not present

## 2019-12-05 DIAGNOSIS — Z8546 Personal history of malignant neoplasm of prostate: Secondary | ICD-10-CM | POA: Diagnosis not present

## 2019-12-05 DIAGNOSIS — F0390 Unspecified dementia without behavioral disturbance: Secondary | ICD-10-CM | POA: Diagnosis not present

## 2019-12-20 ENCOUNTER — Other Ambulatory Visit: Payer: Self-pay | Admitting: Neurology

## 2020-02-01 DIAGNOSIS — C61 Malignant neoplasm of prostate: Secondary | ICD-10-CM | POA: Diagnosis not present

## 2020-02-08 DIAGNOSIS — C61 Malignant neoplasm of prostate: Secondary | ICD-10-CM | POA: Diagnosis not present

## 2020-02-12 ENCOUNTER — Other Ambulatory Visit (HOSPITAL_COMMUNITY): Payer: Self-pay | Admitting: Urology

## 2020-02-12 DIAGNOSIS — C61 Malignant neoplasm of prostate: Secondary | ICD-10-CM

## 2020-03-07 ENCOUNTER — Ambulatory Visit (HOSPITAL_COMMUNITY)
Admission: RE | Admit: 2020-03-07 | Discharge: 2020-03-07 | Disposition: A | Discharge status: Home / Self Care | Payer: Medicare PPO | Source: Ambulatory Visit | Attending: Urology | Admitting: Urology

## 2020-03-07 ENCOUNTER — Other Ambulatory Visit: Payer: Self-pay

## 2020-03-07 ENCOUNTER — Other Ambulatory Visit: Payer: Self-pay | Admitting: Neurology

## 2020-03-07 DIAGNOSIS — C61 Malignant neoplasm of prostate: Secondary | ICD-10-CM | POA: Insufficient documentation

## 2020-03-08 ENCOUNTER — Other Ambulatory Visit: Payer: Self-pay

## 2020-03-08 ENCOUNTER — Ambulatory Visit (HOSPITAL_COMMUNITY)
Admission: RE | Admit: 2020-03-08 | Discharge: 2020-03-08 | Disposition: A | Discharge status: Home / Self Care | Payer: Medicare PPO | Source: Ambulatory Visit | Attending: Urology | Admitting: Urology

## 2020-03-08 DIAGNOSIS — C61 Malignant neoplasm of prostate: Secondary | ICD-10-CM | POA: Insufficient documentation

## 2020-03-08 DIAGNOSIS — I700 Atherosclerosis of aorta: Secondary | ICD-10-CM | POA: Diagnosis not present

## 2020-03-08 DIAGNOSIS — I2510 Atherosclerotic heart disease of native coronary artery without angina pectoris: Secondary | ICD-10-CM | POA: Diagnosis not present

## 2020-03-08 DIAGNOSIS — N281 Cyst of kidney, acquired: Secondary | ICD-10-CM | POA: Diagnosis not present

## 2020-03-08 MED ORDER — AXUMIN (FLUCICLOVINE F 18) INJECTION
9.7000 | Freq: Once | INTRAVENOUS | Status: AC
  Administered 2020-03-08: 9.7 via INTRAVENOUS

## 2020-04-16 ENCOUNTER — Ambulatory Visit: Payer: Medicare PPO

## 2020-04-19 ENCOUNTER — Ambulatory Visit: Payer: Medicare PPO

## 2020-04-27 ENCOUNTER — Other Ambulatory Visit: Payer: Self-pay | Admitting: Neurology

## 2020-05-01 ENCOUNTER — Ambulatory Visit: Payer: Medicare PPO

## 2020-05-01 ENCOUNTER — Other Ambulatory Visit: Payer: Self-pay

## 2020-05-01 DIAGNOSIS — Z23 Encounter for immunization: Secondary | ICD-10-CM

## 2020-05-01 DIAGNOSIS — Z0000 Encounter for general adult medical examination without abnormal findings: Secondary | ICD-10-CM | POA: Diagnosis not present

## 2020-05-01 NOTE — Patient Instructions (Signed)
Tommy Browning , Thank you for taking time to come for your Medicare Wellness Visit. I appreciate your ongoing commitment to your health goals. Please review the following plan we discussed and let me know if I can assist you in the future.   Screening recommendations/referrals: Colonoscopy: not a candidate for colon cancer screening due to age Recommended yearly ophthalmology/optometry visit for glaucoma screening and checkup Recommended yearly dental visit for hygiene and checkup  Vaccinations: Influenza vaccine: 05/01/2020 Pneumococcal vaccine: 06/28/2005, 12/31/2014 Tdap vaccine: 01/01/2016; due every 10 years Shingles vaccine: 03/01/2019, 06/27/2019 (up to date) Covid-19: 04/21/2019, 05/12/2019  Advanced directives: Please bring a copy of your health care power of attorney and living will to the office at your convenience.  Conditions/risks identified: Yes; Reviewed health maintenance screenings with patient today and relevant education, vaccines, and/or referrals were provided. Please continue to do your personal lifestyle choices by: daily care of teeth and gums, regular physical activity (goal should be 5 days a week for 30 minutes), eat a healthy diet, avoid tobacco and drug use, limiting any alcohol intake, taking a low-dose aspirin (if not allergic or have been advised by your provider otherwise) and taking vitamins and minerals as recommended by your provider. Continue doing brain stimulating activities (puzzles, reading, adult coloring books, staying active) to keep memory sharp. Continue to eat heart healthy diet (full of fruits, vegetables, whole grains, lean protein, water--limit salt, fat, and sugar intake) and increase physical activity as tolerated.  Next appointment: Please schedule your next Medicare Wellness Visit with your Nurse Health Advisor in 1 year by calling [phone].  Preventive Care 85 Years and Older, Male Preventive care refers to lifestyle choices and visits with your  health care provider that can promote health and wellness. What does preventive care include?  A yearly physical exam. This is also called an annual well check.  Dental exams once or twice a year.  Routine eye exams. Ask your health care provider how often you should have your eyes checked.  Personal lifestyle choices, including:  Daily care of your teeth and gums.  Regular physical activity.  Eating a healthy diet.  Avoiding tobacco and drug use.  Limiting alcohol use.  Practicing safe sex.  Taking low doses of aspirin every day.  Taking vitamin and mineral supplements as recommended by your health care provider. What happens during an annual well check? The services and screenings done by your health care provider during your annual well check will depend on your age, overall health, lifestyle risk factors, and family history of disease. Counseling  Your health care provider may ask you questions about your:  Alcohol use.  Tobacco use.  Drug use.  Emotional well-being.  Home and relationship well-being.  Sexual activity.  Eating habits.  History of falls.  Memory and ability to understand (cognition).  Work and work Statistician. Screening  You may have the following tests or measurements:  Height, weight, and BMI.  Blood pressure.  Lipid and cholesterol levels. These may be checked every 5 years, or more frequently if you are over 85 years old.  Skin check.  Lung cancer screening. You may have this screening every year starting at age 53 if you have a 30-pack-year history of smoking and currently smoke or have quit within the past 15 years.  Fecal occult blood test (FOBT) of the stool. You may have this test every year starting at age 85.  Flexible sigmoidoscopy or colonoscopy. You may have a sigmoidoscopy every 5 years or a  colonoscopy every 10 years starting at age 26.  Prostate cancer screening. Recommendations will vary depending on your family  history and other risks.  Hepatitis C blood test.  Hepatitis B blood test.  Sexually transmitted disease (STD) testing.  Diabetes screening. This is done by checking your blood sugar (glucose) after you have not eaten for a while (fasting). You may have this done every 1-3 years.  Abdominal aortic aneurysm (AAA) screening. You may need this if you are a current or former smoker.  Osteoporosis. You may be screened starting at age 24 if you are at high risk. Talk with your health care provider about your test results, treatment options, and if necessary, the need for more tests. Vaccines  Your health care provider may recommend certain vaccines, such as:  Influenza vaccine. This is recommended every year.  Tetanus, diphtheria, and acellular pertussis (Tdap, Td) vaccine. You may need a Td booster every 10 years.  Zoster vaccine. You may need this after age 19.  Pneumococcal 13-valent conjugate (PCV13) vaccine. One dose is recommended after age 23.  Pneumococcal polysaccharide (PPSV23) vaccine. One dose is recommended after age 85. Talk to your health care provider about which screenings and vaccines you need and how often you need them. This information is not intended to replace advice given to you by your health care provider. Make sure you discuss any questions you have with your health care provider. Document Released: 04/12/2015 Document Revised: 12/04/2015 Document Reviewed: 01/15/2015 Elsevier Interactive Patient Education  2017 [place] Prevention in the Home Falls can cause injuries. They can happen to people of all ages. There are many things you can do to make your home safe and to help prevent falls. What can I do on the outside of my home?  Regularly fix the edges of walkways and driveways and fix any cracks.  Remove anything that might make you trip as you walk through a door, such as a raised step or threshold.  Trim any bushes or trees on the path to  your home.  Use bright outdoor lighting.  Clear any walking paths of anything that might make someone trip, such as rocks or tools.  Regularly check to see if handrails are loose or broken. Make sure that both sides of any steps have handrails.  Any raised decks and porches should have guardrails on the edges.  Have any leaves, snow, or ice cleared regularly.  Use sand or salt on walking paths during winter.  Clean up any spills in your garage right away. This includes oil or grease spills. What can I do in the bathroom?  Use night lights.  Install grab bars by the toilet and in the tub and shower. Do not use towel bars as grab bars.  Use non-skid mats or decals in the tub or shower.  If you need to sit down in the shower, use a plastic, non-slip stool.  Keep the floor dry. Clean up any water that spills on the floor as soon as it happens.  Remove soap buildup in the tub or shower regularly.  Attach bath mats securely with double-sided non-slip rug tape.  Do not have throw rugs and other things on the floor that can make you trip. What can I do in the bedroom?  Use night lights.  Make sure that you have a light by your bed that is easy to reach.  Do not use any sheets or blankets that are too big for your bed. They  should not hang down onto the floor.  Have a firm chair that has side arms. You can use this for support while you get dressed.  Do not have throw rugs and other things on the floor that can make you trip. What can I do in the kitchen?  Clean up any spills right away.  Avoid walking on wet floors.  Keep items that you use a lot in easy-to-reach places.  If you need to reach something above you, use a strong step stool that has a grab bar.  Keep electrical cords out of the way.  Do not use floor polish or wax that makes floors slippery. If you must use wax, use non-skid floor wax.  Do not have throw rugs and other things on the floor that can make  you trip. What can I do with my stairs?  Do not leave any items on the stairs.  Make sure that there are handrails on both sides of the stairs and use them. Fix handrails that are broken or loose. Make sure that handrails are as long as the stairways.  Check any carpeting to make sure that it is firmly attached to the stairs. Fix any carpet that is loose or worn.  Avoid having throw rugs at the top or bottom of the stairs. If you do have throw rugs, attach them to the floor with carpet tape.  Make sure that you have a light switch at the top of the stairs and the bottom of the stairs. If you do not have them, ask someone to add them for you. What else can I do to help prevent falls?  Wear shoes that:  Do not have high heels.  Have rubber bottoms.  Are comfortable and fit you well.  Are closed at the toe. Do not wear sandals.  If you use a stepladder:  Make sure that it is fully opened. Do not climb a closed stepladder.  Make sure that both sides of the stepladder are locked into place.  Ask someone to hold it for you, if possible.  Clearly mark and make sure that you can see:  Any grab bars or handrails.  First and last steps.  Where the edge of each step is.  Use tools that help you move around (mobility aids) if they are needed. These include:  Canes.  Walkers.  Scooters.  Crutches.  Turn on the lights when you go into a dark area. Replace any light bulbs as soon as they burn out.  Set up your furniture so you have a clear path. Avoid moving your furniture around.  If any of your floors are uneven, fix them.  If there are any pets around you, be aware of where they are.  Review your medicines with your doctor. Some medicines can make you feel dizzy. This can increase your chance of falling. Ask your doctor what other things that you can do to help prevent falls. This information is not intended to replace advice given to you by your health care provider.  Make sure you discuss any questions you have with your health care provider. Document Released: 01/10/2009 Document Revised: 08/22/2015 Document Reviewed: 04/20/2014 Elsevier Interactive Patient Education  2017 [employer].

## 2020-05-01 NOTE — Progress Notes (Signed)
Subjective:   Tommy Hugo. is a 87 y.o. male who presents for Medicare Annual/Subsequent preventive examination.  Review of Systems    No ROS. Medicare Wellness Visit. Additional risk factors are reflected in social history. Cardiac Risk Factors include: advanced age (>56men, >61 women);dyslipidemia;male gender     Objective:    Today's Vitals   05/01/20 1433  BP: 128/60  Pulse: (!) 54  Temp: 98.2 F (36.8 C)  SpO2: 98%  Weight: 153 lb 9.6 oz (69.7 kg)  Height: 5\' 9"  (1.753 m)  PainSc: 0-No pain   Body mass index is 22.68 kg/m.  Advanced Directives 05/01/2020 02/23/2017 03/08/2015 03/07/2015 01/08/2015 01/07/2015  Does Patient Have a Medical Advance Directive? Yes No No No No No  Type of Paramedic of [place];Living will - - - - -  Does patient want to make changes to medical advance directive? No - Patient declined - - - - -  Copy of [place] in Chart? No - copy requested - - - - -  Would patient like information on creating a medical advance directive? - No - Patient declined - - No - patient declined information Yes - Educational materials given    Current Medications (verified) Outpatient Encounter Medications as of 05/01/2020  Medication Sig  . aspirin 81 MG tablet Take 81 mg by mouth daily.  . cyanocobalamin 1000 MCG tablet Take 1,000 mcg by mouth daily.  . fish oil-omega-3 fatty acids 1000 MG capsule Take 2 g by mouth daily.  . Multiple Vitamin (MULTIVITAMIN) tablet Take 1 tablet by mouth daily.  . rosuvastatin (CRESTOR) 10 MG tablet TAKE 1 TABLET BY MOUTH EVERY DAY  . carboxymethylcellulose (REFRESH PLUS) 0.5 % SOLN Place 1 drop into both eyes 4 (four) times daily - after meals and at bedtime. (Patient not taking: Reported on 05/01/2020)  . donepezil (ARICEPT) 10 MG tablet Take 1 tablet (10 mg total) by mouth every morning. (Patient not taking: Reported on 05/01/2020)  . dorzolamide-timolol (COSOPT) 22.3-6.8 MG/ML  ophthalmic solution Place 1 drop into both eyes every 12 (twelve) hours.  (Patient not taking: Reported on 05/01/2020)   No facility-administered encounter medications on file as of 05/01/2020.    Allergies (verified) Patient has no known allergies.   History: Past Medical History:  Diagnosis Date  . BACK PAIN 02/24/2008  . Diplopia   . Diverticulosis   . ELECTROCARDIOGRAM, ABNORMAL 12/08/2006   F/U by a normal stress test on September 2005  . ERECTILE DYSFUNCTION, ORGANIC 05/30/2009  . History of thyroid nodule    Resolved by [place] 11/2006  . [place], MILD 05/30/2009  . HYPERTHYROIDISM 12/08/2006   [place]  . HYPERTROPHY PROSTATE W/UR OBST & OTH LUTS 02/26/2009  . HYPOGONADISM 02/03/2010  . Ischemic colitis ([place])   . Tubular adenoma of colon 2004   Past Surgical History:  Procedure Laterality Date  . COLONOSCOPY    . FLEXIBLE SIGMOIDOSCOPY N/A 03/08/2015   Procedure: FLEXIBLE SIGMOIDOSCOPY;  Surgeon: Ladene Artist, MD;  Location: [facility] ENDOSCOPY;  Service: Endoscopy;  Laterality: N/A;   Family History  Problem Relation Age of Onset  . Colon cancer Sister 33  . Stroke Other        GP  . Diabetes Neg Hx   . Thyroid disease Neg Hx        no goiter or other thyroid problem  . Stomach cancer Neg Hx   . Prostate cancer Neg Hx    Social History  Socioeconomic History  . Marital status: Married    Spouse name: Tommy Browning  . Number of children: 2  . Years of education: 62  . Highest education level: Not on file  Occupational History  . 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 Smoking status: Former Smoker    Types: Cigarettes  . Smokeless tobacco: Never Used  . Tobacco comment: 1-2 cigarettes per day  Substance and Sexual Activity  . Alcohol use: Yes    Comment: very rare  . Drug use: No  . Sexual activity: Not on file    Comment: casual smoker  Other Topics Concern  . 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 Difficulty of Paying Living Expenses: Not hard at all  Food Insecurity: No Food Insecurity  . Worried About Charity fundraiser in the Last Year: Never true  . Ran Out of Food in the Last Year: Never true  Transportation Needs: No Transportation Needs  . Lack of Transportation (Medical): No  . Lack of Transportation (Non-Medical): No  Physical Activity: Sufficiently Active  . Days of Exercise per Week: 5 days  . Minutes of Exercise per Session: 30 min  Stress: No Stress Concern Present  . Feeling of Stress : Not at all  Social Connections: Socially Integrated  . Frequency of Communication with Friends and Family: More than three times a week  . Frequency of Social Gatherings with Friends and Family: More than three times a week  . Attends Religious Services: More than 4 times per year  . Active Member of Clubs or Organizations: Yes  . Attends Archivist Meetings: More than 4 times per year  . Marital Status: Married    Tobacco Counseling Counseling given: Not Answered Comment: 1-2 cigarettes per day   Clinical Intake:  Pre-visit preparation completed: Yes  Pain : No/denies pain Pain Score: 0-No pain     BMI - recorded: 22.68 Nutritional Status: BMI of 19-24  Normal Nutritional Risks: None Diabetes: No  How often do you need to have someone help you when you read instructions, pamphlets, or other written materials from your doctor or pharmacy?: 1 - Never What is the last grade level you completed in school?: Master's Degree  Diabetic? no  Interpreter Needed?: No  Information entered by :: Tommy Abu, LPN   Activities of Daily Living In your present state of health, do you have any difficulty performing  the following activities: 05/01/2020  Hearing? N  Vision? N  Difficulty concentrating or making decisions? Y  Walking or climbing stairs? N  Dressing or bathing? N  Doing errands, shopping? N  Preparing Food and eating ? N  Using the Toilet? N  In the past six months, have you accidently leaked urine? N  Do you have problems with loss of bowel control? N  Managing your Medications? N  Managing your Finances? N  Housekeeping or managing your Housekeeping? N  Some recent data might be hidden    Patient Care Team: Hoyt Koch, MD as PCP - General (Internal Medicine) Cheryle Horsfall,  MD as Referring Physician (Ophthalmology) Arta Bruce, OD as Referring Physician (Ophthalmology) Kathrynn Ducking, MD as Consulting Physician (Neurology) Renato Shin, MD as Consulting Physician (Endocrinology) Clent Jacks, MD as Consulting Physician (Ophthalmology)  Indicate any recent Medical Services you may have received from other than [facility] providers in the past year (date may be approximate).     Assessment:   This is a routine wellness examination for [place].  Hearing/Vision screen No exam data present  Dietary issues and exercise activities discussed: Current Exercise Habits: Home exercise routine, Type of exercise: walking, Time (Minutes): 30, Frequency (Times/Week): 5, Weekly Exercise (Minutes/Week): 150, Intensity: Moderate, Exercise limited by: None identified  Goals    . Client understands the importance of follow-up with providers by attending scheduled visits     Increase daily water intake.    . 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      Depression Screen PHQ 2/9 Scores 05/01/2020 04/11/2019 04/07/2018 02/23/2017 03/20/2015 12/28/2012  PHQ - 2 Score 0 0 1 2 2  0  PHQ- 9 Score - - - 6 11 -    Fall Risk Fall  Risk  05/01/2020 04/11/2019 04/07/2018 02/23/2017 12/01/2016  Falls in the past year? 0 0 0 No No  Comment - - - - -  Number falls in past yr: 0 - - - -  Injury with Fall? 0 - - - -  Risk for fall due to : No Fall Risks - - Impaired vision;Impaired balance/gait -  Follow up Falls evaluation completed;Education provided - - - -    FALL RISK PREVENTION PERTAINING TO THE HOME:  Any stairs in or around the home? Yes  If so, are there any without handrails? No  Home free of loose throw rugs in walkways, pet beds, electrical cords, etc? Yes  Adequate lighting in your home to reduce risk of falls? Yes   ASSISTIVE DEVICES UTILIZED TO PREVENT FALLS:  Life alert? No  Use of a cane, walker or w/c? No  Grab bars in the bathroom? Yes  Shower chair or bench in shower? No  Elevated toilet seat or a handicapped toilet? No   TIMED UP AND GO:  Was the test performed? Yes .  Length of time to ambulate 10 feet: 0 sec.   Gait steady and fast without use of assistive device  Cognitive Function: Cognitive status assessed by direct observation. Patient has current diagnosis of cognitive impairment. (Memory disorder) Patient is followed by neurology for ongoing assessment. [facility]) Patient is unable to complete screening 6CIT or MMSE.    MMSE - Mini Mental State Exam 12/01/2017 05/31/2017 02/23/2017 12/01/2016 05/28/2016  Orientation to time 4 4 4 4 5   Orientation to Place 5 4 5 5 4   Registration 3 3 3 3 3   Attention/ Calculation 4 3 4 1 5   Recall 0 0 0 2 1  Language- name 2 objects 2 2 2 2 2   Language- repeat 1 0 1 1 1   Language- follow 3 step command 3 3 3 3 3   Language- read & follow direction 1 0 1 1 1   Write a sentence 1 0 1 1 1   Copy design 0 1 1 1 1   Total score 24 20 25 24 27         Immunizations  Immunization History  Administered Date(s) Administered  . Fluad Quad(high Dose 65+) 05/01/2020  . Influenza Split 01/27/2011  . Influenza Whole 02/26/2009  . Influenza,  High Dose Seasonal PF 01/01/2016, 03/12/2017, 04/07/2018  . Influenza,inj,Quad PF,6+ Mos 12/31/2014  . Influenza-Unspecified 12/28/2013, 01/14/2014  . PFIZER(Purple Top)SARS-COV-2 Vaccination 04/21/2019, 05/12/2019  . Pneumococcal Conjugate-13 12/31/2014  . Pneumococcal Polysaccharide-23 06/28/2005  . Td 04/30/2005  . Tdap 01/01/2016    TDAP status: Up to date  Flu Vaccine status: Up to date  Pneumococcal vaccine status: Up to date  Covid-19 vaccine status: Completed vaccines  Qualifies for Shingles Vaccine? Yes   Zostavax completed Yes   Shingrix Completed?: Yes  Screening Tests Health Maintenance  Topic Date Due  . COVID-19 Vaccine (3 - Pfizer risk 4-dose series) 06/09/2019  . TETANUS/TDAP  12/31/2025  . INFLUENZA VACCINE  Completed  . PNA vac Low Risk Adult  Completed    Health Maintenance  Health Maintenance Due  Topic Date Due  . COVID-19 Vaccine (3 - Pfizer risk 4-dose series) 06/09/2019    Colorectal cancer screening: No longer required.   Lung Cancer Screening: (Low Dose CT Chest recommended if Age 14-80 years, 30 pack-year currently smoking OR have quit w/in 15years.) does not qualify.   Lung Cancer Screening Referral: no  Additional Screening:  Hepatitis C Screening: does not qualify; Completed no  Vision Screening: Recommended annual ophthalmology exams for early detection of glaucoma and other disorders of the eye. Is the patient up to date with their annual eye exam?  Yes  Who is the provider or what is the name of the office in which the patient attends annual eye exams? Clent Jacks, MD. If pt is not established with a provider, would they like to be referred to a provider to establish care? No .   Dental Screening: Recommended annual dental exams for proper oral hygiene  Community Resource Referral / Chronic Care Management: CRR required this visit?  No   CCM required this visit?  No      Plan:     I have personally reviewed and noted  the following in the patient's chart:   . Medical and social history . Use of alcohol, tobacco or illicit drugs  . Current medications and supplements . Functional ability and status . Nutritional status . Physical activity . Advanced directives . List of other physicians . Hospitalizations, surgeries, and ER visits in previous 12 months . Vitals . Screenings to include cognitive, depression, and falls . Referrals and appointments  In addition, I have reviewed and discussed with patient certain preventive protocols, quality metrics, and best practice recommendations. A written personalized care plan for preventive services as well as general preventive health recommendations were provided to patient.     Sheral Flow, LPN   04/04/1094   Nurse Notes: n/a

## 2020-05-23 ENCOUNTER — Other Ambulatory Visit: Payer: Self-pay | Admitting: Neurology

## 2020-05-28 ENCOUNTER — Ambulatory Visit: Payer: Medicare PPO | Admitting: Internal Medicine

## 2020-05-28 ENCOUNTER — Encounter: Payer: Self-pay | Admitting: Internal Medicine

## 2020-05-28 ENCOUNTER — Other Ambulatory Visit: Payer: Self-pay

## 2020-05-28 VITALS — BP 122/80 | HR 54 | Temp 97.8°F | Resp 18 | Ht 69.0 in | Wt 140.0 lb

## 2020-05-28 DIAGNOSIS — R634 Abnormal weight loss: Secondary | ICD-10-CM | POA: Diagnosis not present

## 2020-05-28 DIAGNOSIS — R110 Nausea: Secondary | ICD-10-CM

## 2020-05-28 DIAGNOSIS — E782 Mixed hyperlipidemia: Secondary | ICD-10-CM | POA: Diagnosis not present

## 2020-05-28 MED ORDER — FAMOTIDINE 20 MG PO TABS: 20.0000 mg | ORAL_TABLET | Freq: Every day | ORAL | 1 refills | Status: DC

## 2020-05-28 NOTE — Patient Instructions (Addendum)
We have sent in pepcid to try taking in the evening to see if that helps with the nausea.   We will have you take 1/2 pill of the aricept as this could be the cause of the nausea.

## 2020-05-28 NOTE — Progress Notes (Signed)
   Subjective:   Patient ID: Tommy Browning., male    DOB: February 26, 1930, 87 y.o.   MRN: [id number]  HPI The patient is an 87 YO man coming in for nausea. His wife did notice recently that he had not been taking his aricept and started it back at 10 mg. Since then he has been nauseous at times. Denies vomiting. Does have poor appetite. He continues to lose weight off and on although is within 1 pound of last year's visit weight. Denies trying anything otc for the nausea. Denies reflux type symptoms. Denies fevers or chills this has been present for a month or so without change. Nausea worst in the morning before he has eaten anything.  Review of Systems  Constitutional: Negative.   HENT: Negative.   Eyes: Negative.   Respiratory: Negative for cough, chest tightness and shortness of breath.   Cardiovascular: Negative for chest pain, palpitations and leg swelling.  Gastrointestinal: Positive for nausea. Negative for abdominal distention, abdominal pain, constipation, diarrhea and vomiting.  Musculoskeletal: Negative.   Skin: Negative.   Neurological: Negative.   Psychiatric/Behavioral: Negative.     Objective:  Physical Exam Constitutional:      Appearance: He is well-developed and well-nourished.  HENT:     Head: Normocephalic and atraumatic.  Eyes:     Extraocular Movements: EOM normal.  Cardiovascular:     Rate and Rhythm: Normal rate and regular rhythm.  Pulmonary:     Effort: Pulmonary effort is normal. No respiratory distress.     Breath sounds: Normal breath sounds. No wheezing or rales.  Abdominal:     General: Bowel sounds are normal. There is no distension.     Palpations: Abdomen is soft.     Tenderness: There is no abdominal tenderness. There is no rebound.  Musculoskeletal:        General: No edema.     Cervical back: Normal range of motion.  Skin:    General: Skin is warm and dry.  Neurological:     Mental Status: He is alert and oriented to person, place, and time.  Mental status is at baseline.     Coordination: Coordination normal.  Psychiatric:        Mood and Affect: Mood and affect normal.     Vitals:   05/28/20 1601  BP: 122/80  Pulse: (!) 54  Resp: 18  Temp: 97.8 F (36.6 C)  TempSrc: Oral  SpO2: 98%  Weight: 140 lb (63.5 kg)  Height: 5\' 9"  (1.753 m)    This visit occurred during the SARS-CoV-2 public health emergency.  Safety protocols were in place, including screening questions prior to the visit, additional usage of staff PPE, and extensive cleaning of exam room while observing appropriate contact time as indicated for disinfecting solutions.   Assessment & Plan:

## 2020-05-29 LAB — COMPREHENSIVE METABOLIC PANEL
ALT: 15 U/L (ref 0–53)
AST: 27 U/L (ref 0–37)
Albumin: 4.2 g/dL (ref 3.5–5.2)
Alkaline Phosphatase: 58 U/L (ref 39–117)
BUN: 11 mg/dL (ref 6–23)
CO2: 29 mEq/L (ref 19–32)
Calcium: 9.9 mg/dL (ref 8.4–10.5)
Chloride: 104 mEq/L (ref 96–112)
Creatinine, Ser: 1.14 mg/dL (ref 0.40–1.50)
GFR: 57.34 mL/min — ABNORMAL LOW (ref >60.00)
Glucose, Bld: 79 mg/dL (ref 70–99)
Potassium: 4.9 mEq/L (ref 3.5–5.1)
Sodium: 139 mEq/L (ref 135–145)
Total Bilirubin: 0.7 mg/dL (ref 0.2–1.2)
Total Protein: 7.2 g/dL (ref 6.0–8.3)

## 2020-05-29 LAB — CBC
HCT: 43.3 % (ref 39.0–52.0)
Hemoglobin: 14.7 g/dL (ref 13.0–17.0)
MCHC: 34 g/dL (ref 30.0–36.0)
MCV: 89.7 fl (ref 78.0–100.0)
Platelets: 179 10*3/uL (ref 150.0–400.0)
RBC: 4.83 Mil/uL (ref 4.22–5.81)
RDW: 14 % (ref 11.5–15.5)
WBC: 3.7 10*3/uL — ABNORMAL LOW (ref 4.0–10.5)

## 2020-05-29 LAB — LIPID PANEL
Cholesterol: 180 mg/dL (ref 0–200)
HDL: 65.5 mg/dL (ref >39.00)
LDL Cholesterol: 88 mg/dL (ref 0–99)
NonHDL: 114.5
Total CHOL/HDL Ratio: 3
Triglycerides: 135 mg/dL (ref 0.0–149.0)
VLDL: 27 mg/dL (ref 0.0–40.0)

## 2020-05-30 ENCOUNTER — Encounter: Payer: Self-pay | Admitting: Neurology

## 2020-05-30 ENCOUNTER — Ambulatory Visit: Payer: Medicare PPO | Admitting: Neurology

## 2020-05-30 VITALS — Ht 70.0 in | Wt 142.4 lb

## 2020-05-30 DIAGNOSIS — R413 Other amnesia: Secondary | ICD-10-CM | POA: Diagnosis not present

## 2020-05-30 LAB — VITAMIN B12: Vitamin B-12: 1364 pg/mL — ABNORMAL HIGH (ref 211–911)

## 2020-05-30 LAB — TSH: TSH: 2.1 u[IU]/mL (ref 0.35–4.50)

## 2020-05-30 LAB — VITAMIN D 25 HYDROXY (VIT D DEFICIENCY, FRACTURES): VITD: 78.99 ng/mL (ref 30.00–100.00)

## 2020-05-30 MED ORDER — DONEPEZIL HCL 5 MG PO TABS: 5.0000 mg | ORAL_TABLET | Freq: Every day | ORAL | 3 refills | Status: DC

## 2020-05-30 NOTE — Progress Notes (Signed)
Reason for visit: Memory disturbance, cerebrovascular disease  Tommy Browning. is an 87 y.o. male  History of present illness:  Tommy Browning is an 87 year old right-handed black male with a history of a memory disturbance.  The patient has fairly extensive cerebrovascular disease including a pontine stroke event that has left him with some double vision.  The patient was last seen in September 2019, the wife indicates that there has been very little change in his memory since that time.  He is still doing most things for himself, he is managing his own medications and appointments, he will actually drive a car occasionally but he does very little driving.  He continues to have some problems with appetite, he has lost 6 pounds since last seen.  He has nausea on occasion.  He is on Aricept 10 mg at night.  The patient is sleeping fairly well, he denies any hallucinations or vivid dreams.  He does have some mild gait instability, he has not had any falls.  He returns for further evaluation.  Past Medical History:  Diagnosis Date  . BACK PAIN 02/24/2008  . Diplopia   . Diverticulosis   . ELECTROCARDIOGRAM, ABNORMAL 12/08/2006   F/U by a normal stress test on September 2005  . ERECTILE DYSFUNCTION, ORGANIC 05/30/2009  . History of thyroid nodule    Resolved by [place] 11/2006  . [place], MILD 05/30/2009  . HYPERTHYROIDISM 12/08/2006   [place]  . HYPERTROPHY PROSTATE W/UR OBST & OTH LUTS 02/26/2009  . HYPOGONADISM 02/03/2010  . Ischemic colitis ([place])   . Tubular adenoma of colon 2004    Past Surgical History:  Procedure Laterality Date  . COLONOSCOPY    . FLEXIBLE SIGMOIDOSCOPY N/A 03/08/2015   Procedure: FLEXIBLE SIGMOIDOSCOPY;  Surgeon: Ladene Artist, MD;  Location: [facility] ENDOSCOPY;  Service: Endoscopy;  Laterality: N/A;    Family History  Problem Relation Age of Onset  . Colon cancer Sister 37  . Stroke Other        GP  . Diabetes Neg Hx   . Thyroid disease Neg Hx        no goiter or  other thyroid problem  . Stomach cancer Neg Hx   . Prostate cancer Neg Hx     Social history:  reports that he has quit smoking. He has never used smokeless tobacco. He reports current alcohol use. He reports that he does not use drugs.   No Known Allergies  Medications:  Prior to Admission medications   Medication Sig Start Date End Date Taking? Authorizing Provider  aspirin 81 MG tablet Take 81 mg by mouth daily.    [provider]  carboxymethylcellulose (REFRESH PLUS) 0.5 % SOLN Place 1 drop into both eyes 4 (four) times daily - after meals and at bedtime.    [provider]  cyanocobalamin 1000 MCG tablet Take 1,000 mcg by mouth daily.    [provider]  donepezil (ARICEPT) 10 MG tablet Take 1 tablet (10 mg total) by mouth every morning. 04/30/20   Kathrynn Ducking, MD  dorzolamide-timolol (COSOPT) 22.3-6.8 MG/ML ophthalmic solution Place 1 drop into both eyes every 12 (twelve) hours. 10/14/11   [provider]  famotidine (PEPCID) 20 MG tablet Take 1 tablet (20 mg total) by mouth at bedtime. 05/28/20   Hoyt Koch, MD  fish oil-omega-3 fatty acids 1000 MG capsule Take 2 g by mouth daily.    [provider]  Multiple Vitamin (MULTIVITAMIN) tablet Take 1  tablet by mouth daily.    [provider]  rosuvastatin (CRESTOR) 10 MG tablet TAKE 1 TABLET BY MOUTH EVERY DAY 06/20/19   Hoyt Koch, MD    ROS:  Out of a complete 14 system review of symptoms, the patient complains only of the following symptoms, and all other reviewed systems are negative.  Memory problems Double vision Walking difficulty  Height 5\' 10"  (1.778 m), weight 142 lb 6.4 oz (64.6 kg).  Physical Exam  General: The patient is alert and cooperative at the time of the examination.  Skin: No significant peripheral edema is noted.   Neurologic Exam  Mental status: The patient is alert and oriented x 3 at the time of the examination. The  Mini-Mental status examination done today shows a total score 22/30.   Cranial nerves: Facial symmetry is present. Speech is normal, no aphasia or dysarthria is noted. Extraocular movements are full.  On primary gaze, there is slight medial deviation of the right eye.  Visual fields are full.  Motor: The patient has good strength in all 4 extremities.  Sensory examination: Soft touch sensation is symmetric on the face, arms, and legs.  Coordination: The patient has good finger-nose-finger and heel-to-shin bilaterally.  Mild apraxia with the use of the lower extremities is noted.  Gait and station: The patient has a normal gait. Tandem gait is unsteady. Romberg is negative. No drift is seen.  Reflexes: Deep tendon reflexes are symmetric.   MRI brain 06/19/16:  IMPRESSION:  This MRI of the brain without contrast shows the following: 1.    Moderate cortical atrophy that has progressed when compared to the 08/12/2010 MRI. 2.    Chronic microvascular ischemic change involving the pons and hemispheres that has progressed when compared to the 08/12/2010 MRI. 3.    Acute and chronic maxillary sinusitis and mild chronic ethmoid sinusitis. 4.    There are no acute findings of the brain.  * MRI scan images were reviewed online. I agree with the written report.   Assessment/Plan:  1.  Cerebrovascular disease  2.  Memory disturbance  3.  Weight loss  The patient will cut back on the Aricept to a 5 mg nightly dose, a prescription was sent in.  Hopefully this will help his nausea and improve appetite.  He seems to be progressing very slowly with his memory.  He will follow up here in 8 months.  Jill Alexanders MD 05/30/2020 8:28 AM  [facility] [address] [place], [place] [address]  Phone [phone] Fax [phone]

## 2020-05-30 NOTE — Patient Instructions (Signed)
We will reduce the aricept (donepezil) to 5 mg at night.

## 2020-05-31 DIAGNOSIS — R110 Nausea: Secondary | ICD-10-CM | POA: Insufficient documentation

## 2020-05-31 NOTE — Assessment & Plan Note (Signed)
Could be related to aricept so asked to take 1/2 pill daily instead. Rx for pepcid to take in the evening if this does not help.

## 2020-06-13 DIAGNOSIS — I951 Orthostatic hypotension: Secondary | ICD-10-CM | POA: Diagnosis not present

## 2020-06-13 DIAGNOSIS — Z8249 Family history of ischemic heart disease and other diseases of the circulatory system: Secondary | ICD-10-CM | POA: Diagnosis not present

## 2020-06-13 DIAGNOSIS — F0390 Unspecified dementia without behavioral disturbance: Secondary | ICD-10-CM | POA: Diagnosis not present

## 2020-06-13 DIAGNOSIS — E785 Hyperlipidemia, unspecified: Secondary | ICD-10-CM | POA: Diagnosis not present

## 2020-06-13 DIAGNOSIS — Z8546 Personal history of malignant neoplasm of prostate: Secondary | ICD-10-CM | POA: Diagnosis not present

## 2020-06-13 DIAGNOSIS — H409 Unspecified glaucoma: Secondary | ICD-10-CM | POA: Diagnosis not present

## 2020-06-13 DIAGNOSIS — R030 Elevated blood-pressure reading, without diagnosis of hypertension: Secondary | ICD-10-CM | POA: Diagnosis not present

## 2020-06-13 DIAGNOSIS — K219 Gastro-esophageal reflux disease without esophagitis: Secondary | ICD-10-CM | POA: Diagnosis not present

## 2020-06-13 DIAGNOSIS — Z823 Family history of stroke: Secondary | ICD-10-CM | POA: Diagnosis not present

## 2020-08-01 ENCOUNTER — Other Ambulatory Visit: Payer: Self-pay | Admitting: Internal Medicine

## 2020-09-10 DIAGNOSIS — C61 Malignant neoplasm of prostate: Secondary | ICD-10-CM | POA: Diagnosis not present

## 2020-09-17 DIAGNOSIS — C61 Malignant neoplasm of prostate: Secondary | ICD-10-CM | POA: Diagnosis not present

## 2020-12-16 ENCOUNTER — Other Ambulatory Visit: Payer: Self-pay | Admitting: Neurology

## 2020-12-25 ENCOUNTER — Other Ambulatory Visit: Payer: Self-pay

## 2020-12-25 ENCOUNTER — Ambulatory Visit (INDEPENDENT_AMBULATORY_CARE_PROVIDER_SITE_OTHER): Payer: Medicare PPO

## 2020-12-25 DIAGNOSIS — Z23 Encounter for immunization: Secondary | ICD-10-CM | POA: Diagnosis not present

## 2020-12-31 DIAGNOSIS — C61 Malignant neoplasm of prostate: Secondary | ICD-10-CM | POA: Diagnosis not present

## 2021-01-07 DIAGNOSIS — C61 Malignant neoplasm of prostate: Secondary | ICD-10-CM | POA: Diagnosis not present

## 2021-01-30 ENCOUNTER — Ambulatory Visit: Payer: Medicare PPO

## 2021-01-31 ENCOUNTER — Other Ambulatory Visit: Payer: Self-pay

## 2021-01-31 ENCOUNTER — Encounter: Payer: Self-pay | Admitting: Internal Medicine

## 2021-01-31 ENCOUNTER — Ambulatory Visit: Payer: Medicare PPO | Admitting: Internal Medicine

## 2021-01-31 DIAGNOSIS — R413 Other amnesia: Secondary | ICD-10-CM | POA: Diagnosis not present

## 2021-01-31 DIAGNOSIS — E782 Mixed hyperlipidemia: Secondary | ICD-10-CM | POA: Diagnosis not present

## 2021-01-31 MED ORDER — ROSUVASTATIN CALCIUM 10 MG PO TABS: 10.0000 mg | ORAL_TABLET | Freq: Every day | ORAL | 3 refills | Status: DC

## 2021-01-31 NOTE — Progress Notes (Signed)
   Subjective:   Patient ID: Tommy Luke., male    DOB: 07/28/1929, 87 y.o.   MRN: [id number]  HPI The patient is an 87 YO man coming in for follow up. Not taking crestor or aricept.  Review of Systems  Constitutional: Negative.   HENT: Negative.    Eyes: Negative.   Respiratory:  Negative for cough, chest tightness and shortness of breath.   Cardiovascular:  Negative for chest pain, palpitations and leg swelling.  Gastrointestinal:  Negative for abdominal distention, abdominal pain, constipation, diarrhea, nausea and vomiting.  Musculoskeletal: Negative.   Skin: Negative.   Neurological: Negative.   Psychiatric/Behavioral: Negative.     Objective:  Physical Exam Constitutional:      Appearance: He is well-developed.  HENT:     Head: Normocephalic and atraumatic.  Cardiovascular:     Rate and Rhythm: Normal rate and regular rhythm.  Pulmonary:     Effort: Pulmonary effort is normal. No respiratory distress.     Breath sounds: Normal breath sounds. No wheezing or rales.  Abdominal:     General: Bowel sounds are normal. There is no distension.     Palpations: Abdomen is soft.     Tenderness: There is no abdominal tenderness. There is no rebound.  Musculoskeletal:     Cervical back: Normal range of motion.  Skin:    General: Skin is warm and dry.  Neurological:     Mental Status: He is alert and oriented to person, place, and time.     Coordination: Coordination normal.    Vitals:   01/31/21 1404  BP: 118/80  Pulse: 79  Resp: 18  SpO2: 99%  Weight: 150 lb 12.8 oz (68.4 kg)  Height: 5\' 10"  (1.778 m)    This visit occurred during the SARS-CoV-2 public health emergency.  Safety protocols were in place, including screening questions prior to the visit, additional usage of staff PPE, and extensive cleaning of exam room while observing appropriate contact time as indicated for disinfecting solutions.   Assessment & Plan:

## 2021-01-31 NOTE — Assessment & Plan Note (Signed)
Not taking aricept and his wife thinks memory is worsening so asked him to resume aricept 5 mg daily.

## 2021-01-31 NOTE — Assessment & Plan Note (Signed)
Not taking crestor 10 mg so asked to resume. Refilled.

## 2021-01-31 NOTE — Patient Instructions (Addendum)
We have restarted the cholesterol medicine crestor (rosuvastatin).   I would recommend to restart the aricept (donepezil) for the memory to stabilize.

## 2021-02-13 ENCOUNTER — Ambulatory Visit: Payer: Medicare PPO | Admitting: Neurology

## 2021-02-27 ENCOUNTER — Ambulatory Visit: Payer: Medicare PPO | Admitting: Adult Health

## 2021-04-01 DIAGNOSIS — C61 Malignant neoplasm of prostate: Secondary | ICD-10-CM | POA: Diagnosis not present

## 2021-04-04 DIAGNOSIS — K5710 Diverticulosis of small intestine without perforation or abscess without bleeding: Secondary | ICD-10-CM | POA: Diagnosis not present

## 2021-04-04 DIAGNOSIS — C61 Malignant neoplasm of prostate: Secondary | ICD-10-CM | POA: Diagnosis not present

## 2021-04-04 DIAGNOSIS — R911 Solitary pulmonary nodule: Secondary | ICD-10-CM | POA: Diagnosis not present

## 2021-04-07 DIAGNOSIS — C61 Malignant neoplasm of prostate: Secondary | ICD-10-CM | POA: Diagnosis not present

## 2021-05-02 ENCOUNTER — Other Ambulatory Visit: Payer: Self-pay

## 2021-05-02 ENCOUNTER — Ambulatory Visit (INDEPENDENT_AMBULATORY_CARE_PROVIDER_SITE_OTHER): Payer: Medicare PPO

## 2021-05-02 DIAGNOSIS — Z0000 Encounter for general adult medical examination without abnormal findings: Secondary | ICD-10-CM | POA: Diagnosis not present

## 2021-05-02 NOTE — Patient Instructions (Signed)
Tommy Browning , Thank you for taking time to come for your Medicare Wellness Visit. I appreciate your ongoing commitment to your health goals. Please review the following plan we discussed and let me know if I can assist you in the future.   Screening recommendations/referrals: Colonoscopy: Not a candidate for screening due to age Recommended yearly ophthalmology/optometry visit for glaucoma screening and checkup Recommended yearly dental visit for hygiene and checkup  Vaccinations: Influenza vaccine: 12/25/2020; due every Fall season Pneumococcal vaccine: 06/28/2005, 12/31/2014 Tdap vaccine: 01/01/2016; due every 10 years (due 12/31/2025) Shingles vaccine: 03/01/2019, 06/27/2019   Covid-19: 04/21/2019, 05/12/2019, 02/01/2021, 03/01/2021  Advanced directives: Please bring a copy of your health care power of attorney and living will to the office at your convenience.  Conditions/risks identified: Yes; Patient declined health goal at this time.  Next appointment: scheduled for 05/04/2022 at 8:40 a.m. telephone visit with Mignon Pine, [facility], LPN.  Preventive Care 83 Years and Older, Male Preventive care refers to lifestyle choices and visits with your health care provider that can promote health and wellness. What does preventive care include? A yearly physical exam. This is also called an annual well check. Dental exams once or twice a year. Routine eye exams. Ask your health care provider how often you should have your eyes checked. Personal lifestyle choices, including: Daily care of your teeth and gums. Regular physical activity. Eating a healthy diet. Avoiding tobacco and drug use. Limiting alcohol use. Practicing safe sex. Taking low doses of aspirin every day. Taking vitamin and mineral supplements as recommended by your health care provider. What happens during an annual well check? The services and screenings done by your health care provider during your annual well check will depend on your age,  overall health, lifestyle risk factors, and family history of disease. Counseling  Your health care provider may ask you questions about your: Alcohol use. Tobacco use. Drug use. Emotional well-being. Home and relationship well-being. Sexual activity. Eating habits. History of falls. Memory and ability to understand (cognition). Work and work Statistician. Screening  You may have the following tests or measurements: Height, weight, and BMI. Blood pressure. Lipid and cholesterol levels. These may be checked every 5 years, or more frequently if you are over 29 years old. Skin check. Lung cancer screening. You may have this screening every year starting at age 24 if you have a 30-pack-year history of smoking and currently smoke or have quit within the past 15 years. Fecal occult blood test (FOBT) of the stool. You may have this test every year starting at age 60. Flexible sigmoidoscopy or colonoscopy. You may have a sigmoidoscopy every 5 years or a colonoscopy every 10 years starting at age 45. Prostate cancer screening. Recommendations will vary depending on your family history and other risks. Hepatitis C blood test. Hepatitis B blood test. Sexually transmitted disease (STD) testing. Diabetes screening. This is done by checking your blood sugar (glucose) after you have not eaten for a while (fasting). You may have this done every 1-3 years. Abdominal aortic aneurysm (AAA) screening. You may need this if you are a current or former smoker. Osteoporosis. You may be screened starting at age 63 if you are at high risk. Talk with your health care provider about your test results, treatment options, and if necessary, the need for more tests. Vaccines  Your health care provider may recommend certain vaccines, such as: Influenza vaccine. This is recommended every year. Tetanus, diphtheria, and acellular pertussis (Tdap, Td) vaccine. You may need a  Td booster every 10 years. Zoster vaccine.  You may need this after age 35. Pneumococcal 13-valent conjugate (PCV13) vaccine. One dose is recommended after age 44. Pneumococcal polysaccharide (PPSV23) vaccine. One dose is recommended after age 44. Talk to your health care provider about which screenings and vaccines you need and how often you need them. This information is not intended to replace advice given to you by your health care provider. Make sure you discuss any questions you have with your health care provider. Document Released: 04/12/2015 Document Revised: 12/04/2015 Document Reviewed: 01/15/2015 Elsevier Interactive Patient Education  2017 [place] Prevention in the Home Falls can cause injuries. They can happen to people of all ages. There are many things you can do to make your home safe and to help prevent falls. What can I do on the outside of my home? Regularly fix the edges of walkways and driveways and fix any cracks. Remove anything that might make you trip as you walk through a door, such as a raised step or threshold. Trim any bushes or trees on the path to your home. Use bright outdoor lighting. Clear any walking paths of anything that might make someone trip, such as rocks or tools. Regularly check to see if handrails are loose or broken. Make sure that both sides of any steps have handrails. Any raised decks and porches should have guardrails on the edges. Have any leaves, snow, or ice cleared regularly. Use sand or salt on walking paths during winter. Clean up any spills in your garage right away. This includes oil or grease spills. What can I do in the bathroom? Use night lights. Install grab bars by the toilet and in the tub and shower. Do not use towel bars as grab bars. Use non-skid mats or decals in the tub or shower. If you need to sit down in the shower, use a plastic, non-slip stool. Keep the floor dry. Clean up any water that spills on the floor as soon as it happens. Remove soap  buildup in the tub or shower regularly. Attach bath mats securely with double-sided non-slip rug tape. Do not have throw rugs and other things on the floor that can make you trip. What can I do in the bedroom? Use night lights. Make sure that you have a light by your bed that is easy to reach. Do not use any sheets or blankets that are too big for your bed. They should not hang down onto the floor. Have a firm chair that has side arms. You can use this for support while you get dressed. Do not have throw rugs and other things on the floor that can make you trip. What can I do in the kitchen? Clean up any spills right away. Avoid walking on wet floors. Keep items that you use a lot in easy-to-reach places. If you need to reach something above you, use a strong step stool that has a grab bar. Keep electrical cords out of the way. Do not use floor polish or wax that makes floors slippery. If you must use wax, use non-skid floor wax. Do not have throw rugs and other things on the floor that can make you trip. What can I do with my stairs? Do not leave any items on the stairs. Make sure that there are handrails on both sides of the stairs and use them. Fix handrails that are broken or loose. Make sure that handrails are as long as the stairways. Check any  carpeting to make sure that it is firmly attached to the stairs. Fix any carpet that is loose or worn. Avoid having throw rugs at the top or bottom of the stairs. If you do have throw rugs, attach them to the floor with carpet tape. Make sure that you have a light switch at the top of the stairs and the bottom of the stairs. If you do not have them, ask someone to add them for you. What else can I do to help prevent falls? Wear shoes that: Do not have high heels. Have rubber bottoms. Are comfortable and fit you well. Are closed at the toe. Do not wear sandals. If you use a stepladder: Make sure that it is fully opened. Do not climb a closed  stepladder. Make sure that both sides of the stepladder are locked into place. Ask someone to hold it for you, if possible. Clearly mark and make sure that you can see: Any grab bars or handrails. First and last steps. Where the edge of each step is. Use tools that help you move around (mobility aids) if they are needed. These include: Canes. Walkers. Scooters. Crutches. Turn on the lights when you go into a dark area. Replace any light bulbs as soon as they burn out. Set up your furniture so you have a clear path. Avoid moving your furniture around. If any of your floors are uneven, fix them. If there are any pets around you, be aware of where they are. Review your medicines with your doctor. Some medicines can make you feel dizzy. This can increase your chance of falling. Ask your doctor what other things that you can do to help prevent falls. This information is not intended to replace advice given to you by your health care provider. Make sure you discuss any questions you have with your health care provider. Document Released: 01/10/2009 Document Revised: 08/22/2015 Document Reviewed: 04/20/2014 Elsevier Interactive Patient Education  2017 [employer].

## 2021-05-02 NOTE — Progress Notes (Signed)
I connected with Tommy Browning, Tommy Browning today by telephone and verified that I am speaking with the correct person using two identifiers. Location patient: home Location provider: work Persons participating in the virtual visit: patient, provider.   I discussed the limitations, risks, security and privacy concerns of performing an evaluation and management service by telephone and the availability of in person appointments. I also discussed with the patient that there may be a patient responsible charge related to this service. The patient expressed understanding and verbally consented to this telephonic visit.    Interactive audio and video telecommunications were attempted between this provider and patient, however failed, due to patient having technical difficulties OR patient did not have access to video capability.  We continued and completed visit with audio only.  Some vital signs may be absent or patient reported.   Time Spent with patient on telephone encounter: 40 minutes  Subjective:   Tommy Browning. is a 88 y.o. male who presents for Medicare Annual/Subsequent preventive examination.  Review of Systems     Cardiac Risk Factors include: advanced age (>21men, >50 women);dyslipidemia;male gender     Objective:    There were no vitals filed for this visit. There is no height or weight on file to calculate BMI.  Advanced Directives 05/02/2021 05/01/2020 02/23/2017 03/08/2015 03/07/2015 01/08/2015 01/07/2015  Does Patient Have a Medical Advance Directive? Yes Yes No No No No No  Type of Advance Directive Living will;Healthcare Power of [place];Living will - - - - -  Does patient want to make changes to medical advance directive? No - Patient declined No - Patient declined - - - - -  Copy of [place] in Chart? No - copy requested No - copy requested - - - - -  Would patient like information on creating a medical advance directive? - - No -  Patient declined - - No - patient declined information Yes - Educational materials given    Current Medications (verified) Outpatient Encounter Medications as of 05/02/2021  Medication Sig   aspirin 81 MG tablet Take 81 mg by mouth daily.   carboxymethylcellulose (REFRESH PLUS) 0.5 % SOLN Place 1 drop into both eyes 4 (four) times daily - after meals and at bedtime.   cyanocobalamin 1000 MCG tablet Take 1,000 mcg by mouth daily.   donepezil (ARICEPT) 5 MG tablet Take 1 tablet (5 mg total) by mouth at bedtime.   dorzolamide-timolol (COSOPT) 22.3-6.8 MG/ML ophthalmic solution Place 1 drop into both eyes every 12 (twelve) hours.   famotidine (PEPCID) 20 MG tablet Take 1 tablet (20 mg total) by mouth at bedtime.   fish oil-omega-3 fatty acids 1000 MG capsule Take 2 g by mouth daily.   Multiple Vitamin (MULTIVITAMIN) tablet Take 1 tablet by mouth daily.   rosuvastatin (CRESTOR) 10 MG tablet Take 1 tablet (10 mg total) by mouth daily.   No facility-administered encounter medications on file as of 05/02/2021.    Allergies (verified) Patient has no known allergies.   History: Past Medical History:  Diagnosis Date   BACK PAIN 02/24/2008   Diplopia    Diverticulosis    ELECTROCARDIOGRAM, ABNORMAL 12/08/2006   F/U by a normal stress test on September 2005   ERECTILE DYSFUNCTION, ORGANIC 05/30/2009   History of thyroid nodule    Resolved by [place] 11/2006   HYPERLIPIDEMIA, MILD 05/30/2009   HYPERTHYROIDISM 12/08/2006   [place]   HYPERTROPHY PROSTATE W/UR OBST & OTH LUTS 02/26/2009  HYPOGONADISM 02/03/2010   Ischemic colitis ([place])    Tubular adenoma of colon 2004   Past Surgical History:  Procedure Laterality Date   COLONOSCOPY     FLEXIBLE SIGMOIDOSCOPY N/A 03/08/2015   Procedure: FLEXIBLE SIGMOIDOSCOPY;  Surgeon: Ladene Artist, MD;  Location: [facility] ENDOSCOPY;  Service: Endoscopy;  Laterality: N/A;   Family History  Problem Relation Age of Onset   Colon cancer Sister 48   Stroke Other        GP    Diabetes Neg Hx    Thyroid disease Neg Hx        no goiter or other thyroid problem   Stomach cancer Neg Hx    Prostate cancer Neg Hx    Social History   Socioeconomic History   Marital status: Married    Spouse name: [name]   Number of children: 2   Years of education: 19   Highest education level: Not on file  Occupational History   Occupation: Works part time, 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  Tobacco Use   Smoking status: Former   Smokeless tobacco: Never   Tobacco comments:    1-2 cigarettes per day  Substance and Sexual Activity   Alcohol use: Yes    Comment: very rare   Drug use: No   Sexual activity: Not on file    Comment: casual smoker  Other Topics Concern   Not on file  Social History Narrative   Lives with wife   Works part time, 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)   Drinks 2-3 cups caffeine daily       Social Determinants of Radio broadcast assistant Strain: Low Risk    Difficulty of Paying Living Expenses: Not hard at all  Food Insecurity: No Food Insecurity   Worried About Charity fundraiser in the Last Year: Never true   Arboriculturist in the Last Year: Never true  Transportation Needs: No Transportation Needs   Lack of Transportation (Medical): No   Lack of Transportation (Non-Medical): No  Physical Activity: Sufficiently Active   Days of Exercise per Week: 5 days   Minutes of Exercise per Session: 30 min  Stress: No Stress Concern Present   Feeling of Stress : Not at all  Social Connections: Socially Isolated   Frequency of Communication with Friends and Family: Once a week   Frequency of Social Gatherings with Friends and Family: Never   Attends Religious Services: Never   Printmaker: No   Attends Music therapist: Never   Marital Status: Married    Tobacco Counseling Counseling given: Not Answered Tobacco comments: 1-2 cigarettes per  day   Clinical Intake:  Pre-visit preparation completed: No  Pain : No/denies pain     Nutritional Risks: None Diabetes: No  How often do you need to have someone help you when you read instructions, pamphlets, or other written materials from your doctor or pharmacy?: 1 - Never What is the last grade level you completed in school?: Master's Degree  Diabetic? no  Interpreter Needed?: No  Information entered by :: Lisette Abu, LPN   Activities of Daily Living In your present state of health, do you have any difficulty performing the following activities: 05/02/2021  Hearing? N  Vision? N  Difficulty concentrating or making decisions? Y  Walking or climbing stairs? N  Dressing or bathing?  N  Doing errands, shopping? Y  Preparing Food and eating ? N  Using the Toilet? N  In the past six months, have you accidently leaked urine? N  Do you have problems with loss of bowel control? N  Managing your Medications? N  Managing your Finances? N  Housekeeping or managing your Housekeeping? N  Some recent data might be hidden    Patient Care Team: Hoyt Koch, MD as PCP - General (Internal Medicine) Meryl Crutch Vickki Muff, MD as Referring Physician (Ophthalmology) Arta Bruce, OD as Referring Physician (Ophthalmology) Kathrynn Ducking, MD (Inactive) as Consulting Physician (Neurology) Renato Shin, MD as Consulting Physician (Endocrinology) Clent Jacks, MD as Consulting Physician (Ophthalmology) [facility]. as Consulting Physician (Neurology) [facility], [facility] as Consulting Physician  Indicate any recent Medical Services you may have received from other than [facility] providers in the past year (date may be approximate).     Assessment:   This is a routine wellness examination for [place].  Hearing/Vision screen Hearing Screening - Comments:: Patient denied any hearing difficulty.   No hearing aids.  Vision  Screening - Comments:: Patient wears reading glasses.  Eye exam done annually by: Clent Jacks, MD.  Dietary issues and exercise activities discussed: Current Exercise Habits: Home exercise routine, Type of exercise: walking, Time (Minutes): 30, Frequency (Times/Week): 5, Weekly Exercise (Minutes/Week): 150, Intensity: Moderate, Exercise limited by: None identified   Goals Addressed               This Visit's Progress     Patient Stated (pt-stated)        Patient declined health goal at this time.      Depression Screen PHQ 2/9 Scores 05/02/2021 05/01/2020 04/11/2019 04/07/2018 02/23/2017 03/20/2015 12/28/2012  PHQ - 2 Score 0 0 0 1 2 2  0  PHQ- 9 Score - - - - 6 11 -    Fall Risk Fall Risk  05/02/2021 05/01/2020 04/11/2019 04/07/2018 02/23/2017  Falls in the past year? 0 0 0 0 No  Comment - - - - -  Number falls in past yr: 0 0 - - -  Injury with Fall? 0 0 - - -  Risk for fall due to : No Fall Risks No Fall Risks - - Impaired vision;Impaired balance/gait  Follow up Falls evaluation completed Falls evaluation completed;Education provided - - -    FALL RISK PREVENTION PERTAINING TO THE HOME:  Any stairs in or around the home? Yes  If so, are there any without handrails? No  Home free of loose throw rugs in walkways, pet beds, electrical cords, etc? Yes  Adequate lighting in your home to reduce risk of falls? Yes   ASSISTIVE DEVICES UTILIZED TO PREVENT FALLS:  Life alert? No  Use of a cane, walker or w/c? No  Grab bars in the bathroom? Yes  Shower chair or bench in shower? No  Elevated toilet seat or a handicapped toilet? No   TIMED UP AND GO:  Was the test performed? No .  Length of time to ambulate 10 feet: n/a sec.   Cognitive Function:  Yes, Patient has current diagnosis of cognitive impairment. Yes, Patient is followed by neurology for ongoing assessment. Yes, Patient is unable to complete screening 6CIT or MMSE.   MMSE - Mini Mental State Exam 05/30/2020 12/01/2017 05/31/2017  02/23/2017 12/01/2016  Orientation to time 3 4 4 4 4   Orientation to Place 5 5 4 5 5   Registration 3 3 3  3  3  Attention/ Calculation 3 4 3 4 1   Recall 0 0 0 0 2  Language- name 2 objects 2 2 2 2 2   Language- repeat 0 1 0 1 1  Language- follow 3 step command 3 3 3 3 3   Language- read & follow direction 1 1 0 1 1  Write a sentence 1 1 0 1 1  Copy design 1 0 1 1 1   Total score 22 24 20 25 24         Immunizations Immunization History  Administered Date(s) Administered   Fluad Quad(high Dose 65+) 05/01/2020, 12/25/2020   Influenza Split 01/27/2011   Influenza Whole 02/26/2009   Influenza, High Dose Seasonal PF 01/01/2016, 03/12/2017, 04/07/2018   Influenza,inj,Quad PF,6+ Mos 12/31/2014   Influenza-Unspecified 12/28/2013, 01/14/2014   PFIZER(Purple Top)SARS-COV-2 Vaccination 04/21/2019, 05/12/2019, 02/01/2021   Pfizer Covid-19 Vaccine Bivalent Booster 18yrs & up 03/01/2021   Pneumococcal Conjugate-13 12/31/2014   Pneumococcal Polysaccharide-23 06/28/2005   Td 04/30/2005   Tdap 01/01/2016   Zoster Recombinat (Shingrix) 03/01/2019, 06/27/2019    TDAP status: Up to date  Flu Vaccine status: Up to date  Pneumococcal vaccine status: Up to date  Covid-19 vaccine status: Completed vaccines  Qualifies for Shingles Vaccine? Yes   Zostavax completed No   Shingrix Completed?: Yes  Screening Tests Health Maintenance  Topic Date Due   COVID-19 Vaccine (4 - Booster for Pfizer series) 04/26/2021   TETANUS/TDAP  12/31/2025   Pneumonia Vaccine 1+ Years old  Completed   INFLUENZA VACCINE  Completed   Zoster Vaccines- Shingrix  Completed   HPV VACCINES  Aged Out    Health Maintenance  Health Maintenance Due  Topic Date Due   COVID-19 Vaccine (4 - Booster for Pfizer series) 04/26/2021    Colorectal cancer screening: No longer required.   Lung Cancer Screening: (Low Dose CT Chest recommended if Age 26-80 years, 30 pack-year currently smoking OR have quit w/in 15years.) does not  qualify.   Lung Cancer Screening Referral: no  Additional Screening:  Hepatitis C Screening: does not qualify; Completed no  Vision Screening: Recommended annual ophthalmology exams for early detection of glaucoma and other disorders of the eye. Is the patient up to date with their annual eye exam?  Yes  Who is the provider or what is the name of the office in which the patient attends annual eye exams? Clent Jacks, MD. If pt is not established with a provider, would they like to be referred to a provider to establish care? No .   Dental Screening: Recommended annual dental exams for proper oral hygiene  Community Resource Referral / Chronic Care Management: CRR required this visit?  No   CCM required this visit?  No      Plan:     I have personally reviewed and noted the following in the patients chart:   Medical and social history Use of alcohol, tobacco or illicit drugs  Current medications and supplements including opioid prescriptions. Patient is not currently taking opioid prescriptions. Functional ability and status Nutritional status Physical activity Advanced directives List of other physicians Hospitalizations, surgeries, and ER visits in previous 12 months Vitals Screenings to include cognitive, depression, and falls Referrals and appointments  In addition, I have reviewed and discussed with patient certain preventive protocols, quality metrics, and best practice recommendations. A written personalized care plan for preventive services as well as general preventive health recommendations were provided to patient.     Sheral Flow, LPN   12/01/8544  Nurse Notes:  There were no vitals filed for this visit. There is no height or weight on file to calculate BMI.

## 2021-06-16 ENCOUNTER — Ambulatory Visit: Payer: Medicare PPO | Admitting: Adult Health

## 2021-06-16 ENCOUNTER — Encounter: Payer: Self-pay | Admitting: Adult Health

## 2021-09-24 ENCOUNTER — Encounter: Payer: Self-pay | Admitting: Adult Health

## 2021-09-24 ENCOUNTER — Ambulatory Visit: Payer: Medicare PPO | Admitting: Adult Health

## 2021-09-24 VITALS — BP 124/86 | HR 72 | Ht 70.0 in | Wt 152.8 lb

## 2021-09-24 DIAGNOSIS — R413 Other amnesia: Secondary | ICD-10-CM | POA: Diagnosis not present

## 2021-09-24 NOTE — Progress Notes (Signed)
PATIENT: Tommy Browning. DOB: June 24, 1929  REASON FOR VISIT: follow up HISTORY FROM: patient PRIMARY NEUROLOGIST: Dr. Jannifer Franklin  Chief Complaint  Patient presents with   Follow-up    Pt in 70 with wife  Pt here for memory follow up  Pt  and wife state no change since last visit  Wife states patient refuse to take Aricept      HISTORY OF PRESENT ILLNESS: Today 09/24/21: Tommy Browning is a 88 year old male with a history of memory disturbance.  He returns today for follow-up.  The patient is here with his wife.  She reports that he will not take Aricept. Wife reports that his memory varies. She states that he went through a period he was agitated easily but now that's not the case. She does notice trouble with short term memory.  He lives at home with her.  He is able to complete all ADLs independently. No longer operates a motor vehicle d/t vision. Manages his own medications, appointments. He chose to stop all prescription medications only taking vitamins.   HISTORY Tommy Browning is an 88 year old right-handed black male with a history of a memory disturbance.  The patient has fairly extensive cerebrovascular disease including a pontine stroke event that has left him with some double vision.  The patient was last seen in September 2019, the wife indicates that there has been very little change in his memory since that time.  He is still doing most things for himself, he is managing his own medications and appointments, he will actually drive a car occasionally but he does very little driving.  He continues to have some problems with appetite, he has lost 6 pounds since last seen.  He has nausea on occasion.  He is on Aricept 10 mg at night.  The patient is sleeping fairly well, he denies any hallucinations or vivid dreams.  He does have some mild gait instability, he has not had any falls.  He returns for further evaluation.   REVIEW OF SYSTEMS: Out of a complete 14 system review of symptoms, the patient  complains only of the following symptoms, and all other reviewed systems are negative.  ALLERGIES: No Known Allergies  HOME MEDICATIONS: Outpatient Medications Prior to Visit  Medication Sig Dispense Refill   aspirin 81 MG tablet Take 81 mg by mouth daily.     cyanocobalamin 1000 MCG tablet Take 1,000 mcg by mouth daily.     dorzolamide-timolol (COSOPT) 22.3-6.8 MG/ML ophthalmic solution Place 1 drop into both eyes every 12 (twelve) hours.     fish oil-omega-3 fatty acids 1000 MG capsule Take 2 g by mouth daily.     Multiple Vitamin (MULTIVITAMIN) tablet Take 1 tablet by mouth daily.     Multiple Vitamins-Minerals (CENTRUM MEN PO) Take by mouth.     UNABLE TO FIND Med Name: Ageless Brain     carboxymethylcellulose (REFRESH PLUS) 0.5 % SOLN Place 1 drop into both eyes 4 (four) times daily - after meals and at bedtime. (Patient not taking: Reported on 09/24/2021)     donepezil (ARICEPT) 5 MG tablet Take 1 tablet (5 mg total) by mouth at bedtime. (Patient not taking: Reported on 09/24/2021) 90 tablet 3   famotidine (PEPCID) 20 MG tablet Take 1 tablet (20 mg total) by mouth at bedtime. (Patient not taking: Reported on 09/24/2021) 90 tablet 1   rosuvastatin (CRESTOR) 10 MG tablet Take 1 tablet (10 mg total) by mouth daily. (Patient not taking: Reported on  09/24/2021) 90 tablet 3   No facility-administered medications prior to visit.    PAST MEDICAL HISTORY: Past Medical History:  Diagnosis Date   BACK PAIN 02/24/2008   Diplopia    Diverticulosis    ELECTROCARDIOGRAM, ABNORMAL 12/08/2006   F/U by a normal stress test on September 2005   ERECTILE DYSFUNCTION, ORGANIC 05/30/2009   History of thyroid nodule    Resolved by [place] 11/2006   HYPERLIPIDEMIA, MILD 05/30/2009   HYPERTHYROIDISM 12/08/2006   [place]   HYPERTROPHY PROSTATE W/UR OBST & OTH LUTS 02/26/2009   HYPOGONADISM 02/03/2010   Ischemic colitis ([place])    Tubular adenoma of colon 2004    PAST SURGICAL HISTORY: Past Surgical History:   Procedure Laterality Date   COLONOSCOPY     FLEXIBLE SIGMOIDOSCOPY N/A 03/08/2015   Procedure: FLEXIBLE SIGMOIDOSCOPY;  Surgeon: Ladene Artist, MD;  Location[facility] ENDOSCOPY;  Service: Endoscopy;  Laterality: N/A;    FAMILY HISTORY: Family History  Problem Relation Age of Onset   Colon cancer Sister 63   Stroke Other        GP   Diabetes Neg Hx    Thyroid disease Neg Hx        no goiter or other thyroid problem   Stomach cancer Neg Hx    Prostate cancer Neg Hx    Alzheimer's disease Neg Hx    Dementia Neg Hx     SOCIAL HISTORY: Social History   Socioeconomic History   Marital status: Married    Spouse name: [name]   Number of children: 2   Years of education: 19   Highest education level: Not on file  Occupational History   Occupation: Works part time, 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  Tobacco Use   Smoking status: Former   Smokeless tobacco: Never   Tobacco comments:    1-2 cigarettes per day  Substance and Sexual Activity   Alcohol use: Yes    Comment: very rare   Drug use: No   Sexual activity: Not on file    Comment: casual smoker  Other Topics Concern   Not on file  Social History Narrative   Lives with wife   Works part time, 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)   Drinks 2-3 cups caffeine daily       Social Determinants of Radio broadcast assistant Strain: Low Risk  (05/02/2021)   Overall Financial Resource Strain (CARDIA)    Difficulty of Paying Living Expenses: Not hard at all  Food Insecurity: No Food Insecurity (05/02/2021)   Hunger Vital Sign    Worried About Running Out of Food in the Last Year: Never true    [place] in the Last Year: Never true  Transportation Needs: No Transportation Needs (05/02/2021)   PRAPARE - Hydrologist (Medical): No    Lack of Transportation (Non-Medical): No  Physical Activity: Sufficiently Active (05/02/2021)   Exercise Vital Sign    Days  of Exercise per Week: 5 days    Minutes of Exercise per Session: 30 min  Stress: No Stress Concern Present (05/02/2021)   [place]    Feeling of Stress : Not at all  Social Connections: Socially Isolated (05/02/2021)   Social Connection and Isolation Panel [NHANES]    Frequency of Communication with Friends and Family: Once a week  Frequency of Social Gatherings with Friends and Family: Never    Attends Religious Services: Never    Marine scientist or Organizations: No    Attends Archivist Meetings: Never    Marital Status: Married  Human resources officer Violence: Not At Risk (05/02/2021)   Humiliation, Afraid, Rape, and Kick questionnaire    Fear of Current or Ex-Partner: No    Emotionally Abused: No    Physically Abused: No    Sexually Abused: No      PHYSICAL EXAM  Vitals:   09/24/21 1338  BP: 124/86  Pulse: 72  Weight: 152 lb 12.8 oz (69.3 kg)  Height: '5\' 10"'$  (1.778 m)   Body mass index is 21.92 kg/m.     09/24/2021    1:41 PM 05/30/2020    8:56 AM 12/01/2017    9:20 AM  MMSE - Mini Mental State Exam  Orientation to time '2 3 4  '$ Orientation to Place '5 5 5  '$ Registration '3 3 3  '$ Attention/ Calculation '1 3 4  '$ Recall 1 0 0  Language- name 2 objects '2 2 2  '$ Language- repeat 1 0 1  Language- follow 3 step command '3 3 3  '$ Language- read & follow direction '1 1 1  '$ Write a sentence '1 1 1  '$ Copy design 1 1 0  Total score '21 22 24     '$ Generalized: Well developed, in no acute distress   Neurological examination  Mentation: Alert oriented to time, place, history taking. Follows all commands speech and language fluent Cranial nerve II-XII: Pupils were equal round reactive to light. Extraocular movements were full, visual field were full on confrontational test. Facial sensation and strength were normal. Head turning and shoulder shrug  were normal and symmetric. Motor: The motor testing reveals 5 over  5 strength of all 4 extremities. Good symmetric motor tone is noted throughout.  Sensory: Sensory testing is intact to soft touch on all 4 extremities. No evidence of extinction is noted.  Coordination: Cerebellar testing reveals good finger-nose-finger and heel-to-shin bilaterally.  Gait and station: Gait is normal.  Reflexes: Deep tendon reflexes are symmetric and normal bilaterally.   DIAGNOSTIC DATA (LABS, IMAGING, TESTING) - I reviewed patient records, labs, notes, testing and imaging myself where available.  Lab Results  Component Value Date   WBC 3.7 (L) 05/28/2020   HGB 14.7 05/28/2020   HCT 43.3 05/28/2020   MCV 89.7 05/28/2020   PLT 179.0 05/28/2020      Component Value Date/Time   NA 139 05/28/2020 1630   K 4.9 05/28/2020 1630   CL 104 05/28/2020 1630   CO2 29 05/28/2020 1630   GLUCOSE 79 05/28/2020 1630   BUN 11 05/28/2020 1630   CREATININE 1.14 05/28/2020 1630   CALCIUM 9.9 05/28/2020 1630   PROT 7.2 05/28/2020 1630   ALBUMIN 4.2 05/28/2020 1630   AST 27 05/28/2020 1630   ALT 15 05/28/2020 1630   ALKPHOS 58 05/28/2020 1630   BILITOT 0.7 05/28/2020 1630   GFRNONAA >60 03/09/2015 0311   GFRAA >60 03/09/2015 0311   Lab Results  Component Value Date   CHOL 180 05/28/2020   HDL 65.50 05/28/2020   LDLCALC 88 05/28/2020   LDLDIRECT 163.0 03/12/2017   TRIG 135.0 05/28/2020   CHOLHDL 3 05/28/2020   No results found for: "HGBA1C" Lab Results  Component Value Date   VITAMINB12 1,364 (H) 05/28/2020   Lab Results  Component Value Date   TSH 2.10 05/28/2020  ASSESSMENT AND PLAN 88 y.o. year old male  has a past medical history of BACK PAIN (02/24/2008), Diplopia, Diverticulosis, ELECTROCARDIOGRAM, ABNORMAL (12/08/2006), ERECTILE DYSFUNCTION, ORGANIC (05/30/2009), History of thyroid nodule, HYPERLIPIDEMIA, MILD (05/30/2009), HYPERTHYROIDISM (12/08/2006), HYPERTROPHY PROSTATE W/UR OBST & OTH LUTS (02/26/2009), HYPOGONADISM (02/03/2010), Ischemic colitis ([place]), and  Tubular adenoma of colon (2004). here with:  1.  Memory disturbance  - MMSE stable  -Continue to monitor symptoms - Patient does not wish to try medication -Keep regular follow-ups with PCP, can follow-up with our office on an as-needed basis     Ward Givens, MSN, NP-C 09/24/2021, 1:59 PM [facility] [address] [place], [place] [address] [phone]

## 2021-09-24 NOTE — Patient Instructions (Signed)
Your Plan:  Continue to monitor symptoms Memory score is stable If your symptoms worsen or you develop new symptoms please let us know.       Thank you for coming to see [place] at [facility]. I hope we have been able to provide you high quality care today.  You may receive a patient satisfaction survey over the next few weeks. We would appreciate your feedback and comments so that we may continue to improve ourselves and the health of our patients.

## 2021-09-29 DIAGNOSIS — C61 Malignant neoplasm of prostate: Secondary | ICD-10-CM | POA: Diagnosis not present

## 2021-10-06 DIAGNOSIS — C61 Malignant neoplasm of prostate: Secondary | ICD-10-CM | POA: Diagnosis not present

## 2021-10-23 DIAGNOSIS — M1812 Unilateral primary osteoarthritis of first carpometacarpal joint, left hand: Secondary | ICD-10-CM | POA: Diagnosis not present

## 2021-10-23 DIAGNOSIS — M25532 Pain in left wrist: Secondary | ICD-10-CM | POA: Diagnosis not present

## 2021-10-23 DIAGNOSIS — M79642 Pain in left hand: Secondary | ICD-10-CM | POA: Diagnosis not present

## 2021-10-24 DIAGNOSIS — F0390 Unspecified dementia without behavioral disturbance: Secondary | ICD-10-CM | POA: Diagnosis not present

## 2021-10-24 DIAGNOSIS — N529 Male erectile dysfunction, unspecified: Secondary | ICD-10-CM | POA: Diagnosis not present

## 2021-10-24 DIAGNOSIS — Z823 Family history of stroke: Secondary | ICD-10-CM | POA: Diagnosis not present

## 2021-10-24 DIAGNOSIS — Z8546 Personal history of malignant neoplasm of prostate: Secondary | ICD-10-CM | POA: Diagnosis not present

## 2021-10-24 DIAGNOSIS — I951 Orthostatic hypotension: Secondary | ICD-10-CM | POA: Diagnosis not present

## 2021-10-24 DIAGNOSIS — Z5982 Transportation insecurity: Secondary | ICD-10-CM | POA: Diagnosis not present

## 2021-10-24 DIAGNOSIS — Z87891 Personal history of nicotine dependence: Secondary | ICD-10-CM | POA: Diagnosis not present

## 2021-10-24 DIAGNOSIS — Z8249 Family history of ischemic heart disease and other diseases of the circulatory system: Secondary | ICD-10-CM | POA: Diagnosis not present

## 2021-10-27 DIAGNOSIS — M19042 Primary osteoarthritis, left hand: Secondary | ICD-10-CM | POA: Diagnosis not present

## 2021-10-27 DIAGNOSIS — M79642 Pain in left hand: Secondary | ICD-10-CM | POA: Diagnosis not present

## 2021-12-18 DIAGNOSIS — H5000 Unspecified esotropia: Secondary | ICD-10-CM | POA: Diagnosis not present

## 2021-12-18 DIAGNOSIS — Z961 Presence of intraocular lens: Secondary | ICD-10-CM | POA: Diagnosis not present

## 2021-12-18 DIAGNOSIS — H04123 Dry eye syndrome of bilateral lacrimal glands: Secondary | ICD-10-CM | POA: Diagnosis not present

## 2021-12-18 DIAGNOSIS — H401131 Primary open-angle glaucoma, bilateral, mild stage: Secondary | ICD-10-CM | POA: Diagnosis not present

## 2022-01-26 ENCOUNTER — Telehealth: Payer: Self-pay | Admitting: Internal Medicine

## 2022-01-26 NOTE — Telephone Encounter (Signed)
Okay for visit within 24 hours with anyone

## 2022-01-26 NOTE — Telephone Encounter (Signed)
Wife, Tommy Browning, called to report pt has not eaten in several days and has no energy. Pt is not getting up and walking around. [name] wanted advice of what to do. She also said pt has not been diagnosed with flu or covid or pneumonia; no other symptoms or recent illness to report. Transferred wife to [place] [phone].

## 2022-01-26 NOTE — Telephone Encounter (Signed)
Please advise 

## 2022-01-26 NOTE — Progress Notes (Unsigned)
    Subjective:    Patient ID: Tommy Browning., male    DOB: [birth year], 88 y.o.   MRN: [id number]      HPI Jac is here for No chief complaint on file.   Fatigue, not eating -      Medications and allergies reviewed with patient and updated if appropriate.  Current Outpatient Medications on File Prior to Visit  Medication Sig Dispense Refill   aspirin 81 MG tablet Take 81 mg by mouth daily.     carboxymethylcellulose (REFRESH PLUS) 0.5 % SOLN Place 1 drop into both eyes 4 (four) times daily - after meals and at bedtime. (Patient not taking: Reported on 09/24/2021)     cyanocobalamin 1000 MCG tablet Take 1,000 mcg by mouth daily.     donepezil (ARICEPT) 5 MG tablet Take 1 tablet (5 mg total) by mouth at bedtime. (Patient not taking: Reported on 09/24/2021) 90 tablet 3   dorzolamide-timolol (COSOPT) 22.3-6.8 MG/ML ophthalmic solution Place 1 drop into both eyes every 12 (twelve) hours.     famotidine (PEPCID) 20 MG tablet Take 1 tablet (20 mg total) by mouth at bedtime. (Patient not taking: Reported on 09/24/2021) 90 tablet 1   fish oil-omega-3 fatty acids 1000 MG capsule Take 2 g by mouth daily.     Multiple Vitamin (MULTIVITAMIN) tablet Take 1 tablet by mouth daily.     Multiple Vitamins-Minerals (CENTRUM MEN PO) Take by mouth.     rosuvastatin (CRESTOR) 10 MG tablet Take 1 tablet (10 mg total) by mouth daily. (Patient not taking: Reported on 09/24/2021) 90 tablet 3   UNABLE TO FIND Med Name: Ageless Brain     No current facility-administered medications on file prior to visit.    Review of Systems     Objective:  There were no vitals filed for this visit. BP Readings from Last 3 Encounters:  09/24/21 124/86  01/31/21 118/80  05/28/20 122/80   Wt Readings from Last 3 Encounters:  09/24/21 152 lb 12.8 oz (69.3 kg)  01/31/21 150 lb 12.8 oz (68.4 kg)  05/30/20 142 lb 6.4 oz (64.6 kg)   There is no height or weight on file to calculate BMI.    Physical Exam          Assessment & Plan:    See Problem List for Assessment and Plan of chronic medical problems.

## 2022-01-26 NOTE — Telephone Encounter (Signed)
Pt have appt. Tomorrow w/ Dr. Quay Burow.

## 2022-01-27 ENCOUNTER — Ambulatory Visit: Payer: Medicare PPO | Admitting: Internal Medicine

## 2022-01-27 ENCOUNTER — Encounter: Payer: Self-pay | Admitting: Internal Medicine

## 2022-01-27 VITALS — BP 104/72 | HR 86 | Temp 98.0°F | Ht 70.0 in | Wt 145.0 lb

## 2022-01-27 DIAGNOSIS — R634 Abnormal weight loss: Secondary | ICD-10-CM

## 2022-01-27 DIAGNOSIS — R413 Other amnesia: Secondary | ICD-10-CM

## 2022-01-27 DIAGNOSIS — R630 Anorexia: Secondary | ICD-10-CM

## 2022-01-27 DIAGNOSIS — F3289 Other specified depressive episodes: Secondary | ICD-10-CM

## 2022-01-27 MED ORDER — MIRTAZAPINE 7.5 MG PO TABS: 7.5000 mg | ORAL_TABLET | Freq: Every day | ORAL | 2 refills | Status: AC

## 2022-01-27 NOTE — Patient Instructions (Addendum)
       Medications changes include :   mirtazapine 7.5 mg at night - try 1/2 of a pill the first couple of nights.      Return in about 3 weeks (around 02/17/2022) for follow up with PCP.

## 2022-01-31 ENCOUNTER — Other Ambulatory Visit: Payer: Self-pay

## 2022-01-31 ENCOUNTER — Inpatient Hospital Stay (HOSPITAL_COMMUNITY): Payer: Medicare PPO

## 2022-01-31 ENCOUNTER — Emergency Department (HOSPITAL_COMMUNITY): Payer: Medicare PPO

## 2022-01-31 ENCOUNTER — Inpatient Hospital Stay (HOSPITAL_COMMUNITY)
Admission: EM | Admit: 2022-01-31 | Discharge: 2022-02-27 | DRG: 871 | Disposition: E | Payer: Medicare PPO | Attending: Internal Medicine | Admitting: Internal Medicine

## 2022-01-31 DIAGNOSIS — Z87891 Personal history of nicotine dependence: Secondary | ICD-10-CM | POA: Diagnosis not present

## 2022-01-31 DIAGNOSIS — F03B0 Unspecified dementia, moderate, without behavioral disturbance, psychotic disturbance, mood disturbance, and anxiety: Secondary | ICD-10-CM | POA: Diagnosis not present

## 2022-01-31 DIAGNOSIS — N179 Acute kidney failure, unspecified: Secondary | ICD-10-CM | POA: Diagnosis not present

## 2022-01-31 DIAGNOSIS — J704 Drug-induced interstitial lung disorders, unspecified: Secondary | ICD-10-CM | POA: Diagnosis not present

## 2022-01-31 DIAGNOSIS — R000 Tachycardia, unspecified: Secondary | ICD-10-CM | POA: Diagnosis not present

## 2022-01-31 DIAGNOSIS — R350 Frequency of micturition: Secondary | ICD-10-CM | POA: Diagnosis present

## 2022-01-31 DIAGNOSIS — R0609 Other forms of dyspnea: Secondary | ICD-10-CM | POA: Diagnosis not present

## 2022-01-31 DIAGNOSIS — Z6821 Body mass index (BMI) 21.0-21.9, adult: Secondary | ICD-10-CM

## 2022-01-31 DIAGNOSIS — R54 Age-related physical debility: Secondary | ICD-10-CM | POA: Diagnosis not present

## 2022-01-31 DIAGNOSIS — Z7401 Bed confinement status: Secondary | ICD-10-CM

## 2022-01-31 DIAGNOSIS — G9341 Metabolic encephalopathy: Secondary | ICD-10-CM

## 2022-01-31 DIAGNOSIS — Z823 Family history of stroke: Secondary | ICD-10-CM | POA: Diagnosis not present

## 2022-01-31 DIAGNOSIS — T503X5A Adverse effect of electrolytic, caloric and water-balance agents, initial encounter: Secondary | ICD-10-CM | POA: Diagnosis not present

## 2022-01-31 DIAGNOSIS — Z66 Do not resuscitate: Secondary | ICD-10-CM | POA: Diagnosis not present

## 2022-01-31 DIAGNOSIS — N390 Urinary tract infection, site not specified: Secondary | ICD-10-CM | POA: Diagnosis not present

## 2022-01-31 DIAGNOSIS — K5730 Diverticulosis of large intestine without perforation or abscess without bleeding: Secondary | ICD-10-CM | POA: Diagnosis not present

## 2022-01-31 DIAGNOSIS — F0390 Unspecified dementia without behavioral disturbance: Secondary | ICD-10-CM | POA: Insufficient documentation

## 2022-01-31 DIAGNOSIS — E860 Dehydration: Secondary | ICD-10-CM | POA: Diagnosis present

## 2022-01-31 DIAGNOSIS — R110 Nausea: Secondary | ICD-10-CM | POA: Diagnosis not present

## 2022-01-31 DIAGNOSIS — E870 Hyperosmolality and hypernatremia: Secondary | ICD-10-CM | POA: Diagnosis present

## 2022-01-31 DIAGNOSIS — R410 Disorientation, unspecified: Secondary | ICD-10-CM | POA: Diagnosis not present

## 2022-01-31 DIAGNOSIS — J690 Pneumonitis due to inhalation of food and vomit: Secondary | ICD-10-CM | POA: Diagnosis not present

## 2022-01-31 DIAGNOSIS — Z86010 Personal history of colonic polyps: Secondary | ICD-10-CM

## 2022-01-31 DIAGNOSIS — M542 Cervicalgia: Secondary | ICD-10-CM | POA: Diagnosis not present

## 2022-01-31 DIAGNOSIS — Z800 Family history of malignant neoplasm of digestive organs: Secondary | ICD-10-CM | POA: Diagnosis not present

## 2022-01-31 DIAGNOSIS — A419 Sepsis, unspecified organism: Secondary | ICD-10-CM

## 2022-01-31 DIAGNOSIS — J9601 Acute respiratory failure with hypoxia: Secondary | ICD-10-CM | POA: Diagnosis not present

## 2022-01-31 DIAGNOSIS — N281 Cyst of kidney, acquired: Secondary | ICD-10-CM | POA: Diagnosis not present

## 2022-01-31 DIAGNOSIS — Z515 Encounter for palliative care: Secondary | ICD-10-CM

## 2022-01-31 DIAGNOSIS — D72829 Elevated white blood cell count, unspecified: Secondary | ICD-10-CM | POA: Diagnosis not present

## 2022-01-31 DIAGNOSIS — I4510 Unspecified right bundle-branch block: Secondary | ICD-10-CM | POA: Diagnosis present

## 2022-01-31 DIAGNOSIS — N401 Enlarged prostate with lower urinary tract symptoms: Secondary | ICD-10-CM | POA: Diagnosis not present

## 2022-01-31 DIAGNOSIS — E785 Hyperlipidemia, unspecified: Secondary | ICD-10-CM | POA: Diagnosis present

## 2022-01-31 DIAGNOSIS — R627 Adult failure to thrive: Secondary | ICD-10-CM | POA: Diagnosis not present

## 2022-01-31 DIAGNOSIS — R338 Other retention of urine: Secondary | ICD-10-CM

## 2022-01-31 DIAGNOSIS — R0689 Other abnormalities of breathing: Secondary | ICD-10-CM | POA: Diagnosis not present

## 2022-01-31 DIAGNOSIS — R531 Weakness: Secondary | ICD-10-CM | POA: Diagnosis not present

## 2022-01-31 DIAGNOSIS — Z7982 Long term (current) use of aspirin: Secondary | ICD-10-CM

## 2022-01-31 DIAGNOSIS — N400 Enlarged prostate without lower urinary tract symptoms: Secondary | ICD-10-CM | POA: Diagnosis present

## 2022-01-31 DIAGNOSIS — I5031 Acute diastolic (congestive) heart failure: Secondary | ICD-10-CM | POA: Diagnosis not present

## 2022-01-31 DIAGNOSIS — E039 Hypothyroidism, unspecified: Secondary | ICD-10-CM | POA: Diagnosis present

## 2022-01-31 DIAGNOSIS — R451 Restlessness and agitation: Secondary | ICD-10-CM | POA: Diagnosis not present

## 2022-01-31 DIAGNOSIS — R0602 Shortness of breath: Secondary | ICD-10-CM | POA: Diagnosis not present

## 2022-01-31 DIAGNOSIS — R4182 Altered mental status, unspecified: Secondary | ICD-10-CM | POA: Diagnosis not present

## 2022-01-31 DIAGNOSIS — R6520 Severe sepsis without septic shock: Secondary | ICD-10-CM | POA: Diagnosis present

## 2022-01-31 DIAGNOSIS — F03A0 Unspecified dementia, mild, without behavioral disturbance, psychotic disturbance, mood disturbance, and anxiety: Secondary | ICD-10-CM | POA: Diagnosis present

## 2022-01-31 DIAGNOSIS — I6381 Other cerebral infarction due to occlusion or stenosis of small artery: Secondary | ICD-10-CM | POA: Diagnosis not present

## 2022-01-31 DIAGNOSIS — Z8719 Personal history of other diseases of the digestive system: Secondary | ICD-10-CM

## 2022-01-31 DIAGNOSIS — R066 Hiccough: Secondary | ICD-10-CM | POA: Diagnosis not present

## 2022-01-31 DIAGNOSIS — Z79899 Other long term (current) drug therapy: Secondary | ICD-10-CM

## 2022-01-31 DIAGNOSIS — Z7189 Other specified counseling: Secondary | ICD-10-CM | POA: Diagnosis not present

## 2022-01-31 DIAGNOSIS — E46 Unspecified protein-calorie malnutrition: Secondary | ICD-10-CM | POA: Diagnosis not present

## 2022-01-31 DIAGNOSIS — N19 Unspecified kidney failure: Secondary | ICD-10-CM | POA: Diagnosis not present

## 2022-01-31 LAB — BASIC METABOLIC PANEL
Anion gap: 15 (ref 5–15)
BUN: 49 mg/dL — ABNORMAL HIGH (ref 8–23)
CO2: 26 mmol/L (ref 22–32)
Calcium: 9.7 mg/dL (ref 8.9–10.3)
Chloride: 106 mmol/L (ref 98–111)
Creatinine, Ser: 2.06 mg/dL — ABNORMAL HIGH (ref 0.61–1.24)
GFR, Estimated: 30 mL/min — ABNORMAL LOW (ref >60)
Glucose, Bld: 132 mg/dL — ABNORMAL HIGH (ref 70–99)
Potassium: 3.8 mmol/L (ref 3.5–5.1)
Sodium: 147 mmol/L — ABNORMAL HIGH (ref 135–145)

## 2022-01-31 LAB — URINALYSIS, MICROSCOPIC (REFLEX)

## 2022-01-31 LAB — URINALYSIS, ROUTINE W REFLEX MICROSCOPIC
Glucose, UA: 100 mg/dL — AB
Ketones, ur: NEGATIVE mg/dL
Nitrite: NEGATIVE
Protein, ur: 30 mg/dL — AB
Specific Gravity, Urine: 1.02 (ref 1.005–1.030)
pH: 5 (ref 5.0–8.0)

## 2022-01-31 LAB — CBC WITH DIFFERENTIAL/PLATELET
Abs Immature Granulocytes: 0.26 10*3/uL — ABNORMAL HIGH (ref 0.00–0.07)
Basophils Absolute: 0 10*3/uL (ref 0.0–0.1)
Basophils Relative: 0 %
Eosinophils Absolute: 0.1 10*3/uL (ref 0.0–0.5)
Eosinophils Relative: 1 %
HCT: 43.2 % (ref 39.0–52.0)
Hemoglobin: 14.7 g/dL (ref 13.0–17.0)
Immature Granulocytes: 1 %
Lymphocytes Relative: 5 %
Lymphs Abs: 0.9 10*3/uL (ref 0.7–4.0)
MCH: 30.5 pg (ref 26.0–34.0)
MCHC: 34 g/dL (ref 30.0–36.0)
MCV: 89.6 fL (ref 80.0–100.0)
Monocytes Absolute: 2 10*3/uL — ABNORMAL HIGH (ref 0.1–1.0)
Monocytes Relative: 11 %
Neutro Abs: 15.9 10*3/uL — ABNORMAL HIGH (ref 1.7–7.7)
Neutrophils Relative %: 82 %
Platelets: 461 10*3/uL — ABNORMAL HIGH (ref 150–400)
RBC: 4.82 MIL/uL (ref 4.22–5.81)
RDW: 13.8 % (ref 11.5–15.5)
WBC: 19.2 10*3/uL — ABNORMAL HIGH (ref 4.0–10.5)
nRBC: 0.1 % (ref 0.0–0.2)

## 2022-01-31 MED ORDER — LACTATED RINGERS IV BOLUS: 1000.0000 mL | Freq: Once | INTRAVENOUS | Status: DC

## 2022-01-31 MED ORDER — ASPIRIN 81 MG PO TBEC
81.0000 mg | DELAYED_RELEASE_TABLET | Freq: Every day | ORAL | Status: DC
  Administered 2022-02-01 – 2022-02-06 (×6): 81 mg via ORAL
  Filled 2022-01-31 (×6): qty 1

## 2022-01-31 MED ORDER — VANCOMYCIN HCL 1250 MG/250ML IV SOLN
1250.0000 mg | Freq: Once | INTRAVENOUS | Status: AC
  Administered 2022-02-01: 1250 mg via INTRAVENOUS
  Filled 2022-01-31: qty 250

## 2022-01-31 MED ORDER — ENOXAPARIN SODIUM 30 MG/0.3ML IJ SOSY
30.0000 mg | PREFILLED_SYRINGE | Freq: Every day | INTRAMUSCULAR | Status: DC
  Administered 2022-02-01 – 2022-02-02 (×2): 30 mg via SUBCUTANEOUS
  Filled 2022-01-31 (×2): qty 0.3

## 2022-01-31 MED ORDER — LACTATED RINGERS IV SOLN: INTRAVENOUS | Status: DC

## 2022-01-31 MED ORDER — ONDANSETRON HCL 4 MG/2ML IJ SOLN
4.0000 mg | Freq: Four times a day (QID) | INTRAMUSCULAR | Status: DC | PRN
  Administered 2022-02-04 – 2022-02-07 (×3): 4 mg via INTRAVENOUS
  Filled 2022-01-31 (×3): qty 2

## 2022-01-31 MED ORDER — HYDROCODONE-ACETAMINOPHEN 5-325 MG PO TABS
1.0000 | ORAL_TABLET | ORAL | Status: DC | PRN
  Administered 2022-02-01: 1 via ORAL
  Filled 2022-01-31: qty 1

## 2022-01-31 MED ORDER — SODIUM CHLORIDE 0.9 % IV SOLN
2.0000 g | INTRAVENOUS | Status: DC
  Administered 2022-02-01 – 2022-02-02 (×2): 2 g via INTRAVENOUS
  Filled 2022-01-31 (×2): qty 20

## 2022-01-31 MED ORDER — ONDANSETRON HCL 4 MG PO TABS: 4.0000 mg | ORAL_TABLET | Freq: Four times a day (QID) | ORAL | Status: DC | PRN

## 2022-01-31 MED ORDER — LATANOPROST 0.005 % OP SOLN
1.0000 [drp] | Freq: Every day | OPHTHALMIC | Status: DC
  Administered 2022-02-01 – 2022-02-11 (×10): 1 [drp] via OPHTHALMIC
  Filled 2022-01-31: qty 2.5

## 2022-01-31 MED ORDER — SODIUM CHLORIDE 0.9 % IV SOLN
2.0000 g | Freq: Once | INTRAVENOUS | Status: AC
  Administered 2022-01-31: 2 g via INTRAVENOUS
  Filled 2022-01-31: qty 12.5

## 2022-01-31 MED ORDER — LACTATED RINGERS IV BOLUS
2000.0000 mL | Freq: Once | INTRAVENOUS | Status: AC
  Administered 2022-01-31: 2000 mL via INTRAVENOUS

## 2022-01-31 MED ORDER — MIRTAZAPINE 15 MG PO TABS
7.5000 mg | ORAL_TABLET | Freq: Every day | ORAL | Status: DC
  Administered 2022-02-01 – 2022-02-02 (×3): 7.5 mg via ORAL
  Filled 2022-01-31 (×4): qty 1

## 2022-01-31 MED ORDER — ACETAMINOPHEN 650 MG RE SUPP
650.0000 mg | Freq: Four times a day (QID) | RECTAL | Status: DC | PRN
  Administered 2022-02-03 – 2022-02-07 (×2): 650 mg via RECTAL
  Filled 2022-01-31 (×2): qty 1

## 2022-01-31 MED ORDER — ACETAMINOPHEN 325 MG PO TABS
650.0000 mg | ORAL_TABLET | Freq: Four times a day (QID) | ORAL | Status: DC | PRN
  Administered 2022-02-05: 650 mg via ORAL
  Filled 2022-01-31 (×2): qty 2

## 2022-01-31 NOTE — Assessment & Plan Note (Addendum)
Secondary to sepsis.  Patient more confused than baseline Neurologic checks and fall and aspiration precautions Delirium precautions We will keep n.p.o. tonight due to lethargy IV hydration

## 2022-01-31 NOTE — H&P (Signed)
History and Physical    Patient: Tommy Browning. OEU:[id number] DOB: 10/24/1929 DOA: 02/23/2022 DOS: the patient was seen and examined on 02/07/2022 PCP: Hoyt Koch, MD  Patient coming from: Home  Chief Complaint:  Chief Complaint  Patient presents with   Altered Mental Status    HPI: Tommy Browning. is a 88 y.o. male with medical history significant for BPH, hypothyroidism and dementia was brought in by EMS for evaluation of a one-week history of weakness and lethargy with decreased oral intake, and frequent urination that was 'very dark to red".  At baseline patient is ambulant unable to participate in ADLs however he has been staying in the bed and gets agitated when encouraged to eat or do anything.  He has had no vomiting, fever or chills, abdominal pain or diarrhea.  History is provided by wife and daughter at bedside. ED course and data review: Afebrile with pulse 112, BP 104/78 respirations 20 with O2 sat 97% on room air.  Labs significant for WBC of 19,200, lactic acid pending.  Creatinine 2.06 up from baseline of 1.14 a year ago.  Sodium 147.  Urinalysis consistent with UTI.  EKG, personally viewed and interpreted shows sinus tachycardia at 110 with right bundle branch block.  Chest x-ray nonacute CT head nonacute but shows remote lacunar infarcts in the right basal ganglia.  Postvoid bladder scan in the ED was 422 mils so Foley catheter was placed. Patient started on sepsis fluids with 2 L LR as well as cefepime and vancomycin.  Hospitalist consulted for admission for sepsis secondary to UTI.     Past Medical History:  Diagnosis Date   BACK PAIN 02/24/2008   Diplopia    Diverticulosis    ELECTROCARDIOGRAM, ABNORMAL 12/08/2006   F/U by a normal stress test on September 2005   ERECTILE DYSFUNCTION, ORGANIC 05/30/2009   History of thyroid nodule    Resolved by [place] 11/2006   HYPERLIPIDEMIA, MILD 05/30/2009   HYPERTHYROIDISM 12/08/2006   [place]   HYPERTROPHY PROSTATE W/UR  OBST & OTH LUTS 02/26/2009   HYPOGONADISM 02/03/2010   Ischemic colitis ([place])    Tubular adenoma of colon 2004   Past Surgical History:  Procedure Laterality Date   COLONOSCOPY     FLEXIBLE SIGMOIDOSCOPY N/A 03/08/2015   Procedure: FLEXIBLE SIGMOIDOSCOPY;  Surgeon: Ladene Artist, MD;  Location: [facility] ENDOSCOPY;  Service: Endoscopy;  Laterality: N/A;   Social History:  reports that he has quit smoking. He has never used smokeless tobacco. He reports current alcohol use. He reports that he does not use drugs.  No Known Allergies  Family History  Problem Relation Age of Onset   Colon cancer Sister 55   Stroke Other        GP   Diabetes Neg Hx    Thyroid disease Neg Hx        no goiter or other thyroid problem   Stomach cancer Neg Hx    Prostate cancer Neg Hx    Alzheimer's disease Neg Hx    Dementia Neg Hx     Prior to Admission medications   Medication Sig Start Date End Date Taking? Authorizing Provider  aspirin 81 MG tablet Take 81 mg by mouth daily.   Yes [provider]  cholecalciferol (VITAMIN D3) 25 MCG (1000 UNIT) tablet Take 1,000 Units by mouth daily.   Yes [provider]  cyanocobalamin 1000 MCG tablet Take 1,000 mcg by mouth daily.   Yes [provider]  fish oil-omega-3 fatty acids 1000 MG capsule Take 1 g by mouth daily.   Yes [provider]  latanoprost (XALATAN) 0.005 % ophthalmic solution Place 1 drop into both eyes at bedtime. 12/18/21  Yes [provider]  mirtazapine (REMERON) 7.5 MG tablet Take 1 tablet (7.5 mg total) by mouth at bedtime. 01/27/22  Yes Burns, Claudina Lick, MD  Multiple Vitamins-Minerals (CENTRUM MEN PO) Take 1 tablet by mouth daily.   Yes [provider]    Physical Exam: Vitals:   02/17/2022 2103 02/22/2022 2115 02/23/2022 2117 02/23/2022 2130  BP:  120/78 118/85 104/78  Pulse:  (!) 53 (!) 113 (!) 112  Resp:  '20 17 20  '$ Temp:   98.2 F (36.8 C)   TempSrc:   Oral   SpO2:  100% 99% 97%  Weight:  68 kg     Height: '5\' 10"'$  (1.778 m)      Physical Exam Vitals and nursing note reviewed.  Constitutional:      General: He is sleeping. He is not in acute distress.    Appearance: He is underweight.  HENT:     Head: Normocephalic and atraumatic.  Cardiovascular:     Rate and Rhythm: Normal rate and regular rhythm.     Heart sounds: Normal heart sounds.  Pulmonary:     Effort: Pulmonary effort is normal.     Breath sounds: Normal breath sounds.  Abdominal:     Palpations: Abdomen is soft.     Tenderness: There is no abdominal tenderness.  Neurological:     General: No focal deficit present.     Mental Status: He is easily aroused. He is lethargic.     Labs on Admission: I have personally reviewed following labs and imaging studies  CBC: Recent Labs  Lab 02/23/2022 2123  WBC 19.2*  NEUTROABS 15.9*  HGB 14.7  HCT 43.2  MCV 89.6  PLT 573*   Basic Metabolic Panel: Recent Labs  Lab 02/04/2022 2123  NA 147*  K 3.8  CL 106  CO2 26  GLUCOSE 132*  BUN 49*  CREATININE 2.06*  CALCIUM 9.7   GFR: Estimated Creatinine Clearance: 22.9 mL/min (A) (by C-G formula based on SCr of 2.06 mg/dL (H)). Liver Function Tests: No results for input(s): "AST", "ALT", "ALKPHOS", "BILITOT", "PROT", "ALBUMIN" in the last 168 hours. No results for input(s): "LIPASE", "AMYLASE" in the last 168 hours. No results for input(s): "AMMONIA" in the last 168 hours. Coagulation Profile: No results for input(s): "INR", "PROTIME" in the last 168 hours. Cardiac Enzymes: No results for input(s): "CKTOTAL", "CKMB", "CKMBINDEX", "TROPONINI" in the last 168 hours. BNP (last 3 results) No results for input(s): "PROBNP" in the last 8760 hours. HbA1C: No results for input(s): "HGBA1C" in the last 72 hours. CBG: No results for input(s): "GLUCAP" in the last 168 hours. Lipid Profile: No results for input(s): "CHOL", "HDL", "LDLCALC", "TRIG", "CHOLHDL", "LDLDIRECT" in the last 72 hours. Thyroid Function  Tests: No results for input(s): "TSH", "T4TOTAL", "FREET4", "T3FREE", "THYROIDAB" in the last 72 hours. Anemia Panel: No results for input(s): "VITAMINB12", "FOLATE", "FERRITIN", "TIBC", "IRON", "RETICCTPCT" in the last 72 hours. Urine analysis:    Component Value Date/Time   COLORURINE AMBER (A) 01/30/2022 2203   APPEARANCEUR HAZY (A) 02/05/2022 2203   LABSPEC 1.020 02/18/2022 2203   PHURINE 5.0  2203   GLUCOSEU 100 (A) 02/22/2022 2203   HGBUR LARGE (A) 02/20/2022 2203   HGBUR negative 01/13/2010 1102   BILIRUBINUR MODERATE (A) 02/05/2022 2203  BILIRUBINUR neg 10/24/2010 1510   KETONESUR NEGATIVE 01/29/2022 2203   PROTEINUR 30 (A) 02/18/2022 2203   UROBILINOGEN 0.2 01/08/2015 1415   NITRITE NEGATIVE 02/16/2022 2203   LEUKOCYTESUR TRACE (A)  2203    Radiological Exams on Admission: CT HEAD WO CONTRAST (5MM)  Result Date: 01/28/2022 CLINICAL DATA:  Mental status change, unknown cause EXAM: CT HEAD WITHOUT CONTRAST TECHNIQUE: Contiguous axial images were obtained from the base of the skull through the vertex without intravenous contrast. RADIATION DOSE REDUCTION: This exam was performed according to the departmental dose-optimization program which includes automated exposure control, adjustment of the mA and/or kV according to patient size and/or use of iterative reconstruction technique. COMPARISON:  None Available. FINDINGS: Brain: Generalized atrophy is normal for age. There is moderate to advanced periventricular and deep white matter hypodensity typical of chronic small vessel ischemia. Remote lacunar infarcts in the right basal ganglia. No evidence of acute ischemia. No hemorrhage. No subdural or extra-axial collection. No hydrocephalus, midline shift or mass effect. Vascular: Atherosclerosis of skullbase vasculature without hyperdense vessel or abnormal calcification. Skull: No fracture or focal lesion. Sinuses/Orbits: No acute findings.  Bilateral intersection.  Other: None. IMPRESSION: 1. No acute intracranial abnormality. 2. Generalized atrophy and chronic small vessel ischemia. Remote lacunar infarcts in the right basal ganglia. Electronically Signed   By: Keith Rake M.D.   On: 02/09/2022 21:54   DG Chest Portable 1 View  Result Date: 02/15/2022 CLINICAL DATA:  Altered mental status EXAM: PORTABLE CHEST 1 VIEW COMPARISON:  Radiographs 04/05/2017 FINDINGS: No focal consolidation, pleural effusion, or pneumothorax. Normal cardiomediastinal silhouette. No acute osseous abnormality. IMPRESSION: No active disease. Electronically Signed   By: Placido Sou M.D.   On: 02/05/2022 21:49     Data Reviewed: Relevant notes from primary care and specialist visits, past discharge summaries as available in EHR, including Care Everywhere. Prior diagnostic testing as pertinent to current admission diagnoses Updated medications and problem lists for reconciliation ED course, including vitals, labs, imaging, treatment and response to treatment Triage notes, nursing and pharmacy notes and ED provider's notes Notable results as noted in HPI   Assessment and Plan: * Sepsis secondary to UTI ([place]) Sepsis criteria includes tachycardia, borderline low BP, WBC 19,000, AKI with acute encephalopathy, UA strongly consistent with UTI Postvoid bladder scan was 422 mils and Foley placed Rocephin and follow cultures    Acute urinary retention BPH with LUTS, suspected Continue Foley catheter with voiding trial prior to discharge We will get CT abdomen and pelvis to rule out other potential etiology for urinary retention beyond BPH    AKI (acute kidney injury) ([place]) Suspect a combination of pre and post renal Continue treatment of sepsis as above Continue Foley catheter CT abdomen ordered to evaluate for obstructive uropathy beyond prostatomegaly  Acute metabolic encephalopathy Secondary to sepsis.  Patient more confused than baseline Neurologic checks and  fall and aspiration precautions Delirium precautions We will keep n.p.o. tonight due to lethargy IV hydration  Hypothyroidism Not currently on levothyroxine  Dementia without behavioral disturbance (HCC) Hold mirtazapine due to lethargy Delirium precautions        DVT prophylaxis: Lovenox  Consults: none  Advance Care Planning:   Code Status: Prior   Family Communication: Wife and daughter at bedside  Disposition Plan: Back to previous home environment  Severity of Illness: The appropriate patient status for this patient is INPATIENT. Inpatient status is judged to be reasonable and necessary in order to provide the required intensity of service to ensure  the patient's safety. The patient's presenting symptoms, physical exam findings, and initial radiographic and laboratory data in the context of their chronic comorbidities is felt to place them at high risk for further clinical deterioration. Furthermore, it is not anticipated that the patient will be medically stable for discharge from the hospital within 2 midnights of admission.   * I certify that at the point of admission it is my clinical judgment that the patient will require inpatient hospital care spanning beyond 2 midnights from the point of admission due to high intensity of service, high risk for further deterioration and high frequency of surveillance required.*  Author: Athena Masse, MD 01/29/2022 11:10 PM  For on call review [email or web address].

## 2022-01-31 NOTE — Assessment & Plan Note (Signed)
Not currently on levothyroxine

## 2022-01-31 NOTE — ED Provider Notes (Signed)
[facility] EMERGENCY DEPARTMENT Provider Note   CSN: [id number] Arrival date & time: 02/08/2022  2058     History Chief Complaint  Patient presents with   Altered Mental Status    HPI Tommy Brandenberger. is a 89 y.o. male presenting for altered mental status and difficulty with urination.  He is a 88 year old male who lives at home with his family.  He unfortunately has underlying dementia and most of the collateral was collected by his daughter and wife were at bedside.  They endorse he has been going to the bathroom frequently and only getting a couple drops out at a time.  He started having worsening confusion with disorientation today and he has been refusing to eat or drink over the last 3 to 4 days.  He endorses subjective fevers though they have not measured his temperature.  The patient has no acute concerns at this time.  He denies fevers chills nausea vomiting syncope or shortness of breath..   Patient's recorded medical, surgical, social, medication list and allergies were reviewed in the Snapshot window as part of the initial history.   Review of Systems   Review of Systems  Constitutional:  Positive for chills. Negative for fever.  HENT:  Negative for ear pain and sore throat.   Eyes:  Negative for pain and visual disturbance.  Respiratory:  Negative for cough and shortness of breath.   Cardiovascular:  Negative for chest pain and palpitations.  Gastrointestinal:  Negative for abdominal pain and vomiting.  Genitourinary:  Positive for difficulty urinating. Negative for dysuria and hematuria.  Musculoskeletal:  Negative for arthralgias and back pain.  Skin:  Negative for color change and rash.  Neurological:  Negative for seizures and syncope.  Psychiatric/Behavioral:  Positive for confusion.   All other systems reviewed and are negative.   Physical Exam Updated Vital Signs BP 104/78   Pulse (!) 112   Temp 98.2 F (36.8 C) (Oral)   Resp 20   Ht 5'  10" (1.778 m)   Wt 68 kg   SpO2 97%   BMI 21.52 kg/m  Physical Exam Vitals and nursing note reviewed.  Constitutional:      General: He is not in acute distress.    Appearance: He is well-developed.  HENT:     Head: Normocephalic and atraumatic.  Eyes:     Conjunctiva/sclera: Conjunctivae normal.  Cardiovascular:     Rate and Rhythm: Normal rate and regular rhythm.     Heart sounds: No murmur heard. Pulmonary:     Effort: Pulmonary effort is normal. No respiratory distress.     Breath sounds: Normal breath sounds.  Abdominal:     Palpations: Abdomen is soft.     Tenderness: There is no abdominal tenderness.  Musculoskeletal:        General: No swelling.     Cervical back: Neck supple.  Skin:    General: Skin is warm and dry.     Capillary Refill: Capillary refill takes less than 2 seconds.  Neurological:     General: No focal deficit present.     Mental Status: He is alert. He is disoriented.     Cranial Nerves: No cranial nerve deficit.     Sensory: No sensory deficit.  Psychiatric:        Mood and Affect: Mood normal.      ED Course/ Medical Decision Making/ A&P    Procedures .Critical Care  Performed by: Tretha Sciara, MD Authorized by:  Tretha Sciara, MD   Critical care provider statement:    Critical care time (minutes):  30   Critical care was necessary to treat or prevent imminent or life-threatening deterioration of the following conditions:  Sepsis (Needing fluid boluses, with endorgan damage.)   Critical care was time spent personally by me on the following activities:  Development of treatment plan with patient or surrogate, discussions with consultants, evaluation of patient's response to treatment, examination of patient, ordering and review of laboratory studies, ordering and review of radiographic studies, ordering and performing treatments and interventions, pulse oximetry, re-evaluation of patient's condition and review of old charts     Medications Ordered in ED Medications  vancomycin (VANCOREADY) IVPB 1250 mg/250 mL (has no administration in time range)  mirtazapine (REMERON) tablet 7.5 mg (has no administration in time range)  latanoprost (XALATAN) 0.005 % ophthalmic solution 1 drop (has no administration in time range)  aspirin EC tablet 81 mg (has no administration in time range)  enoxaparin (LOVENOX) injection 30 mg (has no administration in time range)  lactated ringers infusion (has no administration in time range)  cefTRIAXone (ROCEPHIN) 2 g in sodium chloride 0.9 % 100 mL IVPB (has no administration in time range)  acetaminophen (TYLENOL) tablet 650 mg (has no administration in time range)    Or  acetaminophen (TYLENOL) suppository 650 mg (has no administration in time range)  HYDROcodone-acetaminophen (NORCO/VICODIN) 5-325 MG per tablet 1-2 tablet (has no administration in time range)  ondansetron (ZOFRAN) tablet 4 mg (has no administration in time range)    Or  ondansetron (ZOFRAN) injection 4 mg (has no administration in time range)  ceFEPIme (MAXIPIME) 2 g in sodium chloride 0.9 % 100 mL IVPB (2 g Intravenous New Bag/Given 02/23/2022 2318)  lactated ringers bolus 2,000 mL (2,000 mLs Intravenous New Bag/Given 02/08/2022 2319)    Medical Decision Making:    Tommy Freund. is a 88 y.o. male who presented to the ED today with altered mental status detailed above.     Patient's presentation is complicated by their history of multiple comorbid medical conditions including advanced age and underlying dementia.  Patient placed on continuous vitals and telemetry monitoring while in ED which was reviewed periodically.   Complete initial physical exam performed, notably the patient  was GCS 14 with disorientation.  He was initially hemodynamically stable though vitals trended towards tachycardia.      Reviewed and confirmed nursing documentation for past medical history, family history, social history.    Initial  Assessment:   With the patient's presentation of altered mental status, most likely diagnosis is metabolic versus infectious etiology causing delirium. Other diagnoses were considered including (but not limited to) intracranial hemorrhage, CVA, meningitis, encephalitis. These are considered less likely due to history of present illness and physical exam findings.   This is most consistent with an acute life/limb threatening illness complicated by underlying chronic conditions.  Initial Plan:  Screening labs including CBC and Metabolic panel to evaluate for infectious or metabolic etiology of disease.  Urinalysis with reflex culture ordered to evaluate for UTI or relevant urologic/nephrologic pathology.  CXR to evaluate for structural/infectious intrathoracic pathology.  EKG to evaluate for cardiac pathology. Objective evaluation as below reviewed with plan for close reassessment  Initial Study Results:   Laboratory  Multiple abnormalities appreciated including leukocytosis and acute kidney injury with decrease in his GFR to 30  EKG EKG was reviewed independently. Rate, rhythm, axis, intervals all examined and without medically relevant abnormality. ST  segments without concerns for elevations.    Radiology  All images reviewed independently. Agree with radiology report at this time.   CT HEAD WO CONTRAST (5MM)  Result Date:  CLINICAL DATA:  Mental status change, unknown cause EXAM: CT HEAD WITHOUT CONTRAST TECHNIQUE: Contiguous axial images were obtained from the base of the skull through the vertex without intravenous contrast. RADIATION DOSE REDUCTION: This exam was performed according to the departmental dose-optimization program which includes automated exposure control, adjustment of the mA and/or kV according to patient size and/or use of iterative reconstruction technique. COMPARISON:  None Available. FINDINGS: Brain: Generalized atrophy is normal for age. There is moderate to  advanced periventricular and deep white matter hypodensity typical of chronic small vessel ischemia. Remote lacunar infarcts in the right basal ganglia. No evidence of acute ischemia. No hemorrhage. No subdural or extra-axial collection. No hydrocephalus, midline shift or mass effect. Vascular: Atherosclerosis of skullbase vasculature without hyperdense vessel or abnormal calcification. Skull: No fracture or focal lesion. Sinuses/Orbits: No acute findings.  Bilateral intersection. Other: None. IMPRESSION: 1. No acute intracranial abnormality. 2. Generalized atrophy and chronic small vessel ischemia. Remote lacunar infarcts in the right basal ganglia. Electronically Signed   By: Keith Rake M.D.   On: 02/23/2022 21:54   DG Chest Portable 1 View  Result Date: 02/02/2022 CLINICAL DATA:  Altered mental status EXAM: PORTABLE CHEST 1 VIEW COMPARISON:  Radiographs 04/05/2017 FINDINGS: No focal consolidation, pleural effusion, or pneumothorax. Normal cardiomediastinal silhouette. No acute osseous abnormality. IMPRESSION: No active disease. Electronically Signed   By: Placido Sou M.D.   On: 01/28/2022 21:49     Final Assessment and Plan:   Once patient's white blood cell count resulted, there is concern for acute sepsis.  Sepsis protocol was initiated with concern for possible developing renal disease.  After further conversations with the family, this is likely more secondary to obstructive uropathy from BPH and urinary retention.  Foley catheter placed, broad-spectrum antibiotics and sepsis protocol initiated as well.  Patient arranged for admission with hospitalist for ongoing care and management.   Disposition:   Based on the above findings, I believe this patient is stable for admission.    Patient/family educated about specific findings on our evaluation and explained exact reasons for admission.  Patient/family educated about clinical situation and time was allowed to answer questions.    Admission team communicated with and agreed with need for admission. Patient admitted. Patient  ready to move at this time.     Emergency Department Medication Summary:   Medications  vancomycin (VANCOREADY) IVPB 1250 mg/250 mL (has no administration in time range)  mirtazapine (REMERON) tablet 7.5 mg (has no administration in time range)  latanoprost (XALATAN) 0.005 % ophthalmic solution 1 drop (has no administration in time range)  aspirin EC tablet 81 mg (has no administration in time range)  enoxaparin (LOVENOX) injection 30 mg (has no administration in time range)  lactated ringers infusion (has no administration in time range)  cefTRIAXone (ROCEPHIN) 2 g in sodium chloride 0.9 % 100 mL IVPB (has no administration in time range)  acetaminophen (TYLENOL) tablet 650 mg (has no administration in time range)    Or  acetaminophen (TYLENOL) suppository 650 mg (has no administration in time range)  HYDROcodone-acetaminophen (NORCO/VICODIN) 5-325 MG per tablet 1-2 tablet (has no administration in time range)  ondansetron (ZOFRAN) tablet 4 mg (has no administration in time range)    Or  ondansetron (ZOFRAN) injection 4 mg (has no administration in time range)  ceFEPIme (MAXIPIME) 2 g in sodium chloride 0.9 % 100 mL IVPB (2 g Intravenous New Bag/Given 02/05/2022 2318)  lactated ringers bolus 2,000 mL (2,000 mLs Intravenous New Bag/Given 01/29/2022 2319)         Clinical Impression:  1. Sepsis, due to unspecified organism, unspecified whether acute organ dysfunction present [facility])      Admit   Final Clinical Impression(s) / ED Diagnoses Final diagnoses:  Sepsis, due to unspecified organism, unspecified whether acute organ dysfunction present [facility])    Rx / DC Orders ED Discharge Orders     None         Tretha Sciara, MD 02/26/2022 2355

## 2022-01-31 NOTE — Assessment & Plan Note (Addendum)
Hold mirtazapine due to lethargy Delirium precautions

## 2022-01-31 NOTE — ED Notes (Signed)
Patient transported to CT 

## 2022-01-31 NOTE — ED Triage Notes (Addendum)
BIB EMS coming from home for increased weakness, loss of appetite, and 3xdays urine darkened. HX dementia, baseline "sharp as a tack", today Aox2.   Patient unable to remember the last time he urinated.

## 2022-01-31 NOTE — ED Notes (Signed)
Bladder scan performed at bedside and resulted in 436m

## 2022-01-31 NOTE — Assessment & Plan Note (Signed)
Suspect a combination of pre and post renal Continue treatment of sepsis as above Continue Foley catheter CT abdomen ordered to evaluate for obstructive uropathy beyond prostatomegaly

## 2022-01-31 NOTE — Assessment & Plan Note (Addendum)
BPH with LUTS, suspected Continue Foley catheter with voiding trial prior to discharge We will get CT abdomen and pelvis to rule out other potential etiology for urinary retention beyond BPH

## 2022-01-31 NOTE — Assessment & Plan Note (Addendum)
Sepsis criteria includes tachycardia, borderline low BP, WBC 19,000, AKI with acute encephalopathy, UA strongly consistent with UTI Postvoid bladder scan was 422 mils and Foley placed Rocephin and follow cultures

## 2022-02-01 ENCOUNTER — Inpatient Hospital Stay (HOSPITAL_COMMUNITY): Payer: Medicare PPO

## 2022-02-01 ENCOUNTER — Other Ambulatory Visit (HOSPITAL_COMMUNITY): Payer: Medicare PPO

## 2022-02-01 ENCOUNTER — Encounter (HOSPITAL_COMMUNITY): Payer: Self-pay | Admitting: Internal Medicine

## 2022-02-01 DIAGNOSIS — A419 Sepsis, unspecified organism: Secondary | ICD-10-CM | POA: Diagnosis not present

## 2022-02-01 DIAGNOSIS — N390 Urinary tract infection, site not specified: Secondary | ICD-10-CM | POA: Diagnosis not present

## 2022-02-01 LAB — BASIC METABOLIC PANEL
Anion gap: 11 (ref 5–15)
BUN: 49 mg/dL — ABNORMAL HIGH (ref 8–23)
CO2: 27 mmol/L (ref 22–32)
Calcium: 9.3 mg/dL (ref 8.9–10.3)
Chloride: 109 mmol/L (ref 98–111)
Creatinine, Ser: 1.83 mg/dL — ABNORMAL HIGH (ref 0.61–1.24)
GFR, Estimated: 35 mL/min — ABNORMAL LOW (ref >60)
Glucose, Bld: 116 mg/dL — ABNORMAL HIGH (ref 70–99)
Potassium: 4.1 mmol/L (ref 3.5–5.1)
Sodium: 147 mmol/L — ABNORMAL HIGH (ref 135–145)

## 2022-02-01 LAB — CBC
HCT: 41.6 % (ref 39.0–52.0)
Hemoglobin: 13.2 g/dL (ref 13.0–17.0)
MCH: 29.3 pg (ref 26.0–34.0)
MCHC: 31.7 g/dL (ref 30.0–36.0)
MCV: 92.4 fL (ref 80.0–100.0)
Platelets: 489 10*3/uL — ABNORMAL HIGH (ref 150–400)
RBC: 4.5 MIL/uL (ref 4.22–5.81)
RDW: 13.9 % (ref 11.5–15.5)
WBC: 20.6 10*3/uL — ABNORMAL HIGH (ref 4.0–10.5)
nRBC: 0.1 % (ref 0.0–0.2)

## 2022-02-01 LAB — TSH: TSH: 1.216 u[IU]/mL (ref 0.350–4.500)

## 2022-02-01 LAB — CORTISOL-AM, BLOOD: Cortisol - AM: 40.8 ug/dL — ABNORMAL HIGH (ref 6.7–22.6)

## 2022-02-01 MED ORDER — FUROSEMIDE 10 MG/ML IJ SOLN: 40.0000 mg | Freq: Once | INTRAMUSCULAR | Status: DC

## 2022-02-01 MED ORDER — DEXTROSE 5 % IV SOLN: INTRAVENOUS | Status: DC

## 2022-02-01 MED ORDER — OMEGA-3-ACID ETHYL ESTERS 1 G PO CAPS
1.0000 g | ORAL_CAPSULE | Freq: Every day | ORAL | Status: DC
  Administered 2022-02-01 – 2022-02-03 (×3): 1 g via ORAL
  Filled 2022-02-01 (×6): qty 1

## 2022-02-01 MED ORDER — TAMSULOSIN HCL 0.4 MG PO CAPS
0.4000 mg | ORAL_CAPSULE | Freq: Every day | ORAL | Status: DC
  Administered 2022-02-01 – 2022-02-06 (×6): 0.4 mg via ORAL
  Filled 2022-02-01 (×6): qty 1

## 2022-02-01 MED ORDER — VITAMIN B-12 1000 MCG PO TABS
1000.0000 ug | ORAL_TABLET | Freq: Every day | ORAL | Status: DC
  Administered 2022-02-01 – 2022-02-06 (×6): 1000 ug via ORAL
  Filled 2022-02-01 (×6): qty 1

## 2022-02-01 MED ORDER — FUROSEMIDE 10 MG/ML IJ SOLN
20.0000 mg | Freq: Once | INTRAMUSCULAR | Status: AC
  Administered 2022-02-01: 20 mg via INTRAVENOUS
  Filled 2022-02-01: qty 2

## 2022-02-01 NOTE — Progress Notes (Signed)
Patient family called me into room at approximately 1840 due to patient having some intermittent breathing episodes.  Contacted Dr. Candiss Norse and he advised to stop IV fluids, adjust head of bed to 40-45 degrees and put patient on 3L oxygen [place].  Dr. Candiss Norse advised he would also order a chest xray.  Went over this information with oncoming night nurse, Johnathan.  Dr. Candiss Norse called back and advised he ordered '20mg'$  lasix.  Johnathan advised he would give the lasix to patient.  Dr. Candiss Norse also advised if patient did not get any better or worsening to contact the night MD.

## 2022-02-01 NOTE — ED Notes (Signed)
Patient pulled all cardiac leads off self, and gown off. Patient repositioned back onto bed, instructed patient to keep cardiac wires on. Pt verbalized understanding and turned onto his side.

## 2022-02-01 NOTE — ED Notes (Signed)
Patient removed himself from monitor again. Per family "He keeps pulling it off"

## 2022-02-01 NOTE — Progress Notes (Signed)
PROGRESS NOTE                                                                                                                                                                                                             Patient Demographics:    Tommy Browning, is a 88 y.o. male, DOB - April 10, 1929, EHM:[id number]  Outpatient Primary MD for the patient is Hoyt Koch, MD    LOS - 1  Admit date - 02/14/2022    Chief Complaint  Patient presents with   Altered Mental Status       Brief Narrative (HPI from H&P)    88 y.o. male with medical history significant for BPH, hypothyroidism and dementia was brought in by EMS for evaluation of a one-week history of weakness and lethargy with decreased oral intake, and frequent urination that was 'very dark to red".  At baseline patient is ambulant unable to participate in ADLs however he has been staying in the bed and gets agitated when encouraged to eat or do anything.  The ER he was diagnosed with urinary retention with sepsis, UTI and toxic encephalopathy and admitted.   Subjective:    Mendy Lapinsky today has, No headache, No chest pain, No abdominal pain - No Nausea, No new weakness tingling or numbness, no SOB.   Assessment  & Plan :   Sepsis secondary to UTI caused by urinary retention.  Underlying history of BPH. Foley placed in the ER, on empiric antibiotics, sepsis pathophysiology is improving, add Flomax with outpatient urology follow-up.  Will be discharged with Foley catheter.  Dehydration, hypernatremia and AKI.  Hydrate with IV fluids and monitor.  CT abdomen pelvis nonacute.   Acute metabolic encephalopathy - Secondary to sepsis.  He is pretty deconditioned and largely bedbound at baseline as well, treat sepsis, PT OT and advance activity.  Hypothyroidism - Not currently on levothyroxine, check TSH.  Dementia without behavioral disturbance - Hold mirtazapine due to  lethargy, delirium precautions, minimize narcotics and benzodiazepines.       Condition -  Guarded  Family Communication  :  None  Code Status :  Full  Consults  :  None  PUD Prophylaxis :    Procedures  :     CT head.  Non acute  CT abdomen pelvis -  Unremarkable CT abdomen/pelvis.  Disposition Plan  :    Status is: Inpatient  DVT Prophylaxis  :    enoxaparin (LOVENOX) injection 30 mg Start: 02/01/22 1000    Lab Results  Component Value Date   PLT 489 (H) 02/01/2022    Diet :  Diet Order             Diet regular Room service appropriate? Yes; Fluid consistency: Thin  Diet effective now                    Inpatient Medications  Scheduled Meds:  aspirin EC  81 mg Oral Daily   cyanocobalamin  1,000 mcg Oral Daily   enoxaparin (LOVENOX) injection  30 mg Subcutaneous Daily   latanoprost  1 drop Both Eyes QHS   mirtazapine  7.5 mg Oral QHS   omega-3 acid ethyl esters  1 g Oral Daily   Continuous Infusions:  cefTRIAXone (ROCEPHIN)  IV     dextrose 110 mL/hr at 02/01/22 0622   PRN Meds:.acetaminophen **OR** acetaminophen, HYDROcodone-acetaminophen, ondansetron **OR** ondansetron (ZOFRAN) IV  Antibiotics  :    Anti-infectives (From admission, onward)    Start     Dose/Rate Route Frequency Ordered Stop   02/01/22 2200  cefTRIAXone (ROCEPHIN) 2 g in sodium chloride 0.9 % 100 mL IVPB        2 g 200 mL/hr over 30 Minutes Intravenous Every 24 hours  2322 02/08/22 2159   01/29/2022 2330  vancomycin (VANCOREADY) IVPB 1250 mg/250 mL        1,250 mg 166.7 mL/hr over 90 Minutes Intravenous  Once 02/22/2022 2229 02/01/22 0518   02/16/2022 2230  ceFEPIme (MAXIPIME) 2 g in sodium chloride 0.9 % 100 mL IVPB        2 g 200 mL/hr over 30 Minutes Intravenous  Once 02/21/2022 2229 01/28/2022 2348         Objective:   Vitals:   02/01/22 0630 02/01/22 0700 02/01/22 0845 02/01/22 0902  BP: '97/67 94/68 92/66 '$ 92/70  Pulse: 89 86 79 78  Resp:    20  Temp:     (!) 97.4 F (36.3 C)  TempSrc:    Oral  SpO2: 93% 92% 97% 97%  Weight:      Height:        Wt Readings from Last 3 Encounters:  02/19/2022 68 kg  01/27/22 65.8 kg  09/24/21 69.3 kg     Intake/Output Summary (Last 24 hours) at 02/01/2022 0916 Last data filed at 02/01/2022 6761 Gross per 24 hour  Intake --  Output 850 ml  Net -850 ml     Physical Exam  Awake Alert, No new F.N deficits, Foley [place].AT,PERRAL Supple Neck, No JVD,   Symmetrical Chest wall movement, Good air movement bilaterally, CTAB RRR,No Gallops,Rubs or new Murmurs,  +ve B.Sounds, Abd Soft, No tenderness,   No Cyanosis, Clubbing or edema       Data Review:    CBC Recent Labs  Lab 02/06/2022 2123 02/01/22 0230  WBC 19.2* 20.6*  HGB 14.7 13.2  HCT 43.2 41.6  PLT 461* 489*  MCV 89.6 92.4  MCH 30.5 29.3  MCHC 34.0 31.7  RDW 13.8 13.9  LYMPHSABS 0.9  --   MONOABS 2.0*  --   EOSABS 0.1  --   BASOSABS 0.0  --     Electrolytes Recent Labs  Lab 02/13/2022 2123 02/01/22 0230  NA 147* 147*  K 3.8 4.1  CL 106 109  CO2 26 27  GLUCOSE  132* 116*  BUN 49* 49*  CREATININE 2.06* 1.83*  CALCIUM 9.7 9.3      Radiology Reports CT ABDOMEN PELVIS WO CONTRAST  Result Date: 02/01/2022 CLINICAL DATA:  Kidney failure weakness, decreased appetite, dementia EXAM: CT ABDOMEN AND PELVIS WITHOUT CONTRAST TECHNIQUE: Multidetector CT imaging of the abdomen and pelvis was performed following the standard protocol without IV contrast. RADIATION DOSE REDUCTION: This exam was performed according to the departmental dose-optimization program which includes automated exposure control, adjustment of the mA and/or kV according to patient size and/or use of iterative reconstruction technique. COMPARISON:  CT chest abdomen pelvis dated 04/04/2021. FINDINGS: Lower chest: Mild right basilar atelectasis. Hepatobiliary: Unenhanced liver is unremarkable. Gallbladder is unremarkable. No intrahepatic or extrahepatic duct dilatation.  Pancreas: Within normal limits. Spleen: Within normal limits. Adrenals/Urinary Tract: Adrenal glands are within normal limits. 3.7 cm left lower pole renal cyst (series 3/image 39), measuring simple fluid density, benign (Bosniak I). No follow-up is recommended. Kidneys are otherwise within normal limits.  No hydronephrosis. Bladder is decompressed by an indwelling Foley catheter. Stomach/Bowel: Stomach is within normal limits. No evidence of bowel obstruction. Normal appendix (series 3/image 62). Scattered mild colonic diverticulosis, without evidence of diverticulitis. Vascular/Lymphatic: No evidence of abdominal aortic aneurysm. Atherosclerotic calcifications of the abdominal aorta and branch vessels. No suspicious abdominopelvic lymphadenopathy. Reproductive: Prostate is grossly unremarkable. Other: No abdominopelvic ascites. Musculoskeletal: Mild degenerative changes of the lower thoracic spine. IMPRESSION: Unremarkable CT abdomen/pelvis. Electronically Signed   By: Julian Hy M.D.   On: 02/01/2022 01:44   CT HEAD WO CONTRAST (5MM)  Result Date: 02/13/2022 CLINICAL DATA:  Mental status change, unknown cause EXAM: CT HEAD WITHOUT CONTRAST TECHNIQUE: Contiguous axial images were obtained from the base of the skull through the vertex without intravenous contrast. RADIATION DOSE REDUCTION: This exam was performed according to the departmental dose-optimization program which includes automated exposure control, adjustment of the mA and/or kV according to patient size and/or use of iterative reconstruction technique. COMPARISON:  None Available. FINDINGS: Brain: Generalized atrophy is normal for age. There is moderate to advanced periventricular and deep white matter hypodensity typical of chronic small vessel ischemia. Remote lacunar infarcts in the right basal ganglia. No evidence of acute ischemia. No hemorrhage. No subdural or extra-axial collection. No hydrocephalus, midline shift or mass effect.  Vascular: Atherosclerosis of skullbase vasculature without hyperdense vessel or abnormal calcification. Skull: No fracture or focal lesion. Sinuses/Orbits: No acute findings.  Bilateral intersection. Other: None. IMPRESSION: 1. No acute intracranial abnormality. 2. Generalized atrophy and chronic small vessel ischemia. Remote lacunar infarcts in the right basal ganglia. Electronically Signed   By: Keith Rake M.D.   On: 02/15/2022 21:54   DG Chest Portable 1 View  Result Date: 02/16/2022 CLINICAL DATA:  Altered mental status EXAM: PORTABLE CHEST 1 VIEW COMPARISON:  Radiographs 04/05/2017 FINDINGS: No focal consolidation, pleural effusion, or pneumothorax. Normal cardiomediastinal silhouette. No acute osseous abnormality. IMPRESSION: No active disease. Electronically Signed   By: Placido Sou M.D.   On: 02/17/2022 21:49      Signature  Lala Lund M.D on 02/01/2022 at 9:16 AM   -  To page go to [email or web address]

## 2022-02-01 NOTE — Evaluation (Signed)
Clinical/Bedside Swallow Evaluation Patient Details  Name: Tommy Browning. MRN: [id number] Date of Birth: Jan 01, 1930  Today's Date: 02/01/2022 Time: SLP Start Time (ACUTE ONLY): 1440 SLP Stop Time (ACUTE ONLY): 1500 SLP Time Calculation (min) (ACUTE ONLY): 20 min  Past Medical History:  Past Medical History:  Diagnosis Date   BACK PAIN 02/24/2008   Diplopia    Diverticulosis    ELECTROCARDIOGRAM, ABNORMAL 12/08/2006   F/U by a normal stress test on September 2005   ERECTILE DYSFUNCTION, ORGANIC 05/30/2009   History of thyroid nodule    Resolved by [place] 11/2006   HYPERLIPIDEMIA, MILD 05/30/2009   HYPERTHYROIDISM 12/08/2006   [place]   HYPERTROPHY PROSTATE W/UR OBST & OTH LUTS 02/26/2009   HYPOGONADISM 02/03/2010   Ischemic colitis ([place])    Tubular adenoma of colon 2004   Past Surgical History:  Past Surgical History:  Procedure Laterality Date   COLONOSCOPY     FLEXIBLE SIGMOIDOSCOPY N/A 03/08/2015   Procedure: FLEXIBLE SIGMOIDOSCOPY;  Surgeon: Ladene Artist, MD;  Location[facility] ENDOSCOPY;  Service: Endoscopy;  Laterality: N/A;   HPI:  88 y.o. male presented from home to ED with sepsis secondary to UTI, AKI, acute metabolic encephalopathy. PMHx of dementia, hypothyroidism, BPH.    Assessment / Plan / Recommendation  Clinical Impression  Pt was easily rousable for clinical swallowing assessment. Oral mechanism exam was normal.  He demonstrated thorough mastication of solids, the appearance of a functional swallow with no s/s of aspiration. His wife was at bedside and described no hx of dysphagia, only poor PO intake the last several days. Recommend continuing to offer regular solids, thin liquids. Give meds whole in water - if he coughs, give whole in applesauce.  Will need assist with self-feeding. No further SLP f/u is needed. Our service will sign off. SLP Visit Diagnosis: Dysphagia, unspecified (R13.10)    Aspiration Risk  No limitations    Diet Recommendation   Regular solids,  thin liquids  Medication Administration: Whole meds with liquid    Other  Recommendations Oral Care Recommendations: Oral care BID    Recommendations for follow up therapy are one component of a multi-disciplinary discharge planning process, led by the attending physician.  Recommendations may be updated based on patient status, additional functional criteria and insurance authorization.  Follow up Recommendations No SLP follow up      Assistance Recommended at Discharge Frequent or constant Supervision/Assistance    Swallow Study   General HPI: 88 y.o. male presented from home to ED with sepsis secondary to UTI, AKI, acute metabolic encephalopathy. PMHx of dementia, hypothyroidism, BPH. Type of Study: Bedside Swallow Evaluation Previous Swallow Assessment: no Diet Prior to this Study: Regular;Thin liquids Temperature Spikes Noted: No Respiratory Status: Room air History of Recent Intubation: No Behavior/Cognition: Alert;Cooperative;Confused Oral Cavity Assessment: Within Functional Limits Oral Care Completed by SLP: No Oral Cavity - Dentition: Adequate natural dentition Vision: Functional for self-feeding Self-Feeding Abilities: Needs assist Patient Positioning: Upright in bed Baseline Vocal Quality: Normal Volitional Cough: Strong Volitional Swallow: Able to elicit    Oral/Motor/Sensory Function Overall Oral Motor/Sensory Function: Within functional limits   Ice Chips Ice chips: Within functional limits   Thin Liquid Thin Liquid: Within functional limits    Nectar Thick Nectar Thick Liquid: Not tested   Honey Thick Honey Thick Liquid: Not tested   Puree Puree: Within functional limits   Solid     Solid: Within functional limits      Juan Quam Laurice 02/01/2022,3:08 PM Estill Bamberg L.  Tivis Ringer, MA CCC/SLP Clinical Specialist - [place] Office number [phone]

## 2022-02-01 NOTE — Plan of Care (Signed)
  Problem: Fluid Volume: Goal: Hemodynamic stability will improve Outcome: Progressing   

## 2022-02-02 ENCOUNTER — Inpatient Hospital Stay (HOSPITAL_COMMUNITY): Payer: Medicare PPO

## 2022-02-02 DIAGNOSIS — I5031 Acute diastolic (congestive) heart failure: Secondary | ICD-10-CM

## 2022-02-02 DIAGNOSIS — N390 Urinary tract infection, site not specified: Secondary | ICD-10-CM | POA: Diagnosis not present

## 2022-02-02 DIAGNOSIS — A419 Sepsis, unspecified organism: Secondary | ICD-10-CM | POA: Diagnosis not present

## 2022-02-02 DIAGNOSIS — Z7189 Other specified counseling: Secondary | ICD-10-CM

## 2022-02-02 DIAGNOSIS — F03A0 Unspecified dementia, mild, without behavioral disturbance, psychotic disturbance, mood disturbance, and anxiety: Secondary | ICD-10-CM

## 2022-02-02 LAB — ECHOCARDIOGRAM COMPLETE
AV Mean grad: 6 mmHg
AV Peak grad: 10.5 mmHg
Ao pk vel: 1.62 m/s
Area-P 1/2: 4.39 cm2
Calc EF: 76.8 %
Height: 69 in
MV M vel: 1.44 m/s
MV Peak grad: 8.3 mmHg
Single Plane A2C EF: 72.4 %
Single Plane A4C EF: 80 %
Weight: 2320 oz

## 2022-02-02 LAB — CBC WITH DIFFERENTIAL/PLATELET
Abs Immature Granulocytes: 0.59 10*3/uL — ABNORMAL HIGH (ref 0.00–0.07)
Basophils Absolute: 0.1 10*3/uL (ref 0.0–0.1)
Basophils Relative: 0 %
Eosinophils Absolute: 0.4 10*3/uL (ref 0.0–0.5)
Eosinophils Relative: 2 %
HCT: 35.8 % — ABNORMAL LOW (ref 39.0–52.0)
Hemoglobin: 12.3 g/dL — ABNORMAL LOW (ref 13.0–17.0)
Immature Granulocytes: 3 %
Lymphocytes Relative: 6 %
Lymphs Abs: 1.1 10*3/uL (ref 0.7–4.0)
MCH: 30.2 pg (ref 26.0–34.0)
MCHC: 34.4 g/dL (ref 30.0–36.0)
MCV: 88 fL (ref 80.0–100.0)
Monocytes Absolute: 2.1 10*3/uL — ABNORMAL HIGH (ref 0.1–1.0)
Monocytes Relative: 10 %
Neutro Abs: 15.7 10*3/uL — ABNORMAL HIGH (ref 1.7–7.7)
Neutrophils Relative %: 79 %
Platelets: 424 10*3/uL — ABNORMAL HIGH (ref 150–400)
RBC: 4.07 MIL/uL — ABNORMAL LOW (ref 4.22–5.81)
RDW: 13.9 % (ref 11.5–15.5)
WBC: 19.8 10*3/uL — ABNORMAL HIGH (ref 4.0–10.5)
nRBC: 0.2 % (ref 0.0–0.2)

## 2022-02-02 LAB — BRAIN NATRIURETIC PEPTIDE: B Natriuretic Peptide: 195.6 pg/mL — ABNORMAL HIGH (ref 0.0–100.0)

## 2022-02-02 LAB — BASIC METABOLIC PANEL
Anion gap: 12 (ref 5–15)
BUN: 38 mg/dL — ABNORMAL HIGH (ref 8–23)
CO2: 24 mmol/L (ref 22–32)
Calcium: 8.6 mg/dL — ABNORMAL LOW (ref 8.9–10.3)
Chloride: 105 mmol/L (ref 98–111)
Creatinine, Ser: 1.47 mg/dL — ABNORMAL HIGH (ref 0.61–1.24)
GFR, Estimated: 45 mL/min — ABNORMAL LOW (ref >60)
Glucose, Bld: 100 mg/dL — ABNORMAL HIGH (ref 70–99)
Potassium: 3.3 mmol/L — ABNORMAL LOW (ref 3.5–5.1)
Sodium: 141 mmol/L (ref 135–145)

## 2022-02-02 LAB — MAGNESIUM: Magnesium: 2.2 mg/dL (ref 1.7–2.4)

## 2022-02-02 MED ORDER — METOPROLOL TARTRATE 25 MG PO TABS
25.0000 mg | ORAL_TABLET | Freq: Two times a day (BID) | ORAL | Status: DC
  Administered 2022-02-04 – 2022-02-06 (×5): 25 mg via ORAL
  Filled 2022-02-02 (×8): qty 1

## 2022-02-02 MED ORDER — POTASSIUM CHLORIDE CRYS ER 10 MEQ PO TBCR
40.0000 meq | EXTENDED_RELEASE_TABLET | Freq: Once | ORAL | Status: AC
  Administered 2022-02-02: 40 meq via ORAL
  Filled 2022-02-02: qty 4

## 2022-02-02 MED ORDER — MIDODRINE HCL 5 MG PO TABS
10.0000 mg | ORAL_TABLET | Freq: Three times a day (TID) | ORAL | Status: DC
  Administered 2022-02-02 (×2): 10 mg via ORAL
  Filled 2022-02-02 (×2): qty 2

## 2022-02-02 MED ORDER — LACTATED RINGERS IV SOLN: INTRAVENOUS | Status: AC

## 2022-02-02 MED ORDER — METOPROLOL TARTRATE 25 MG PO TABS
25.0000 mg | ORAL_TABLET | Freq: Once | ORAL | Status: AC
  Administered 2022-02-02: 25 mg via ORAL
  Filled 2022-02-02: qty 1

## 2022-02-02 MED ORDER — PERFLUTREN LIPID MICROSPHERE
1.0000 mL | INTRAVENOUS | Status: AC | PRN
  Administered 2022-02-02: 2 mL via INTRAVENOUS

## 2022-02-02 MED ORDER — MIDODRINE HCL 5 MG PO TABS: 5.0000 mg | ORAL_TABLET | Freq: Three times a day (TID) | ORAL | Status: DC

## 2022-02-02 MED ORDER — METOPROLOL TARTRATE 12.5 MG HALF TABLET
12.5000 mg | ORAL_TABLET | Freq: Two times a day (BID) | ORAL | Status: DC
  Filled 2022-02-02: qty 1

## 2022-02-02 MED ORDER — MIDODRINE HCL 5 MG PO TABS
5.0000 mg | ORAL_TABLET | Freq: Two times a day (BID) | ORAL | Status: DC
  Administered 2022-02-02 – 2022-02-03 (×3): 5 mg via ORAL
  Filled 2022-02-02 (×3): qty 1

## 2022-02-02 NOTE — Consult Note (Signed)
Consultation Note Date: 02/02/2022   Patient Name: Tommy Browning.  DOB: 02-17-1930  MRN: [id number]  Age / Sex: 88 y.o., male  PCP: Hoyt Koch, MD Referring Physician: Thurnell Lose, MD  Reason for Consultation: 88 yr old, frail with some dementia, Uro sepsis, GOC  HPI/Patient Profile: 88 y.o. male  with past medical history of mild dementia, hypothyroid, BPH admitted on 02/09/2022 with sepsis due to UTI likely related to urinary retention from BPH.  He has a Foley in place.  He has had some transient shortness of breath with given dose of Lasix.  Chest x-ray was clear.  Echocardiogram is pending.  Palliative medicine consulted for 88 yr old, frail with some dementia, Uro sepsis, GOC.  Primary Decision Maker HCPOA per report of daughter patient's spouse and she are her designated healthcare power of attorney needs copy of paperwork has been requested  Discussion: Chart reviewed including labs, progress notes, imaging from this and previous encounters.  Patient in bed awake alert but lethargic.  His daughter and niece were at the bedside.  He was able to identify them and tell me who they are.  Patient's spouse is at an appointment. Per daughter prior to this admission Mr. Tommy Browning was living independently at home, able to ambulate and to complete his own ADLs.  Able to recognize family members and make his needs known.  Dementia was mild. We discussed his current acute illness and how that can affect his overall chronic illnesses.  Discussed how insults of the body can worsen dementia.  Reviewed advance care planning issues that need to be discussed. I answered the patient's daughter's questions regarding discharge to possible rehab facility.  They were surprised when social worker mentioned going to a nursing home.  Patient's daughter shares that they would never want for him to reside in a nursing  home.  We discussed short-term rehab in a nursing home with the ultimate goal of returning him home.  We also discussed the importance of considering what is important to patient-if he truly would not want to go to a nursing facility could explore other options such as having PT OT come to the house.  Patient's spouse is also looking into private home care assistance. Patient's daughter is going to call me with a time to meet with her and her mom hopefully tomorrow for some further discussions of goals of care and advance care planning.    SUMMARY OF RECOMMENDATIONS   -Continue current plan of care - Have requested a copy of advance care planning documents from daughter - Daughter to call me tomorrow with a time to meet with patient's spouse and herself for further discussion of goals of care-if patient happens to be discharged tomorrow before this happens recommend outpatient palliative referral and family is in agreement with this  Code Status/Advance Care Planning: Full code   Prognosis:   Unable to determine  Discharge Planning: To Be Determined  Primary Diagnoses: Present on Admission:  BPH (benign prostatic hyperplasia)  Hypothyroidism  Review of Systems  Physical Exam  Vital Signs: BP 102/85 (BP Location: Left Arm)   Pulse (!) 104   Temp 98.4 F (36.9 C) (Oral)   Resp (!) 26   Ht '5\' 9"'$  (1.753 m)   Wt 65.8 kg   SpO2 100%   BMI 21.41 kg/m  Pain Scale: 0-10 POSS *See Group Information*: S-Acceptable,Sleep, easy to arouse Pain Score: 0-No pain   SpO2: SpO2: 100 % O2 Device:SpO2: 100 % O2 Flow Rate: .O2 Flow Rate (L/min): 2 L/min  IO: Intake/output summary:  Intake/Output Summary (Last 24 hours) at 02/02/2022 1532 Last data filed at 02/02/2022 0200 Gross per 24 hour  Intake 160 ml  Output 1200 ml  Net -1040 ml    LBM: Last BM Date : 02/01/22 Baseline Weight: Weight: 68 kg Most recent weight: Weight: 65.8 kg       Thank you for this consult. Palliative  medicine will continue to follow and assist as needed.  Time Total: 80 minutes Greater than 50%  of this time was spent counseling and coordinating care related to the above assessment and plan.  Signed by: Mariana Kaufman, AGNP-C Palliative Medicine    Please contact Palliative Medicine Team phone at [phone] for questions and concerns.  For individual provider: See Shea Evans

## 2022-02-02 NOTE — TOC Progression Note (Signed)
Transition of Care (TOC) - Progression Note    Patient Details  Name: Tommy Browning. MRN: [id number] Date of Birth: [birth year]  Transition of Care [facility]) CM/SW Contact  Cyndi Bender, RN Phone Number: 02/02/2022, 5:05 PM  Clinical Narrative:     Ordered patient hospital bed for home.  Notified [place] with [employer] of order.  Address of file is correct.    Expected Discharge Plan: [place] Barriers to Discharge: Continued Medical Work up, Equipment Delay  Expected Discharge Plan and Services Expected Discharge Plan: [place] In-house Referral: Clinical Social Work, Hospice / Palliative Care Discharge Planning Services: CM Consult Post Acute Care Choice: Home Health, Durable Medical Equipment Living arrangements for the past 2 months: Single Family Home                 DME Arranged: Hospital bed DME Agency: [employer] Date DME Agency Contacted: 02/02/22 Time DME Agency Contacted: [phone] Representative spoke with at DME Agency: Lucila Maine             Social Determinants of Health ([place]) Interventions    Readmission Risk Interventions     No data to display

## 2022-02-02 NOTE — Progress Notes (Signed)
PROGRESS NOTE                                                                                                                                                                                                             Patient Demographics:    Tommy Browning, is a 88 y.o. male, DOB - Jun 22, 1929, VFI:[id number]  Outpatient Primary MD for the patient is Hoyt Koch, MD    LOS - 2  Admit date - 02/20/2022    Chief Complaint  Patient presents with   Altered Mental Status       Brief Narrative (HPI from H&P)    88 y.o. male with medical history significant for BPH, hypothyroidism and dementia was brought in by EMS for evaluation of a one-week history of weakness and lethargy with decreased oral intake, and frequent urination that was 'very dark to red".  At baseline patient is ambulant with assistance, performs some ADLs however he has been staying in the bed and gets agitated when encouraged to eat or do anything for the last several days.  He has had poor oral intake for mths per last Neuro noted 5 mths ago, also stopped taking his Neuro Meds about 6 mths ago, in the ER he was diagnosed with urinary retention with sepsis, UTI and toxic encephalopathy and admitted.   Subjective:   Patient in bed, appears comfortable, denies any headache, no fever, no chest pain or pressure, no shortness of breath , no abdominal pain. No focal weakness.   Assessment  & Plan :   Sepsis secondary to UTI caused by urinary retention.  Underlying history of BPH. Foley placed in the ER, on empiric antibiotics, sepsis pathophysiology is improving, add Flomax with outpatient urology follow-up.  Will be discharged with Foley catheter.  Dehydration, hypernatremia and AKI.  Hydrated with IV fluids and monitor.  CT abdomen pelvis nonacute.   Acute metabolic encephalopathy in a patient with underlying dementia, extremely frail and deconditioned with  advanced age - Secondary to sepsis.  He is pretty deconditioned and largely bedbound at baseline as well, treat sepsis, PT OT and advance activity.  Involve palliative care for goals of care as well.  Hypothyroidism - Not currently on levothyroxine, stable TSH  Dementia without behavioral disturbance - Hold mirtazapine due to lethargy, delirium precautions, minimize narcotics and benzodiazepines.  Mild shortness  of breath on 02/01/2022.  Was nonspecific, question of mild fluid overload and nonspecific CHF, no echo on file, obtain echocardiogram, fluids reduced, symptoms resolved chest x-ray stable continue to monitor.       Condition -  Guarded  Family Communication  : Daughter and wife in detail on 02/01/2022, daughter at bedside on 02/02/2022  Code Status :  Full  Consults  : Negative care for goals of care  PUD Prophylaxis :    Procedures  :     TTE  CT head.  Non acute  CT abdomen pelvis -  Unremarkable CT abdomen/pelvis.      Disposition Plan  :    Status is: Inpatient  DVT Prophylaxis  :    enoxaparin (LOVENOX) injection 30 mg Start: 02/01/22 1000    Lab Results  Component Value Date   PLT 424 (H) 02/02/2022    Diet :  Diet Order             Diet regular Room service appropriate? Yes; Fluid consistency: Thin  Diet effective now                    Inpatient Medications  Scheduled Meds:  aspirin EC  81 mg Oral Daily   cyanocobalamin  1,000 mcg Oral Daily   enoxaparin (LOVENOX) injection  30 mg Subcutaneous Daily   latanoprost  1 drop Both Eyes QHS   metoprolol tartrate  12.5 mg Oral BID   midodrine  10 mg Oral TID WC   mirtazapine  7.5 mg Oral QHS   omega-3 acid ethyl esters  1 g Oral Daily   tamsulosin  0.4 mg Oral Daily   Continuous Infusions:  cefTRIAXone (ROCEPHIN)  IV 200 mL/hr at 02/02/22 0655   PRN Meds:.acetaminophen **OR** acetaminophen, HYDROcodone-acetaminophen, ondansetron **OR** ondansetron (ZOFRAN) IV  Antibiotics  :     Anti-infectives (From admission, onward)    Start     Dose/Rate Route Frequency Ordered Stop   02/01/22 2200  cefTRIAXone (ROCEPHIN) 2 g in sodium chloride 0.9 % 100 mL IVPB        2 g 200 mL/hr over 30 Minutes Intravenous Every 24 hours 02/24/2022 2322 02/08/22 2159    2330  vancomycin (VANCOREADY) IVPB 1250 mg/250 mL        1,250 mg 166.7 mL/hr over 90 Minutes Intravenous  Once 02/21/2022 2229 02/01/22 0518   02/19/2022 2230  ceFEPIme (MAXIPIME) 2 g in sodium chloride 0.9 % 100 mL IVPB        2 g 200 mL/hr over 30 Minutes Intravenous  Once 02/25/2022 2229 02/26/2022 2348         Objective:   Vitals:   02/02/22 0000 02/02/22 0245 02/02/22 0455 02/02/22 0800  BP: (!) 84/62 (!) 85/60 95/66 106/77  Pulse: (!) 106 99 99 (!) 105  Resp: '19 20 18 '$ (!) 24  Temp: 98.7 F (37.1 C) 98.4 F (36.9 C) 98.5 F (36.9 C) 98.4 F (36.9 C)  TempSrc: Oral Oral Oral Oral  SpO2: 100% 100% 100%   Weight:      Height:        Wt Readings from Last 3 Encounters:  02/01/22 65.8 kg  01/27/22 65.8 kg  09/24/21 69.3 kg     Intake/Output Summary (Last 24 hours) at 02/02/2022 0839 Last data filed at 02/02/2022 0200 Gross per 24 hour  Intake 1146.61 ml  Output 1200 ml  Net -53.39 ml     Physical Exam  Sleeping but easily arousable,  oriented x2, no focal deficits, Foley in place, frail elderly male, [place].AT,PERRAL Supple Neck, No JVD,   Symmetrical Chest wall movement, Good air movement bilaterally, CTAB RRR,No Gallops, Rubs or new Murmurs,  +ve B.Sounds, Abd Soft, No tenderness,   No Cyanosis, Clubbing or edema      Data Review:    CBC Recent Labs  Lab 02/26/2022 2123 02/01/22 0230 02/02/22 0447  WBC 19.2* 20.6* 19.8*  HGB 14.7 13.2 12.3*  HCT 43.2 41.6 35.8*  PLT 461* 489* 424*  MCV 89.6 92.4 88.0  MCH 30.5 29.3 30.2  MCHC 34.0 31.7 34.4  RDW 13.8 13.9 13.9  LYMPHSABS 0.9  --  1.1  MONOABS 2.0*  --  2.1*  EOSABS 0.1  --  0.4  BASOSABS 0.0  --  0.1     Electrolytes Recent Labs  Lab 02/19/2022 2123 02/01/22 0230 02/02/22 0447  NA 147* 147* 141  K 3.8 4.1 3.3*  CL 106 109 105  CO2 '26 27 24  '$ GLUCOSE 132* 116* 100*  BUN 49* 49* 38*  CREATININE 2.06* 1.83* 1.47*  CALCIUM 9.7 9.3 8.6*  MG  --   --  2.2  TSH  --  1.216  --   BNP  --   --  195.6*      Radiology Reports DG Chest Port 1 View  Result Date: 02/02/2022 CLINICAL DATA:  88 year old male with history of shortness of breath. EXAM: PORTABLE CHEST 1 VIEW COMPARISON:  Chest x-ray 02/01/2022. FINDINGS: Lung volumes are normal. No consolidative airspace disease. No pleural effusions. No pneumothorax. No pulmonary nodule or mass noted. Pulmonary vasculature and the cardiomediastinal silhouette are within normal limits. IMPRESSION: No radiographic evidence of acute cardiopulmonary disease. Electronically Signed   By: Vinnie Langton M.D.   On: 02/02/2022 07:09   DG Chest Port 1 View  Result Date: 02/01/2022 CLINICAL DATA:  Shortness of breath EXAM: PORTABLE CHEST 1 VIEW COMPARISON:  01/28/2022 FINDINGS: Lungs are clear.  No pleural effusion or pneumothorax. The heart is normal in size. IMPRESSION: No evidence of acute cardiopulmonary disease. Electronically Signed   By: Julian Hy M.D.   On: 02/01/2022 20:03   CT ABDOMEN PELVIS WO CONTRAST  Result Date: 02/01/2022 CLINICAL DATA:  Kidney failure weakness, decreased appetite, dementia EXAM: CT ABDOMEN AND PELVIS WITHOUT CONTRAST TECHNIQUE: Multidetector CT imaging of the abdomen and pelvis was performed following the standard protocol without IV contrast. RADIATION DOSE REDUCTION: This exam was performed according to the departmental dose-optimization program which includes automated exposure control, adjustment of the mA and/or kV according to patient size and/or use of iterative reconstruction technique. COMPARISON:  CT chest abdomen pelvis dated 04/04/2021. FINDINGS: Lower chest: Mild right basilar atelectasis. Hepatobiliary:  Unenhanced liver is unremarkable. Gallbladder is unremarkable. No intrahepatic or extrahepatic duct dilatation. Pancreas: Within normal limits. Spleen: Within normal limits. Adrenals/Urinary Tract: Adrenal glands are within normal limits. 3.7 cm left lower pole renal cyst (series 3/image 39), measuring simple fluid density, benign (Bosniak I). No follow-up is recommended. Kidneys are otherwise within normal limits.  No hydronephrosis. Bladder is decompressed by an indwelling Foley catheter. Stomach/Bowel: Stomach is within normal limits. No evidence of bowel obstruction. Normal appendix (series 3/image 62). Scattered mild colonic diverticulosis, without evidence of diverticulitis. Vascular/Lymphatic: No evidence of abdominal aortic aneurysm. Atherosclerotic calcifications of the abdominal aorta and branch vessels. No suspicious abdominopelvic lymphadenopathy. Reproductive: Prostate is grossly unremarkable. Other: No abdominopelvic ascites. Musculoskeletal: Mild degenerative changes of the lower thoracic spine. IMPRESSION: Unremarkable CT abdomen/pelvis. Electronically Signed  By: Julian Hy M.D.   On: 02/01/2022 01:44   CT HEAD WO CONTRAST (5MM)  Result Date: 02/26/2022 CLINICAL DATA:  Mental status change, unknown cause EXAM: CT HEAD WITHOUT CONTRAST TECHNIQUE: Contiguous axial images were obtained from the base of the skull through the vertex without intravenous contrast. RADIATION DOSE REDUCTION: This exam was performed according to the departmental dose-optimization program which includes automated exposure control, adjustment of the mA and/or kV according to patient size and/or use of iterative reconstruction technique. COMPARISON:  None Available. FINDINGS: Brain: Generalized atrophy is normal for age. There is moderate to advanced periventricular and deep white matter hypodensity typical of chronic small vessel ischemia. Remote lacunar infarcts in the right basal ganglia. No evidence of acute  ischemia. No hemorrhage. No subdural or extra-axial collection. No hydrocephalus, midline shift or mass effect. Vascular: Atherosclerosis of skullbase vasculature without hyperdense vessel or abnormal calcification. Skull: No fracture or focal lesion. Sinuses/Orbits: No acute findings.  Bilateral intersection. Other: None. IMPRESSION: 1. No acute intracranial abnormality. 2. Generalized atrophy and chronic small vessel ischemia. Remote lacunar infarcts in the right basal ganglia. Electronically Signed   By: Keith Rake M.D.   On: 02/05/2022 21:54   DG Chest Portable 1 View  Result Date: 02/24/2022 CLINICAL DATA:  Altered mental status EXAM: PORTABLE CHEST 1 VIEW COMPARISON:  Radiographs 04/05/2017 FINDINGS: No focal consolidation, pleural effusion, or pneumothorax. Normal cardiomediastinal silhouette. No acute osseous abnormality. IMPRESSION: No active disease. Electronically Signed   By: Placido Sou M.D.   On: 02/03/2022 21:49      Signature  Lala Lund M.D on 02/02/2022 at 8:39 AM   -  To page go to [email or web address]

## 2022-02-02 NOTE — NC FL2 (Signed)
[place] LEVEL OF CARE SCREENING TOOL     IDENTIFICATION  Patient Name: Tommy Browning. Birthdate: 1929-06-11 Sex: male Admission Date (Current Location): 02/07/2022  [facility] and [place] Number:  Herbalist and Address:  [facility] [place], [place] [address], [place], [place] [address]      Provider Number: [id number]  Attending Physician Name and Address:  Thurnell Lose, MD  Relative Name and Phone Number:       Current Level of Care: Hospital Recommended Level of Care: [facility] Prior Approval Number:    Date Approved/Denied:   PASRR Number: [id number]  Discharge Plan: SNF    Current Diagnoses: Patient Active Problem List   Diagnosis Date Noted   Acute urinary retention 02/21/2022   Sepsis secondary to UTI ([place]) 02/17/2022   Dementia without behavioral disturbance ([place]) 32/02/2481   Acute metabolic encephalopathy 50/05/7046   Hypothyroidism 02/19/2022   AKI (acute kidney injury) ([place]) 02/14/2022   Nausea 05/31/2020   Memory disorder 05/01/2016   Loss of weight 07/23/2015   Graves' eye disease 04/16/2011   Routine general medical examination at a health care facility 01/27/2011   HYPOGONADISM 02/03/2010   Hyperlipidemia 05/30/2009   BPH (benign prostatic hyperplasia) 02/26/2009   ELECTROCARDIOGRAM, ABNORMAL 12/08/2006    Orientation RESPIRATION BLADDER Height & Weight     Self, Situation  O2 (2L nasal cannula) Incontinent, Indwelling catheter Weight: 145 lb (65.8 kg) Height:  '5\' 9"'$  (175.3 cm)  BEHAVIORAL SYMPTOMS/MOOD NEUROLOGICAL BOWEL NUTRITION STATUS      Continent Diet (See dc summary)  AMBULATORY STATUS COMMUNICATION OF NEEDS Skin   Extensive Assist Verbally Normal                       Personal Care Assistance Level of Assistance  Bathing, Feeding, Dressing Bathing Assistance: Limited assistance Feeding assistance: Limited assistance Dressing Assistance: Limited assistance      Functional Limitations Info             [place]  PT (By licensed PT), OT (By licensed OT)     PT Frequency: 5x/week OT Frequency: 5x/week            Contractures Contractures Info: Not present    Additional Factors Info  Code Status, Allergies Code Status Info: Full Allergies Info: NKA           Current Medications (02/02/2022):  This is the current hospital active medication list Current Facility-Administered Medications  Medication Dose Route Frequency Provider Last Rate Last Admin   acetaminophen (TYLENOL) tablet 650 mg  650 mg Oral Q6H PRN Athena Masse, MD       Or   acetaminophen (TYLENOL) suppository 650 mg  650 mg Rectal Q6H PRN Athena Masse, MD       aspirin EC tablet 81 mg  81 mg Oral Daily Judd Gaudier V, MD   81 mg at 02/02/22 0952   cefTRIAXone (ROCEPHIN) 2 g in sodium chloride 0.9 % 100 mL IVPB  2 g Intravenous Q24H Judd Gaudier V, MD 200 mL/hr at 02/02/22 0655 Rate Change at 02/02/22 0655   cyanocobalamin (VITAMIN B12) tablet 1,000 mcg  1,000 mcg Oral Daily Thurnell Lose, MD   1,000 mcg at 02/02/22 0952   enoxaparin (LOVENOX) injection 30 mg  30 mg Subcutaneous Daily Athena Masse, MD   30 mg at 02/02/22 0953   HYDROcodone-acetaminophen (NORCO/VICODIN) 5-325 MG per tablet 1-2 tablet  1-2 tablet Oral Q4H PRN Athena Masse, MD   1 tablet at 02/01/22 0300   lactated ringers infusion   Intravenous Continuous Thurnell Lose, MD 50 mL/hr at 02/02/22 0959 New Bag at 02/02/22 0959   latanoprost (XALATAN) 0.005 % ophthalmic solution 1 drop  1 drop Both Eyes QHS Judd Gaudier V, MD   1 drop at 02/01/22 2304   metoprolol tartrate (LOPRESSOR) tablet 12.5 mg  12.5 mg Oral BID Thurnell Lose, MD       midodrine (PROAMATINE) tablet 10 mg  10 mg Oral TID WC Thurnell Lose, MD   10 mg at 02/02/22 1302   mirtazapine (REMERON) tablet 7.5 mg  7.5 mg Oral QHS Judd Gaudier V, MD   7.5 mg at 02/01/22 2143   omega-3 acid ethyl  esters (LOVAZA) capsule 1 g  1 g Oral Daily Thurnell Lose, MD   1 g at 02/02/22 0952   ondansetron (ZOFRAN) tablet 4 mg  4 mg Oral Q6H PRN Athena Masse, MD       Or   ondansetron [facility]) injection 4 mg  4 mg Intravenous Q6H PRN Athena Masse, MD       perflutren lipid microspheres (DEFINITY) IV suspension  1-10 mL Intravenous PRN Thurnell Lose, MD   2 mL at 02/02/22 1313   tamsulosin (FLOMAX) capsule 0.4 mg  0.4 mg Oral Daily Thurnell Lose, MD   0.4 mg at 02/02/22 4166     Discharge Medications: Please see discharge summary for a list of discharge medications.  Relevant Imaging Results:  Relevant Lab Results:   Additional Information SSN: [id number]  [place], [place]

## 2022-02-02 NOTE — Evaluation (Signed)
Physical Therapy Evaluation Patient Details Name: Tommy Browning. MRN: [id number] DOB: 1929/08/05 Today's Date: 02/02/2022  History of Present Illness  Pt is a 88 yo male admitted with weakness, lethargy, not eating and dark urine. Pt founc to have UTI and toxic encephalopathy as well as sepsis. PMH: BPH, hypothyroid, dementia.  Clinical Impression  Pt admitted with above diagnosis and presents to PT with functional limitations due to deficits listed below (See PT problem list). Pt needs skilled PT to maximize independence and safety and recommend SNF for further rehab. Pt debilitated and fatigues quickly. Lives with elderly wife so will need to be fairly mobile prior to eventual return home.         Recommendations for follow up therapy are one component of a multi-disciplinary discharge planning process, led by the attending physician.  Recommendations may be updated based on patient status, additional functional criteria and insurance authorization.  Follow Up Recommendations Skilled nursing-short term rehab (<3 hours/day) Can patient physically be transported by private vehicle: Yes    Assistance Recommended at Discharge Frequent or constant Supervision/Assistance  Patient can return home with the following  A lot of help with walking and/or transfers;A lot of help with bathing/dressing/bathroom;Assist for transportation;Help with stairs or ramp for entrance;Direct supervision/assist for medications management    Equipment Recommendations Rolling walker (2 wheels)  Recommendations for Other Services       Functional Status Assessment Patient has had a recent decline in their functional status and demonstrates the ability to make significant improvements in function in a reasonable and predictable amount of time.     Precautions / Restrictions Precautions Precautions: Fall;Other (comment) (increased HR) Precaution Comments: monitor HR with activity. Up to 135  standing. Restrictions Weight Bearing Restrictions: No      Mobility  Bed Mobility Overal bed mobility: Needs Assistance Bed Mobility: Sit to Supine       Sit to supine: Min assist   General bed mobility comments: Assist to guide trunk and legs as pt wants to flop straight backwards from EOB.    Transfers Overall transfer level: Needs assistance Equipment used: Rolling walker (2 wheels) Transfers: Sit to/from Stand Sit to Stand: Min assist           General transfer comment: Assist for stability/balance on rising. Verbal cues for hand placement    Ambulation/Gait Ambulation/Gait assistance: Min assist Gait Distance (Feet): 5 Feet Assistive device: Rolling walker (2 wheels) Gait Pattern/deviations: Step-through pattern, Decreased step length - right, Decreased step length - left, Shuffle, Trunk flexed Gait velocity: decr Gait velocity interpretation: <1.31 ft/sec, indicative of household ambulator   General Gait Details: Assist for balance and support. Pt fatigued very quickly and unable to amb further distance  Stairs            Wheelchair Mobility    Modified Rankin (Stroke Patients Only)       Balance Overall balance assessment: Needs assistance Sitting-balance support: Feet supported Sitting balance-Leahy Scale: Fair     Standing balance support: During functional activity, Bilateral upper extremity supported Standing balance-Leahy Scale: Poor Standing balance comment: Walker and min guard for static standing                             Pertinent Vitals/Pain Pain Assessment Pain Assessment: No/denies pain    Home Living Family/patient expects to be discharged to:: Private residence Living Arrangements: Spouse/significant other Available Help at Discharge: Family;Available 24 hours/day Type of  Home: House Home Access: Stairs to enter   [employer] of Steps: 2 Alternate Level Stairs-Number of Steps: 6 Home Layout:  Multi-level Home Equipment: None Additional Comments: family member working on Haematologist    Prior Function Prior Level of Function : Independent/Modified Independent             Mobility Comments: no assitive device and independent prior to 10 days ago ADLs Comments: bathes self in shower, dress self, toilets and feeds self Ily prior to 10 days ago     Hand Dominance   Dominant Hand: Right    Extremity/Trunk Assessment   Upper Extremity Assessment Upper Extremity Assessment: Defer to OT evaluation    Lower Extremity Assessment Lower Extremity Assessment: Generalized weakness    Cervical / Trunk Assessment Cervical / Trunk Assessment: Kyphotic  Communication   Communication: No difficulties  Cognition Arousal/Alertness: Awake/alert Behavior During Therapy: Anxious Overall Cognitive Status: Impaired/Different from baseline Area of Impairment: Orientation, Attention, Memory, Safety/judgement, Awareness, Problem solving                 Orientation Level: Situation, Time Current Attention Level: Sustained Memory: Decreased short-term memory   Safety/Judgement: Decreased awareness of safety, Decreased awareness of deficits Awareness: Intellectual Problem Solving: Slow processing, Decreased initiation, Requires verbal cues General Comments: Pt has baseline demential but this current state is worse than normal. pt usually knows month and year.        General Comments General comments (skin integrity, edema, etc.): Sitting in chair HR 110-115. HR with activity HR 135. Pt on 3L with resting SpO2 100%. Unable to get reading with activity    Exercises     Assessment/Plan    PT Assessment Patient needs continued PT services  PT Problem List Decreased strength;Decreased activity tolerance;Decreased balance;Decreased mobility;Decreased cognition;Decreased safety awareness       PT Treatment Interventions DME instruction;Gait training;Stair  training;Functional mobility training;Therapeutic activities;Therapeutic exercise;Balance training;Cognitive remediation;Patient/family education    PT Goals (Current goals can be found in the Care Plan section)  Acute Rehab PT Goals Patient Stated Goal: not stated PT Goal Formulation: With patient/family Time For Goal Achievement: 02/16/22 Potential to Achieve Goals: Good    Frequency Min 3X/week     Co-evaluation               AM-PAC PT "6 Clicks" Mobility  Outcome Measure Help needed turning from your back to your side while in a flat bed without using bedrails?: A Little Help needed moving from lying on your back to sitting on the side of a flat bed without using bedrails?: A Little Help needed moving to and from a bed to a chair (including a wheelchair)?: A Little Help needed standing up from a chair using your arms (e.g., wheelchair or bedside chair)?: A Little Help needed to walk in hospital room?: Total Help needed climbing 3-5 steps with a railing? : Total 6 Click Score: 14    End of Session Equipment Utilized During Treatment: Gait belt;Oxygen Activity Tolerance: Patient limited by fatigue Patient left: in bed;with call bell/phone within reach;with bed alarm set;with family/visitor present   PT Visit Diagnosis: Unsteadiness on feet (R26.81);Other abnormalities of gait and mobility (R26.89);Muscle weakness (generalized) (M62.81)    Time: 9390-3009 PT Time Calculation (min) (ACUTE ONLY): 20 min   Charges:   PT Evaluation $PT Eval Moderate Complexity: [place] [facility] [place] 02/02/2022,  1:48 PM

## 2022-02-02 NOTE — Progress Notes (Signed)
   Durable Medical Equipment (From admission, onward)        Start     Ordered  02/02/22 1652  For home use only DME Hospital bed  Once      Question Answer Comment Length of Need Lifetime  Patient has (list medical condition): dementia, sepsis  The above medical condition requires: Patient requires the ability to reposition frequently  Bed type Semi-electric  Support Surface: Gel Overlay    02/02/22 1652

## 2022-02-02 NOTE — Evaluation (Signed)
Occupational Therapy Evaluation Patient Details Name: Tommy Browning. MRN: [id number] DOB: 07-07-1929 Today's Date: 02/02/2022   History of Present Illness Pt is a 88 yo male admitted with weakness, lethargy, not eating and dark urine. Pt founc to have UTI and toxic encephalopathy as well as sepsis. PMH: BPH, hypothyroid, dementia.   Clinical Impression   Pt admitted with the above diagnosis and has the deficits listed below. Pt would benefit from cont OT to increase independence with basic adls and adl transfers back to baseline which is supervision to mod I. Pt is very fatigued at this time with HR up to 135 with activity and could not tolerate ambulation during session. Pt stood and immediately needed to sit back down due to high HR and no tolerance to stand. Pt lives with wife at home but wife unable to physically assist very much. Feel SNF may be best d/c plan due to poor appetite, failure to thrive, and significant decrease in functional status over the last 10-14 days.  Will continue to see with focus on OOB activities and adls.      Recommendations for follow up therapy are one component of a multi-disciplinary discharge planning process, led by the attending physician.  Recommendations may be updated based on patient status, additional functional criteria and insurance authorization.   Follow Up Recommendations  Skilled nursing-short term rehab (<3 hours/day)    Assistance Recommended at Discharge Frequent or constant Supervision/Assistance  Patient can return home with the following A little help with walking and/or transfers;A little help with bathing/dressing/bathroom;Assistance with cooking/housework;Assist for transportation;Help with stairs or ramp for entrance;Direct supervision/assist for medications management;Direct supervision/assist for financial management    Functional Status Assessment  Patient has had a recent decline in their functional status and demonstrates the  ability to make significant improvements in function in a reasonable and predictable amount of time.  Equipment Recommendations  None recommended by OT    Recommendations for Other Services       Precautions / Restrictions Precautions Precautions: Fall;Other (comment) (increased HR) Precaution Comments: monitor HR with activity. Up to 135 standing. Restrictions Weight Bearing Restrictions: No      Mobility Bed Mobility               General bed mobility comments: Pt in chair on arrival and at end of session    Transfers Overall transfer level: Needs assistance Equipment used: Rolling walker (2 wheels) Transfers: Sit to/from Stand Sit to Stand: Min guard           General transfer comment: cues to push off chair when standing and reach back for chair when sitting.      Balance Overall balance assessment: Needs assistance Sitting-balance support: Feet supported Sitting balance-Leahy Scale: Fair     Standing balance support: During functional activity, Bilateral upper extremity supported Standing balance-Leahy Scale: Poor Standing balance comment: Pt reliant on walker when standing. pt was SOB in standing and fatigued asking to sit down immediately after getting up.                           ADL either performed or assessed with clinical judgement   ADL Overall ADL's : Needs assistance/impaired Eating/Feeding: Set up;Sitting   Grooming: Wash/dry hands;Wash/dry face;Oral care;Set up;Sitting   Upper Body Bathing: Set up;Sitting   Lower Body Bathing: Minimal assistance;Sit to/from stand;Cueing for compensatory techniques   Upper Body Dressing : Minimal assistance;Sitting   Lower Body Dressing: Moderate  assistance;Sit to/from stand;Cueing for compensatory techniques   Toilet Transfer: Minimal assistance;BSC/3in1;Rolling walker (2 wheels);Stand-pivot Armed forces technical officer Details (indicate cue type and reason): Pt could not tolerate walking to bathroom.  Pt with HR up to 135 just standing. Toileting- Clothing Manipulation and Hygiene: Minimal assistance;Sit to/from stand;Cueing for compensatory techniques       Functional mobility during ADLs: Minimal assistance;Rolling walker (2 wheels) General ADL Comments: Pt most limited by general fatigue and high HR with activity     Vision Baseline Vision/History: 0 No visual deficits Ability to See in Adequate Light: 0 Adequate Patient Visual Report: No change from baseline Vision Assessment?: No apparent visual deficits     Perception     Praxis      Pertinent Vitals/Pain Pain Assessment Pain Assessment: No/denies pain     Hand Dominance Right   Extremity/Trunk Assessment Upper Extremity Assessment Upper Extremity Assessment: Overall WFL for tasks assessed   Lower Extremity Assessment Lower Extremity Assessment: Defer to PT evaluation   Cervical / Trunk Assessment Cervical / Trunk Assessment: Kyphotic   Communication Communication Communication: No difficulties   Cognition Arousal/Alertness: Awake/alert Behavior During Therapy: Anxious Overall Cognitive Status: Impaired/Different from baseline Area of Impairment: Orientation, Attention, Memory, Safety/judgement, Awareness, Problem solving                 Orientation Level: Situation, Time Current Attention Level: Sustained Memory: Decreased short-term memory   Safety/Judgement: Decreased awareness of safety, Decreased awareness of deficits Awareness: Intellectual Problem Solving: Slow processing, Decreased initiation, Requires verbal cues General Comments: Pt has baseline demential but this current state is worse than normal. pt usually knows month and year.     General Comments  Pt very limited with activity tolerance. Pt in chair on arrival and appears uncomfortable although states he is not in pain. Pt wanting to get back in chair. Pt has been up for awhile prior to OT coming.  HR up into 120s sitting. Spoke  with nursing and PT to work with pt and then get him back in bed.    Exercises     Shoulder Instructions      Home Living Family/patient expects to be discharged to:: Private residence Living Arrangements: Spouse/significant other Available Help at Discharge: Family;Available 24 hours/day Type of Home: House Home Access: Stairs to enter [employer] of Steps: 2   Home Layout: Multi-level Alternate Level Stairs-Number of Steps: 6 Alternate Level Stairs-Rails: Right Bathroom Shower/Tub: Tub/shower unit;Curtain;Other (comment) (stands and occasionally gets into bottom of tub to bathe)   Bathroom Toilet: Standard     Home Equipment: None   Additional Comments: family member working on Haematologist      Prior Functioning/Environment Prior Level of Function : Independent/Modified Independent             Mobility Comments: no assitive device and independent prior to 10 days ago ADLs Comments: bathes self in shower, dress self, toilets and feeds self Ily prior to 10 days ago        OT Problem List: Decreased activity tolerance;Impaired balance (sitting and/or standing);Decreased cognition;Decreased safety awareness;Decreased knowledge of use of DME or AE;Cardiopulmonary status limiting activity      OT Treatment/Interventions: Self-care/ADL training;Therapeutic activities;Balance training;Energy conservation    OT Goals(Current goals can be found in the care plan section) Acute Rehab OT Goals Patient Stated Goal: to be stronger than this OT Goal Formulation: With patient/family Time For Goal Achievement: 02/16/22 Potential to Achieve Goals: Fair ADL Goals Pt Will Perform Grooming: with supervision;standing  Pt Will Perform Lower Body Bathing: with supervision;sit to/from stand Pt Will Perform Lower Body Dressing: with supervision;sit to/from stand Pt Will Perform Tub/Shower Transfer: Tub transfer;with min guard assist;rolling walker;shower  seat Additional ADL Goal #1: Pt will walk to bathroom and complete all toileting tasks with supervision and walker.  OT Frequency: Min 2X/week    Co-evaluation              AM-PAC OT "6 Clicks" Daily Activity     Outcome Measure Help from another person eating meals?: None Help from another person taking care of personal grooming?: None Help from another person toileting, which includes using toliet, bedpan, or urinal?: A Little Help from another person bathing (including washing, rinsing, drying)?: A Little Help from another person to put on and taking off regular upper body clothing?: A Little Help from another person to put on and taking off regular lower body clothing?: A Little 6 Click Score: 20   End of Session Equipment Utilized During Treatment: Rolling walker (2 wheels);Oxygen Nurse Communication: Mobility status  Activity Tolerance: Patient limited by fatigue Patient left: in chair;with call bell/phone within reach;with chair alarm set  OT Visit Diagnosis: Unsteadiness on feet (R26.81)                Time: 0037-0488 OT Time Calculation (min): 25 min Charges:  OT General Charges $OT Visit: 1 Visit OT Evaluation $OT Eval Moderate Complexity: 1 Mod  Glenford Peers 02/02/2022, 11:43 AM

## 2022-02-02 NOTE — TOC Progression Note (Signed)
Transition of Care (TOC) - Progression Note    Patient Details  Name: Tommy Browning. MRN: [id number] Date of Birth: [birth year]  Transition of Care [facility]) CM/SW [place], LCSW Phone Number: 02/02/2022, 4:59 PM  Clinical Narrative:    CSW met with patient's spouse, daughter Geisen, and niece Charlene at bedside with son on the phone.  CSW alerted them of SNF recommendation. Patient's spouse reported that she does not want him to go to a SNF facility and she would like home health services instead. They have spoken with someone named Colletta [place] (p. [phone]) who works for a home agency and can provide therapies. CSW discussed equipment needs and they requested a hospital bed. Patient already has a walker and they declined wheelchair at this time. RNCM reached out to [employer] for delivery of hospital bed. CSW confirmed PCP and address. They request PTAR for transport at discharge.     Expected Discharge Plan: [place] Barriers to Discharge: Continued Medical Work up, Equipment Delay  Expected Discharge Plan and Services Expected Discharge Plan: [place] In-house Referral: Clinical Social Work, Hospice / Palliative Care Discharge Planning Services: CM Consult Post Acute Care Choice: Home Health, Durable Medical Equipment Living arrangements for the past 2 months: Single Family Home                 DME Arranged: Hospital bed DME Agency: [employer] Date DME Agency Contacted: 02/02/22 Time DME Agency Contacted: [phone] Representative spoke with at DME Agency: Lucila [place]             Social Determinants of Health ([place]) Interventions    Readmission Risk Interventions     No data to display

## 2022-02-02 NOTE — Plan of Care (Signed)
  Problem: Fluid Volume: Goal: Hemodynamic stability will improve Outcome: Progressing   

## 2022-02-02 NOTE — Progress Notes (Incomplete)
Echocardiogram 2D Echocardiogram has been performed.  Ronny Flurry 02/02/2022, 1:14 PM

## 2022-02-02 NOTE — Progress Notes (Signed)
  Transition of Care [facility]) Screening Note   Patient Details  Name: Tommy Browning. Date of Birth: 11-25-1929   Transition of Care [facility]) CM/SW Contact:    Cyndi Bender, RN Phone Number: 02/02/2022, 8:31 AM    Transition of Care Department [facility]) has reviewed patient and no TOC needs have been identified at this time. We will continue to monitor patient advancement through interdisciplinary progression rounds. If new patient transition needs arise, please place a TOC consult.

## 2022-02-02 NOTE — TOC Initial Note (Signed)
Transition of Care (TOC) - Initial/Assessment Note    Patient Details  Name: Tommy Browning. MRN: [id number] Date of Birth: 17-Apr-1929  Transition of Care [facility]) CM/SW Contact:    Barton Fanny, [place] Work Phone Number: 02/02/2022, 2:36 PM  Clinical Narrative:                 MSW intern spoke with patient and family at bedside. MSW asked patient for permission to speak with family in the room, patient stated he did not mind. MSW intern advised she was hoping to discuss the discharge plan with the patient and family. The daughters advised patient's wife had an appointment and would not be back in the room until around 4pm or so. MSW intern will check back in with patient's wife.  Expected Discharge Plan: Skilled Nursing Facility Barriers to Discharge: Continued Medical Work up   Patient Goals and CMS Choice   CMS Medicare.gov Compare Post Acute Care list provided to:: Patient    Expected Discharge Plan and Services Expected Discharge Plan: [place]       Living arrangements for the past 2 months: Single Family Home                                      Prior Living Arrangements/Services Living arrangements for the past 2 months: Single Family Home Lives with:: Spouse   Do you feel safe going back to the place where you live?: Yes      Need for Family Participation in Patient Care: Yes (Comment) Care giver support system in place?: Yes (comment)   Criminal Activity/Legal Involvement Pertinent to Current Situation/Hospitalization: No - Comment as needed  Activities of Daily Living Home Assistive Devices/Equipment: None ADL Screening (condition at time of admission) Patient's cognitive ability adequate to safely complete daily activities?: Yes Is the patient deaf or have difficulty hearing?: No Does the patient have difficulty seeing, even when wearing glasses/contacts?: No (wears readers) Patient able to express need for assistance with  ADLs?: Yes Does the patient have difficulty dressing or bathing?: No Independently performs ADLs?: Yes (appropriate for developmental age) Does the patient have difficulty walking or climbing stairs?: No Weakness of Legs: None Weakness of Arms/Hands: None  Permission Sought/Granted                  Emotional Assessment Appearance:: Appears stated age Attitude/Demeanor/Rapport: Engaged Affect (typically observed): Accepting Orientation: : Oriented to Self, Oriented to Situation Alcohol / Substance Use: Not Applicable Psych Involvement: No (comment)  Admission diagnosis:  Sepsis secondary to UTI ([place]) [A41.9, N39.0] Sepsis, due to unspecified organism, unspecified whether acute organ dysfunction present [facility]) [A41.9] Patient Active Problem List   Diagnosis Date Noted   Acute urinary retention 02/03/2022   Sepsis secondary to UTI ([place]) 02/05/2022   Dementia without behavioral disturbance ([place]) 74/94/4967   Acute metabolic encephalopathy 59/16/3846   Hypothyroidism 02/23/2022   AKI (acute kidney injury) ([place]) 02/22/2022   Nausea 05/31/2020   Memory disorder 05/01/2016   Loss of weight 07/23/2015   Graves' eye disease 04/16/2011   Routine general medical examination at a health care facility 01/27/2011   HYPOGONADISM 02/03/2010   Hyperlipidemia 05/30/2009   BPH (benign prostatic hyperplasia) 02/26/2009   ELECTROCARDIOGRAM, ABNORMAL 12/08/2006   PCP:  Hoyt Koch, MD Pharmacy:   [id number] [place] [address]  [place] [address] Phone: [phone] Fax: [phone]     Social Determinants of Health (SDOH) Interventions    Readmission Risk Interventions     No data to display

## 2022-02-03 ENCOUNTER — Inpatient Hospital Stay (HOSPITAL_COMMUNITY): Payer: Medicare PPO

## 2022-02-03 DIAGNOSIS — R4182 Altered mental status, unspecified: Secondary | ICD-10-CM | POA: Diagnosis not present

## 2022-02-03 DIAGNOSIS — N390 Urinary tract infection, site not specified: Secondary | ICD-10-CM | POA: Diagnosis not present

## 2022-02-03 DIAGNOSIS — Z7189 Other specified counseling: Secondary | ICD-10-CM

## 2022-02-03 DIAGNOSIS — F0390 Unspecified dementia without behavioral disturbance: Secondary | ICD-10-CM

## 2022-02-03 DIAGNOSIS — A419 Sepsis, unspecified organism: Secondary | ICD-10-CM | POA: Diagnosis not present

## 2022-02-03 LAB — BASIC METABOLIC PANEL
Anion gap: 12 (ref 5–15)
Anion gap: 13 (ref 5–15)
BUN: 34 mg/dL — ABNORMAL HIGH (ref 8–23)
BUN: 34 mg/dL — ABNORMAL HIGH (ref 8–23)
CO2: 22 mmol/L (ref 22–32)
CO2: 22 mmol/L (ref 22–32)
Calcium: 8.2 mg/dL — ABNORMAL LOW (ref 8.9–10.3)
Calcium: 8.3 mg/dL — ABNORMAL LOW (ref 8.9–10.3)
Chloride: 100 mmol/L (ref 98–111)
Chloride: 99 mmol/L (ref 98–111)
Creatinine, Ser: 1.38 mg/dL — ABNORMAL HIGH (ref 0.61–1.24)
Creatinine, Ser: 1.34 mg/dL — ABNORMAL HIGH (ref 0.61–1.24)
GFR, Estimated: 49 mL/min — ABNORMAL LOW (ref >60)
GFR, Estimated: 50 mL/min — ABNORMAL LOW (ref >60)
Glucose, Bld: 109 mg/dL — ABNORMAL HIGH (ref 70–99)
Glucose, Bld: 99 mg/dL (ref 70–99)
Potassium: 3.6 mmol/L (ref 3.5–5.1)
Potassium: 3.6 mmol/L (ref 3.5–5.1)
Sodium: 134 mmol/L — ABNORMAL LOW (ref 135–145)
Sodium: 134 mmol/L — ABNORMAL LOW (ref 135–145)

## 2022-02-03 LAB — CBC WITH DIFFERENTIAL/PLATELET
Abs Immature Granulocytes: 1.13 10*3/uL — ABNORMAL HIGH (ref 0.00–0.07)
Basophils Absolute: 0.1 10*3/uL (ref 0.0–0.1)
Basophils Relative: 0 %
Eosinophils Absolute: 0.4 10*3/uL (ref 0.0–0.5)
Eosinophils Relative: 2 %
HCT: 33.7 % — ABNORMAL LOW (ref 39.0–52.0)
Hemoglobin: 11.7 g/dL — ABNORMAL LOW (ref 13.0–17.0)
Immature Granulocytes: 5 %
Lymphocytes Relative: 6 %
Lymphs Abs: 1.3 10*3/uL (ref 0.7–4.0)
MCH: 30.4 pg (ref 26.0–34.0)
MCHC: 34.7 g/dL (ref 30.0–36.0)
MCV: 87.5 fL (ref 80.0–100.0)
Monocytes Absolute: 2.2 10*3/uL — ABNORMAL HIGH (ref 0.1–1.0)
Monocytes Relative: 10 %
Neutro Abs: 16.9 10*3/uL — ABNORMAL HIGH (ref 1.7–7.7)
Neutrophils Relative %: 77 %
Platelets: 438 10*3/uL — ABNORMAL HIGH (ref 150–400)
RBC: 3.85 MIL/uL — ABNORMAL LOW (ref 4.22–5.81)
RDW: 14.2 % (ref 11.5–15.5)
WBC: 22 10*3/uL — ABNORMAL HIGH (ref 4.0–10.5)
nRBC: 0 % (ref 0.0–0.2)

## 2022-02-03 LAB — MAGNESIUM: Magnesium: 2.2 mg/dL (ref 1.7–2.4)

## 2022-02-03 LAB — C-REACTIVE PROTEIN: CRP: 35.1 mg/dL — ABNORMAL HIGH (ref <1.0)

## 2022-02-03 LAB — BLOOD GAS, VENOUS
Acid-Base Excess: 4.5 mmol/L — ABNORMAL HIGH (ref 0.0–2.0)
Bicarbonate: 26.5 mmol/L (ref 20.0–28.0)
Drawn by: 66624
O2 Saturation: 89.6 %
Patient temperature: 37
pCO2, Ven: 31 mmHg — ABNORMAL LOW (ref 44–60)
pH, Ven: 7.54 — ABNORMAL HIGH (ref 7.25–7.43)
pO2, Ven: 54 mmHg — ABNORMAL HIGH (ref 32–45)

## 2022-02-03 LAB — PHOSPHORUS: Phosphorus: 2.6 mg/dL (ref 2.5–4.6)

## 2022-02-03 LAB — MRSA NEXT GEN BY PCR, NASAL: MRSA by PCR Next Gen: NOT DETECTED

## 2022-02-03 LAB — BRAIN NATRIURETIC PEPTIDE: B Natriuretic Peptide: 215.1 pg/mL — ABNORMAL HIGH (ref 0.0–100.0)

## 2022-02-03 LAB — PROCALCITONIN: Procalcitonin: 17.48 ng/mL

## 2022-02-03 MED ORDER — MIDODRINE HCL 5 MG PO TABS
10.0000 mg | ORAL_TABLET | Freq: Two times a day (BID) | ORAL | Status: DC
  Administered 2022-02-04: 10 mg via ORAL
  Filled 2022-02-03 (×2): qty 2

## 2022-02-03 MED ORDER — ENOXAPARIN SODIUM 40 MG/0.4ML IJ SOSY
40.0000 mg | PREFILLED_SYRINGE | Freq: Every day | INTRAMUSCULAR | Status: DC
  Administered 2022-02-03 – 2022-02-05 (×3): 40 mg via SUBCUTANEOUS
  Filled 2022-02-03 (×3): qty 0.4

## 2022-02-03 MED ORDER — SODIUM CHLORIDE 0.9 % IV SOLN
25.0000 mg | Freq: Once | INTRAVENOUS | Status: AC
  Administered 2022-02-03: 25 mg via INTRAVENOUS
  Filled 2022-02-03: qty 1

## 2022-02-03 MED ORDER — SODIUM CHLORIDE 0.9 % IV SOLN
3.0000 g | Freq: Four times a day (QID) | INTRAVENOUS | Status: DC
  Administered 2022-02-03 – 2022-02-05 (×9): 3 g via INTRAVENOUS
  Filled 2022-02-03 (×9): qty 8

## 2022-02-03 MED ORDER — POTASSIUM CL IN DEXTROSE 5% 20 MEQ/L IV SOLN
20.0000 meq | INTRAVENOUS | Status: DC
  Administered 2022-02-03: 20 meq via INTRAVENOUS
  Filled 2022-02-03 (×2): qty 1000

## 2022-02-03 MED ORDER — FUROSEMIDE 10 MG/ML IJ SOLN
20.0000 mg | Freq: Once | INTRAMUSCULAR | Status: AC
  Administered 2022-02-03: 20 mg via INTRAVENOUS
  Filled 2022-02-03: qty 2

## 2022-02-03 MED ORDER — DOCUSATE SODIUM 100 MG PO CAPS
200.0000 mg | ORAL_CAPSULE | Freq: Two times a day (BID) | ORAL | Status: DC
  Administered 2022-02-03 – 2022-02-06 (×3): 200 mg via ORAL
  Filled 2022-02-03 (×4): qty 2

## 2022-02-03 MED ORDER — LACTATED RINGERS IV BOLUS
500.0000 mL | Freq: Once | INTRAVENOUS | Status: AC
  Administered 2022-02-03: 500 mL via INTRAVENOUS

## 2022-02-03 MED ORDER — CHLORHEXIDINE GLUCONATE CLOTH 2 % EX PADS
6.0000 | MEDICATED_PAD | Freq: Every day | CUTANEOUS | Status: DC
  Administered 2022-02-03 – 2022-02-07 (×5): 6 via TOPICAL

## 2022-02-03 MED ORDER — POLYETHYLENE GLYCOL 3350 17 G PO PACK
17.0000 g | PACK | Freq: Two times a day (BID) | ORAL | Status: DC
  Administered 2022-02-03 – 2022-02-06 (×3): 17 g via ORAL
  Filled 2022-02-03 (×8): qty 1

## 2022-02-03 NOTE — Plan of Care (Signed)

## 2022-02-03 NOTE — Procedures (Signed)
Patient Name: Tommy Browning.  MRN: [id number]  Epilepsy Attending: Lora Havens  Referring Physician/Provider: Thurnell Lose, MD  Date: 02/03/2022 Duration: 23.17 mins  Patient history: 88yo M with ams. EEG to evaluate for seizure  Level of alertness: Awake, asleep  AEDs during EEG study: None  Technical aspects: This EEG study was done with scalp electrodes positioned according to the 10-20 International system of electrode placement. Electrical activity was reviewed with band pass filter of 1-'70Hz'$ , sensitivity of 7 uV/mm, display speed of 55m/sec with a '60Hz'$  notched filter applied as appropriate. EEG data were recorded continuously and digitally stored.  Video monitoring was available and reviewed as appropriate.  Description: The posterior dominant rhythm consists of 8 Hz activity of moderate voltage (25-35 uV) seen predominantly in posterior head regions, symmetric and reactive to eye opening and eye closing. Sleep was characterized by vertex waves, sleep spindles (12 to 14 Hz), maximal frontocentral region. EEG showed intermittent generalized 5 to 6 Hz theta slowing. Hyperventilation and photic stimulation were not performed.     ABNORMALITY - Intermittent slow, generalized  IMPRESSION: This study is suggestive of mild diffuse encephalopathy, nonspecific etiology. No seizures or epileptiform discharges were seen throughout the recording.  Aylla Huffine OBarbra Sarks

## 2022-02-03 NOTE — Progress Notes (Signed)
Daily Progress Note   Patient Name: Tommy Browning.       Date: 02/03/2022 DOB: 1929-05-13  Age: 88 y.o. MRN#: [id number] Attending Physician: Thurnell Lose, MD Primary Care Physician: Hoyt Koch, MD Admit Date: 02/23/2022 Establishing goals of care Reason for Consultation/Follow-up: Establishing goals of care  Patient Profile/HPI:  88 y.o. male  with past medical history of mild dementia, hypothyroid, BPH admitted on 02/09/2022 with sepsis due to UTI likely related to urinary retention from BPH.  He has a Foley in place.  He has had some transient shortness of breath with given dose of Lasix.  Chest x-ray was clear.  Echocardiogram is pending.  Palliative medicine consulted for 88 yr old, frail with some dementia, Uro sepsis, GOC.   Subjective: Met with spouse and daughter.  Patient is much sleepier today than yesterday. Noted he received thorazine this morning which could be contributing.  Family asking about what services Palliative provides at home. Attempted to redirect to [place] discussion.  Prior to admission per family- patient ambulating, able to leave the house and run errands. Had acute decline about a week prior to admission with decreased appetite and activity.  Family continues to want for aggressive medical care in hopes patient will regain at least some of his functional status.  Code status discussed. Encouraged patient/family to consider DNR/DNI status understanding evidenced based poor outcomes in similar hospitalized patients, as the cause of the arrest is likely associated with chronic/terminal disease rather than a reversible acute cardio-pulmonary event.  Reviewed hospice and palliative services in the community. Family was confused by discussion with TOC. Attempted  to call care manager while in room to clarify.   Review of Systems  Unable to perform ROS: Mental status change     Physical Exam Vitals and nursing note reviewed.  Constitutional:      Comments: lethargic  Cardiovascular:     Rate and Rhythm: Normal rate.  Skin:    General: Skin is warm and dry.             Vital Signs: BP 99/74 (BP Location: Right Arm)   Pulse (!) 118   Temp 99.1 F (37.3 C) (Oral)   Resp (!) 25   Ht _0  (1.753 m)   Wt 65.8 kg   SpO2 100%  BMI 21.41 kg/m  SpO2: SpO2: 100 % O2 Device: O2 Device: Room Air O2 Flow Rate: O2 Flow Rate (L/min): 2 L/min  Intake/output summary:  Intake/Output Summary (Last 24 hours) at 02/03/2022 1900 Last data filed at 02/03/2022 1000 Gross per 24 hour  Intake 100 ml  Output 700 ml  Net -600 ml   LBM: Last BM Date : 02/01/22 Baseline Weight: Weight: 68 kg Most recent weight: Weight: 65.8 kg       Palliative Assessment/Data: PPS: 20%      Patient Active Problem List   Diagnosis Date Noted   Acute urinary retention 01/28/2022   Sepsis secondary to UTI ([place]) 02/08/2022   Dementia without behavioral disturbance ([place]) 54/36/0677   Acute metabolic encephalopathy 03/40/3524   Hypothyroidism 02/21/2022   AKI (acute kidney injury) ([place]) 02/01/2022   Nausea 05/31/2020   Memory disorder 05/01/2016   Loss of weight 07/23/2015   Graves' eye disease 04/16/2011   Routine general medical examination at a health care facility 01/27/2011   HYPOGONADISM 02/03/2010   Hyperlipidemia 05/30/2009   BPH (benign prostatic hyperplasia) 02/26/2009   ELECTROCARDIOGRAM, ABNORMAL 12/08/2006    Palliative Care Assessment & Plan    Assessment/Recommendations/Plan  Continue full scope, full code Have reached out to [facility] in efforts to clarify what discussion was had Recommend stopping thorazine as this is likely contributing to his lethargy Plan to followup with family tomorrow regarding continued [place] discussion   Code  Status: Full code  Prognosis:  Unable to determine  Discharge Planning: To Be Determined  Care plan was discussed with family and care team  Thank you for allowing the Palliative Medicine Team to assist in the care of this patient.  Total time: 90 minutes  Greater than 50%  of this time was spent counseling and coordinating care related to the above assessment and plan.  Mariana Kaufman, AGNP-C Palliative Medicine   Please contact Palliative Medicine Team phone at [phone] for questions and concerns.

## 2022-02-03 NOTE — Care Management Important Message (Signed)
Important Message  Patient Details  Name: Tommy Browning. MRN: [id number] Date of Birth: Aug 27, 1929   Medicare Important Message Given:  Yes     Rolando Hessling 02/03/2022, 2:52 PM

## 2022-02-03 NOTE — Progress Notes (Signed)
Mobility Specialist Progress Note   02/03/22 1010  Mobility  Activity Contraindicated/medical hold   Patient received in supine asleep. Was very lethargic and could not stay awake even with verbal and physical stimuli. Will f/u as time permits. RN notified.   [place] Maris [place], [place], [place]  Office: [phone]

## 2022-02-03 NOTE — TOC Progression Note (Signed)
Transition of Care (TOC) - Progression Note    Patient Details  Name: Tommy Browning. MRN: [id number] Date of Birth: 04/26/1929  Transition of Care [facility]) CM/SW Contact  Carles Collet, RN Phone Number: 02/03/2022, 4:08 PM  Clinical Narrative:     Spent >60 minutes with patient and family with multiple visits at bedside today.  Discussed support needed for home.  Private Duty Care Wife and daughter understand that they will need 24 hour supervision and assistance at home. They are in the process of solidifying private duty care with Colletta Maryland from [facility], spoke w Colletta Maryland and confirmed that she is a personal care aid. Wife and family Lorita Officer that it their responsibility to set this up and that it is private pay.  Home Health Discussed that patient will also need Home Health services and reviewed that [facility] services are limited to hour long visits a few times a week. Reviewed Medicare ratings and left list with patient. They would [facility] and referral has been accepted. Will need home health orders with face to face for [facility] PT OT RN SW SLP HHA. DME Hospital bed and bedside commode ordered through [employer] to be delivered to the house tomorrow. Family is working on clearing space and with understanding that bed will be delivered at some time tomorrow. They are interested in tib bench. This is private pay. We reviewed types and they will order one with a sliding bench seat from [facility] after they get home.  Hospice Palliative Care Reviewed differences in levels of care and philosophies of treatment. Wife and family are agreeable to palliative services at this time. Palliative will follow for assistance with chronic disease management. [facility]ference was [facility] and if [facility] would not accept [facility]. Patient has neither an active cancer diagnosis nor current recs for hospice from palliative, and [facility] unable to accept. Referral accepted by Manus Gunning with [facility]. Discussed with Palliative NP  and family.  Transportation Patient will likely need PTAR due to weakness. TOC will continue to follow.    Expected Discharge Plan: [place] Barriers to Discharge: Continued Medical Work up  Expected Discharge Plan and Services Expected Discharge Plan: [place] In-house Referral: Clinical Social Work, Hospice / Palliative Care Discharge Planning Services: CM Consult Post Acute Care Choice: Home Health, Durable Medical Equipment Living arrangements for the past 2 months: Single Family Home                 DME Arranged: Bedside commode DME Agency: [employer] Date DME Agency Contacted: 02/03/22 Time DME Agency Contacted: [id number] Representative spoke with at DME Agency: Myrtha Mantis HH Arranged: RN, PT, OT, Nurse's Aide, Speech Therapy, Social Work [place] Agency: [place] Date [place]: 02/03/22 Time [place]: 1551 Representative spoke with at [place] for palliative services   Social Determinants of Health ([place]) Interventions    Readmission Risk Interventions     No data to display

## 2022-02-03 NOTE — Progress Notes (Signed)
EEG complete - results pending 

## 2022-02-03 NOTE — Progress Notes (Addendum)
PROGRESS NOTE                                                                                                                                                                                                             Patient Demographics:    Tommy Browning, is a 88 y.o. male, DOB - May 19, 1929, PJS:[id number]  Outpatient Primary MD for the patient is Hoyt Koch, MD    LOS - 3  Admit date - 02/22/2022    Chief Complaint  Patient presents with   Altered Mental Status       Brief Narrative (HPI from H&P)    88 y.o. male with medical history significant for BPH, hypothyroidism and dementia was brought in by EMS for evaluation of a one-week history of weakness and lethargy with decreased oral intake, and frequent urination that was 'very dark to red".  At baseline patient is ambulant unable to participate in ADLs however he has been staying in the bed and gets agitated when encouraged to eat or do anything.  The ER he was diagnosed with urinary retention with sepsis, UTI and toxic encephalopathy and admitted.   Subjective:   Patient in bed, appears comfortable, denies any headache, no fever, no chest pain or pressure, no shortness of breath , no abdominal pain. No focal weakness.   Assessment  & Plan :   Sepsis secondary to UTI caused by urinary retention.  Underlying history of BPH. Foley placed in the ER, on empiric antibiotics, sepsis pathophysiology is improving, add Flomax with outpatient urology follow-up.  Will be discharged with Foley catheter.  Possible aspiration pneumonia.  High white count, low-grade fevers with rising procalcitonin.  At risk for aspiration, might have early aspiration pneumonia although chest x-ray not revealing, switch antibiotics to Unasyn on 02/03/2022 diet to soft, elevate head of the bed, feeding assistance and aspiration precautions.  Speech therapy input.  Frequent hiccups.  Trial dose of  Thorazine, chest x-ray and KUB stable.  Check EEG as well.    Dehydration, hypernatremia and AKI.  Hydrated with IV fluids and monitor.  CT abdomen pelvis nonacute.   Acute metabolic encephalopathy in a patient with underlying dementia, extremely frail and deconditioned with advanced age - Secondary to sepsis.  He is pretty deconditioned and largely bedbound at baseline as well, treat sepsis, PT OT and advance  activity.  Involve palliative care for goals of care as well. DW Family, also niece Elmo Putt on 29/9/24 in the rm - phone, she agrees that he has been going downhill for mths, she as very concerned last yr and called the family to give him his 63th B'Day party as she thought he was not doing well then.  Hypothyroidism - Not currently on levothyroxine, stable TSH  Dementia without behavioral disturbance - Hold mirtazapine due to lethargy, delirium precautions, minimize narcotics and benzodiazepines.  Mild shortness of breath on 02/01/2022.  Was nonspecific, question of mild fluid overload and nonspecific CHF, no echo on file, obtain echocardiogram, fluids reduced, symptoms resolved chest x-ray stable continue to monitor.       Condition -  Guarded  Family Communication  : Daughter and wife in detail on 02/01/2022, wife at bedside on 02/02/2022, daughter and wife bedside 02/03/2022 told them that he is likely gradually declining due to his age and underlying dementia.  This might be permanent and gradual terminal decline.  Code Status :  Full  Consults  : Palliative care for goals of care  PUD Prophylaxis :    Procedures  :     TTE  CT head.  Non acute  CT abdomen pelvis -  Unremarkable CT abdomen/pelvis.      Disposition Plan  :    Status is: Inpatient  DVT Prophylaxis  :    enoxaparin (LOVENOX) injection 30 mg Start: 02/01/22 1000    Lab Results  Component Value Date   PLT 438 (H) 02/03/2022    Diet :  Diet Order             Diet regular Room service appropriate?  Yes; Fluid consistency: Thin  Diet effective now                    Inpatient Medications  Scheduled Meds:  aspirin EC  81 mg Oral Daily   Chlorhexidine Gluconate Cloth  6 each Topical Daily   cyanocobalamin  1,000 mcg Oral Daily   enoxaparin (LOVENOX) injection  30 mg Subcutaneous Daily   latanoprost  1 drop Both Eyes QHS   metoprolol tartrate  25 mg Oral BID   midodrine  5 mg Oral BID WC   mirtazapine  7.5 mg Oral QHS   omega-3 acid ethyl esters  1 g Oral Daily   tamsulosin  0.4 mg Oral Daily   Continuous Infusions:  cefTRIAXone (ROCEPHIN)  IV Stopped (02/03/22 0654)   chlorproMAZINE (THORAZINE) 25 mg in sodium chloride 0.9 % 25 mL IVPB     PRN Meds:.acetaminophen **OR** acetaminophen, HYDROcodone-acetaminophen, ondansetron **OR** ondansetron (ZOFRAN) IV  Antibiotics  :    Anti-infectives (From admission, onward)    Start     Dose/Rate Route Frequency Ordered Stop   02/01/22 2200  cefTRIAXone (ROCEPHIN) 2 g in sodium chloride 0.9 % 100 mL IVPB        2 g 200 mL/hr over 30 Minutes Intravenous Every 24 hours 02/08/2022 2322 02/08/22 2159   02/02/2022 2330  vancomycin (VANCOREADY) IVPB 1250 mg/250 mL        1,250 mg 166.7 mL/hr over 90 Minutes Intravenous  Once 02/13/2022 2229 02/01/22 0518   02/18/2022 2230  ceFEPIme (MAXIPIME) 2 g in sodium chloride 0.9 % 100 mL IVPB        2 g 200 mL/hr over 30 Minutes Intravenous  Once 02/05/2022 2229 02/19/2022 2348         Objective:   Vitals:  02/03/22 0100 02/03/22 0200 02/03/22 0300 02/03/22 0400  BP: 101/73 105/72 111/74 108/79  Pulse: (!) 105 (!) 102 (!) 106 (!) 107  Resp: (!) 22 20 (!) 22 17  Temp:    99.3 F (37.4 C)  TempSrc:    Axillary  SpO2: 98%  98% 98%  Weight:      Height:        Wt Readings from Last 3 Encounters:  02/01/22 65.8 kg  01/27/22 65.8 kg  09/24/21 69.3 kg     Intake/Output Summary (Last 24 hours) at 02/03/2022 0834 Last data filed at 02/03/2022 0654 Gross per 24 hour  Intake 374.73 ml   Output 1250 ml  Net -875.27 ml     Physical Exam  BP but easily arousable, oriented x1, no focal deficits, having frequent hiccups, [place].AT,PERRAL Supple Neck, No JVD,   Symmetrical Chest wall movement, Good air movement bilaterally, CTAB RRR,No Gallops, Rubs or new Murmurs,  +ve B.Sounds, Abd Soft, No tenderness,   No Cyanosis, Clubbing or edema       Data Review:    CBC Recent Labs  Lab 02/26/2022 2123 02/01/22 0230 02/02/22 0447 02/03/22 0442  WBC 19.2* 20.6* 19.8* 22.0*  HGB 14.7 13.2 12.3* 11.7*  HCT 43.2 41.6 35.8* 33.7*  PLT 461* 489* 424* 438*  MCV 89.6 92.4 88.0 87.5  MCH 30.5 29.3 30.2 30.4  MCHC 34.0 31.7 34.4 34.7  RDW 13.8 13.9 13.9 14.2  LYMPHSABS 0.9  --  1.1 1.3  MONOABS 2.0*  --  2.1* 2.2*  EOSABS 0.1  --  0.4 0.4  BASOSABS 0.0  --  0.1 0.1    Electrolytes Recent Labs  Lab 02/13/2022 2123 02/01/22 0230 02/02/22 0447 02/03/22 0135 02/03/22 0442 02/03/22 0703  NA 147* 147* 141 134* 134*  --   K 3.8 4.1 3.3* 3.6 3.6  --   CL 106 109 105 100 99  --   CO2 '26 27 24 22 22  '$ --   GLUCOSE 132* 116* 100* 109* 99  --   BUN 49* 49* 38* 34* 34*  --   CREATININE 2.06* 1.83* 1.47* 1.38* 1.34*  --   CALCIUM 9.7 9.3 8.6* 8.2* 8.3*  --   MG  --   --  2.2  --  2.2  --   PROCALCITON  --   --   --   --   --  17.48  TSH  --  1.216  --   --   --   --   BNP  --   --  195.6*  --  215.1*  --       Radiology Reports DG Chest Port 1 View  Result Date: 02/03/2022 CLINICAL DATA:  88 year old male with altered mental status, nausea, leukocytosis. EXAM: PORTABLE CHEST 1 VIEW COMPARISON:  Recent CT Abdomen and Pelvis 02/01/2022. Portable chest x-ray yesterday. FINDINGS: Portable AP semi upright view at 0719 hours. Stable low lung volumes. Stable mediastinal contours, no cardiomegaly. Allowing for portable technique the lungs are clear. Visualized tracheal air column is within normal limits. Stable visualized osseous structures. IMPRESSION: Stable low lung volumes. No  acute cardiopulmonary abnormality. Electronically Signed   By: Genevie Ann M.D.   On: 02/03/2022 07:35   DG Abd Portable 1V  Result Date: 02/03/2022 CLINICAL DATA:  88 year old male with altered mental status, nausea, leukocytosis. EXAM: PORTABLE ABDOMEN - 1 VIEW COMPARISON:  Recent CT Abdomen and Pelvis 02/01/2022. FINDINGS: Portable AP view at 0716 hours. Bowel gas pattern is  similar and remains non obstructed. Visible abdominal and pelvic visceral contours appear stable. Pelvic phleboliths. Stable visualized osseous structures. IMPRESSION: Bowel-gas pattern remains non obstructed, no acute radiographic finding. Electronically Signed   By: Genevie Ann M.D.   On: 02/03/2022 07:34   ECHOCARDIOGRAM COMPLETE  Result Date: 02/02/2022    ECHOCARDIOGRAM REPORT   Patient Name:   Tommy Browning. Date of Exam: 02/02/2022 Medical Rec #:  [id number]       Height:       69.0 in Accession #:    [id number]      Weight:       145.0 lb Date of Birth:  October 21, 1929       BSA:          1.802 m Patient Age:    38 years        BP:           95/66 mmHg Patient Gender: M               HR:           108 bpm. Exam Location:  Inpatient Procedure: 2D Echo, Cardiac Doppler, Color Doppler and Intracardiac            Opacification Agent Indications:    CHF- Acute Diastolic B35.32  History:        Patient has no prior history of Echocardiogram examinations.                 Abnormal ECG; Risk Factors:Dyslipidemia.  Sonographer:    Ronny Flurry Referring Phys: Graylin Shiver [facility]  Sonographer Comments: No parasternal window, suboptimal apical window and Technically difficult study due to poor echo windows. Image acquisition challenging due to patient body habitus and Image acquisition challenging due to respiratory motion. IMPRESSIONS  1. Left ventricular ejection fraction, by estimation, is 70 to 75%. The left ventricle has hyperdynamic function. The left ventricle has no regional wall motion abnormalities. Left ventricular diastolic  parameters are indeterminate.  2. Right ventricular systolic function is normal. The right ventricular size is normal. Tricuspid regurgitation signal is inadequate for assessing PA pressure.  3. The mitral valve is normal in structure. No evidence of mitral valve regurgitation. No evidence of mitral stenosis.  4. The aortic valve was not well visualized. Aortic valve regurgitation is not visualized. No aortic stenosis is present.  5. The inferior vena cava is normal in size with <50% respiratory variability, suggesting right atrial pressure of 8 mmHg. FINDINGS  Left Ventricle: Left ventricular ejection fraction, by estimation, is 70 to 75%. The left ventricle has hyperdynamic function. The left ventricle has no regional wall motion abnormalities. Definity contrast agent was given IV to delineate the left ventricular endocardial borders. The left ventricular internal cavity size was small. There is no left ventricular hypertrophy. Left ventricular diastolic parameters are indeterminate. Right Ventricle: The right ventricular size is normal. No increase in right ventricular wall thickness. Right ventricular systolic function is normal. Tricuspid regurgitation signal is inadequate for assessing PA pressure. Left Atrium: Left atrial size was normal in size. Right Atrium: Right atrial size was normal in size. Pericardium: Trivial pericardial effusion is present. Mitral Valve: The mitral valve is normal in structure. No evidence of mitral valve regurgitation. No evidence of mitral valve stenosis. Tricuspid Valve: The tricuspid valve is normal in structure. Tricuspid valve regurgitation is trivial. Aortic Valve: The aortic valve was not well visualized. Aortic valve regurgitation is not visualized. No aortic stenosis is present. Aortic valve  mean gradient measures 6.0 mmHg. Aortic valve peak gradient measures 10.5 mmHg. Pulmonic Valve: The pulmonic valve was not well visualized. Pulmonic valve regurgitation is not  visualized. Aorta: The aortic root was not well visualized. Venous: The inferior vena cava is normal in size with less than 50% respiratory variability, suggesting right atrial pressure of 8 mmHg. IAS/Shunts: The interatrial septum was not well visualized.   LV Volumes (MOD) LV vol d, MOD A2C: 48.5 ml Diastology LV vol d, MOD A4C: 82.9 ml LV e' medial:    6.20 cm/s LV vol s, MOD A2C: 13.4 ml LV E/e' medial:  11.8 LV vol s, MOD A4C: 16.6 ml LV e' lateral:   7.62 cm/s LV SV MOD A2C:     35.1 ml LV E/e' lateral: 9.6 LV SV MOD A4C:     82.9 ml LV SV MOD BP:      49.4 ml RIGHT VENTRICLE            IVC RV Basal diam:  2.30 cm    IVC diam: 1.60 cm RV S prime:     9.95 cm/s TAPSE (M-mode): 1.2 cm LEFT ATRIUM           Index        RIGHT ATRIUM          Index LA Vol (A2C): 21.0 ml 11.65 ml/m  RA Area:     9.94 cm LA Vol (A4C): 15.4 ml 8.55 ml/m   RA Volume:   17.90 ml 9.93 ml/m  AORTIC VALVE AV Vmax:           162.00 cm/s AV Vmean:          111.000 cm/s AV VTI:            0.240 m AV Peak Grad:      10.5 mmHg AV Mean Grad:      6.0 mmHg LVOT Vmax:         135.67 cm/s LVOT Vmean:        91.633 cm/s LVOT VTI:          0.195 m LVOT/AV VTI ratio: 0.81 MITRAL VALVE MV Area (PHT): 4.39 cm    SHUNTS MV Decel Time: 173 msec    Systemic VTI: 0.19 m MR Peak grad: 8.3 mmHg MR Vmax:      144.00 cm/s MV E velocity: 73.20 cm/s MV A velocity: 86.40 cm/s MV E/A ratio:  0.85 Oswaldo Milian MD Electronically signed by Oswaldo Milian MD Signature Date/Time: 02/02/2022/3:07:23 PM    Final    DG Chest Port 1 View  Result Date: 02/02/2022 CLINICAL DATA:  88 year old male with history of shortness of breath. EXAM: PORTABLE CHEST 1 VIEW COMPARISON:  Chest x-ray 02/01/2022. FINDINGS: Lung volumes are normal. No consolidative airspace disease. No pleural effusions. No pneumothorax. No pulmonary nodule or mass noted. Pulmonary vasculature and the cardiomediastinal silhouette are within normal limits. IMPRESSION: No radiographic  evidence of acute cardiopulmonary disease. Electronically Signed   By: Vinnie Langton M.D.   On: 02/02/2022 07:09   DG Chest Port 1 View  Result Date: 02/01/2022 CLINICAL DATA:  Shortness of breath EXAM: PORTABLE CHEST 1 VIEW COMPARISON:  02/16/2022 FINDINGS: Lungs are clear.  No pleural effusion or pneumothorax. The heart is normal in size. IMPRESSION: No evidence of acute cardiopulmonary disease. Electronically Signed   By: Julian Hy M.D.   On: 02/01/2022 20:03   CT ABDOMEN PELVIS WO CONTRAST  Result Date: 02/01/2022 CLINICAL DATA:  Kidney failure weakness, decreased  appetite, dementia EXAM: CT ABDOMEN AND PELVIS WITHOUT CONTRAST TECHNIQUE: Multidetector CT imaging of the abdomen and pelvis was performed following the standard protocol without IV contrast. RADIATION DOSE REDUCTION: This exam was performed according to the departmental dose-optimization program which includes automated exposure control, adjustment of the mA and/or kV according to patient size and/or use of iterative reconstruction technique. COMPARISON:  CT chest abdomen pelvis dated 04/04/2021. FINDINGS: Lower chest: Mild right basilar atelectasis. Hepatobiliary: Unenhanced liver is unremarkable. Gallbladder is unremarkable. No intrahepatic or extrahepatic duct dilatation. Pancreas: Within normal limits. Spleen: Within normal limits. Adrenals/Urinary Tract: Adrenal glands are within normal limits. 3.7 cm left lower pole renal cyst (series 3/image 39), measuring simple fluid density, benign (Bosniak I). No follow-up is recommended. Kidneys are otherwise within normal limits.  No hydronephrosis. Bladder is decompressed by an indwelling Foley catheter. Stomach/Bowel: Stomach is within normal limits. No evidence of bowel obstruction. Normal appendix (series 3/image 62). Scattered mild colonic diverticulosis, without evidence of diverticulitis. Vascular/Lymphatic: No evidence of abdominal aortic aneurysm. Atherosclerotic  calcifications of the abdominal aorta and branch vessels. No suspicious abdominopelvic lymphadenopathy. Reproductive: Prostate is grossly unremarkable. Other: No abdominopelvic ascites. Musculoskeletal: Mild degenerative changes of the lower thoracic spine. IMPRESSION: Unremarkable CT abdomen/pelvis. Electronically Signed   By: Julian Hy M.D.   On: 02/01/2022 01:44   CT HEAD WO CONTRAST (5MM)  Result Date: 02/10/2022 CLINICAL DATA:  Mental status change, unknown cause EXAM: CT HEAD WITHOUT CONTRAST TECHNIQUE: Contiguous axial images were obtained from the base of the skull through the vertex without intravenous contrast. RADIATION DOSE REDUCTION: This exam was performed according to the departmental dose-optimization program which includes automated exposure control, adjustment of the mA and/or kV according to patient size and/or use of iterative reconstruction technique. COMPARISON:  None Available. FINDINGS: Brain: Generalized atrophy is normal for age. There is moderate to advanced periventricular and deep white matter hypodensity typical of chronic small vessel ischemia. Remote lacunar infarcts in the right basal ganglia. No evidence of acute ischemia. No hemorrhage. No subdural or extra-axial collection. No hydrocephalus, midline shift or mass effect. Vascular: Atherosclerosis of skullbase vasculature without hyperdense vessel or abnormal calcification. Skull: No fracture or focal lesion. Sinuses/Orbits: No acute findings.  Bilateral intersection. Other: None. IMPRESSION: 1. No acute intracranial abnormality. 2. Generalized atrophy and chronic small vessel ischemia. Remote lacunar infarcts in the right basal ganglia. Electronically Signed   By: Keith Rake M.D.   On: 02/17/2022 21:54   DG Chest Portable 1 View  Result Date: 02/24/2022 CLINICAL DATA:  Altered mental status EXAM: PORTABLE CHEST 1 VIEW COMPARISON:  Radiographs 04/05/2017 FINDINGS: No focal consolidation, pleural effusion, or  pneumothorax. Normal cardiomediastinal silhouette. No acute osseous abnormality. IMPRESSION: No active disease. Electronically Signed   By: Placido Sou M.D.   On: 02/07/2022 21:49      Signature  Lala Lund M.D on 02/03/2022 at 8:34 AM   -  To page go to [email or web address]

## 2022-02-03 NOTE — Progress Notes (Signed)
Pharmacy Antibiotic Note  Tommy Browning. is a 88 y.o. male admitted on 02/04/2022 with altered mental status and was initially started on ceftriaxone for sepsis 2/2 possible UTI. Sepsis improved, but new concern for aspiration event due to rising WBC and procalcitonin. Pharmacy has been consulted for Unasyn dosing.  WBC up to 22, Procal 17, CRP 35. Tm 99. Possible early aspiration although CXR unrevealing. SLP to evaluate.   Plan: Discontinue ceftriaxone Begin Unasyn 3g IV q8 hours  Height: '5\' 9"'$  (175.3 cm) Weight: 65.8 kg (145 lb) IBW/kg (Calculated) : 70.7  Temp (24hrs), Avg:99 F (37.2 C), Min:98.2 F (36.8 C), Max:99.4 F (37.4 C)  Recent Labs  Lab 02/16/2022 2123 02/01/22 0230 02/02/22 0447 02/03/22 0135 02/03/22 0442  WBC 19.2* 20.6* 19.8*  --  22.0*  CREATININE 2.06* 1.83* 1.47* 1.38* 1.34*    Estimated Creatinine Clearance: 34.1 mL/min (A) (by C-G formula based on SCr of 1.34 mg/dL (H)).    No Known Allergies  Antimicrobials this admission: Cefepime 11/4 x1 Ceftriaxone 11/5 >> 11/6 Unasyn 11/7 >>   Dose adjustments this admission:  Microbiology results: 11/4 BCx: ngtd  Thank you for allowing pharmacy to be a part of this patient's care.  Dimple Nanas, PharmD, BCPS 02/03/2022 8:38 AM

## 2022-02-03 NOTE — Evaluation (Signed)
Clinical/Bedside Swallow Evaluation Patient Details  Name: Jaydien Panepinto. MRN: [id number] Date of Birth: [birth year]  Today's Date: 02/03/2022 Time: SLP Start Time (ACUTE ONLY): 1421 SLP Stop Time (ACUTE ONLY): 1431 SLP Time Calculation (min) (ACUTE ONLY): 10 min  Past Medical History:  Past Medical History:  Diagnosis Date   BACK PAIN 02/24/2008   Diplopia    Diverticulosis    ELECTROCARDIOGRAM, ABNORMAL 12/08/2006   F/U by a normal stress test on September 2005   ERECTILE DYSFUNCTION, ORGANIC 05/30/2009   History of thyroid nodule    Resolved by [place] 11/2006   HYPERLIPIDEMIA, MILD 05/30/2009   HYPERTHYROIDISM 12/08/2006   [place]   HYPERTROPHY PROSTATE W/UR OBST & OTH LUTS 02/26/2009   HYPOGONADISM 02/03/2010   Ischemic colitis ([place])    Tubular adenoma of colon 2004   Past Surgical History:  Past Surgical History:  Procedure Laterality Date   COLONOSCOPY     FLEXIBLE SIGMOIDOSCOPY N/A 03/08/2015   Procedure: FLEXIBLE SIGMOIDOSCOPY;  Surgeon: Ladene Artist, MD;  Location: [facility] ENDOSCOPY;  Service: Endoscopy;  Laterality: N/A;   HPI:  88 y.o. male presented from home to ED with sepsis secondary to UTI, AKI, acute metabolic encephalopathy. PMHx of dementia, hypothyroidism, BPH.    Assessment / Plan / Recommendation  Clinical Impression  SLP was consulted for a repeat swallow assessment given concerns for aspiration. He was lethargic this afternoon - presentation was much different than when evaluated in the ED on 11/5.  He could alert for brief moments, kept his eyes closed, drank several sips of water which led to consistent and weak coughing over three trials. His wife, daughter, and niece were at bedside. Staff was preparing for EEG so exam was limited. However, given his current mental status, he is unlikely to be able to eat safely. Recommend NPO for now; allow ice chips/small sips of water when alert; provide oral care.  SLP will follow for PO readiness. D/W family. SLP Visit  Diagnosis: Dysphagia, unspecified (R13.10)    Aspiration Risk    tba   Diet Recommendation   NPO, allow sips/chips and critical meds crushed in puree  Medication Administration: Crushed with puree    Other  Recommendations Oral Care Recommendations: Oral care QID    Recommendations for follow up therapy are one component of a multi-disciplinary discharge planning process, led by the attending physician.  Recommendations may be updated based on patient status, additional functional criteria and insurance authorization.  Follow up Recommendations Other (comment) (tba)      Assistance Recommended at Discharge Frequent or constant Supervision/Assistance  Functional Status Assessment Patient has had a recent decline in their functional status and demonstrates the ability to make significant improvements in function in a reasonable and predictable amount of time.  Frequency and Duration min 2x/week  2 weeks       Prognosis Prognosis for Safe Diet Advancement: Fair Barriers to Reach Goals: Cognitive deficits      Swallow Study   General HPI: 88 y.o. male presented from home to ED with sepsis secondary to UTI, AKI, acute metabolic encephalopathy. PMHx of dementia, hypothyroidism, BPH. Type of Study: Bedside Swallow Evaluation Previous Swallow Assessment: 11/5 Diet Prior to this Study: Regular;Thin liquids Temperature Spikes Noted: No Respiratory Status: Room air History of Recent Intubation: No Behavior/Cognition: Lethargic/Drowsy Oral Cavity Assessment: Within Functional Limits Oral Care Completed by SLP: No Oral Cavity - Dentition: Adequate natural dentition Self-Feeding Abilities: Total assist Patient Positioning: Upright in bed Baseline Vocal Quality: Normal Volitional Cough:  Cognitively unable to elicit Volitional Swallow: Unable to elicit    Oral/Motor/Sensory Function Overall Oral Motor/Sensory Function: Within functional limits   Ice Chips Ice chips: Not tested   Thin  Liquid Thin Liquid: Impaired Presentation: Straw Pharyngeal  Phase Impairments: Cough - Immediate    Nectar Thick Nectar Thick Liquid: Not tested   Honey Thick Honey Thick Liquid: Not tested   Puree Puree: Not tested   Solid     Solid: Not tested      Juan Quam Laurice 02/03/2022,4:09 PM  Estill Bamberg L. Tivis Ringer, MA CCC/SLP Clinical Specialist - [place] Office number [phone]

## 2022-02-03 NOTE — Care Management (Signed)
    Durable Medical Equipment  (From admission, onward)           Start     Ordered   02/03/22 1606  For home use only DME Bedside commode  Once       Question:  Patient needs a bedside commode to treat with the following condition  Answer:  Weakness   02/03/22 1606   02/02/22 1652  For home use only DME Hospital bed  Once       Question Answer Comment  Length of Need Lifetime   Patient has (list medical condition): dementia, sepsis   The above medical condition requires: Patient requires the ability to reposition frequently   Bed type Semi-electric   Support Surface: Gel Overlay      02/02/22 1652

## 2022-02-04 ENCOUNTER — Inpatient Hospital Stay (HOSPITAL_COMMUNITY): Payer: Medicare PPO

## 2022-02-04 DIAGNOSIS — Z515 Encounter for palliative care: Secondary | ICD-10-CM

## 2022-02-04 DIAGNOSIS — F03A0 Unspecified dementia, mild, without behavioral disturbance, psychotic disturbance, mood disturbance, and anxiety: Secondary | ICD-10-CM

## 2022-02-04 DIAGNOSIS — N390 Urinary tract infection, site not specified: Secondary | ICD-10-CM | POA: Diagnosis not present

## 2022-02-04 DIAGNOSIS — A419 Sepsis, unspecified organism: Secondary | ICD-10-CM | POA: Diagnosis not present

## 2022-02-04 DIAGNOSIS — Z7189 Other specified counseling: Secondary | ICD-10-CM | POA: Diagnosis not present

## 2022-02-04 LAB — CBC WITH DIFFERENTIAL/PLATELET
Abs Immature Granulocytes: 1.2 10*3/uL — ABNORMAL HIGH (ref 0.00–0.07)
Basophils Absolute: 0.1 10*3/uL (ref 0.0–0.1)
Basophils Relative: 0 %
Eosinophils Absolute: 0.5 10*3/uL (ref 0.0–0.5)
Eosinophils Relative: 2 %
HCT: 32.9 % — ABNORMAL LOW (ref 39.0–52.0)
Hemoglobin: 11.8 g/dL — ABNORMAL LOW (ref 13.0–17.0)
Immature Granulocytes: 5 %
Lymphocytes Relative: 4 %
Lymphs Abs: 1 10*3/uL (ref 0.7–4.0)
MCH: 30.6 pg (ref 26.0–34.0)
MCHC: 35.9 g/dL (ref 30.0–36.0)
MCV: 85.5 fL (ref 80.0–100.0)
Monocytes Absolute: 2.1 10*3/uL — ABNORMAL HIGH (ref 0.1–1.0)
Monocytes Relative: 8 %
Neutro Abs: 20.4 10*3/uL — ABNORMAL HIGH (ref 1.7–7.7)
Neutrophils Relative %: 81 %
Platelets: 468 10*3/uL — ABNORMAL HIGH (ref 150–400)
RBC: 3.85 MIL/uL — ABNORMAL LOW (ref 4.22–5.81)
RDW: 14 % (ref 11.5–15.5)
WBC: 25.3 10*3/uL — ABNORMAL HIGH (ref 4.0–10.5)
nRBC: 0 % (ref 0.0–0.2)

## 2022-02-04 LAB — BASIC METABOLIC PANEL
Anion gap: 10 (ref 5–15)
BUN: 33 mg/dL — ABNORMAL HIGH (ref 8–23)
CO2: 24 mmol/L (ref 22–32)
Calcium: 8.2 mg/dL — ABNORMAL LOW (ref 8.9–10.3)
Chloride: 100 mmol/L (ref 98–111)
Creatinine, Ser: 1.36 mg/dL — ABNORMAL HIGH (ref 0.61–1.24)
GFR, Estimated: 49 mL/min — ABNORMAL LOW (ref >60)
Glucose, Bld: 132 mg/dL — ABNORMAL HIGH (ref 70–99)
Potassium: 3.7 mmol/L (ref 3.5–5.1)
Sodium: 134 mmol/L — ABNORMAL LOW (ref 135–145)

## 2022-02-04 LAB — C-REACTIVE PROTEIN: CRP: 41.5 mg/dL — ABNORMAL HIGH (ref <1.0)

## 2022-02-04 LAB — PROCALCITONIN: Procalcitonin: 4.7 ng/mL

## 2022-02-04 LAB — MAGNESIUM: Magnesium: 2.2 mg/dL (ref 1.7–2.4)

## 2022-02-04 LAB — BRAIN NATRIURETIC PEPTIDE: B Natriuretic Peptide: 253.6 pg/mL — ABNORMAL HIGH (ref 0.0–100.0)

## 2022-02-04 MED ORDER — SODIUM CHLORIDE 0.9 % IV SOLN
12.5000 mg | Freq: Three times a day (TID) | INTRAVENOUS | Status: DC | PRN
  Filled 2022-02-04: qty 0.5

## 2022-02-04 MED ORDER — DICLOFENAC SODIUM 1 % EX GEL
2.0000 g | Freq: Four times a day (QID) | CUTANEOUS | Status: DC
  Administered 2022-02-04 – 2022-02-07 (×14): 2 g via TOPICAL
  Filled 2022-02-04: qty 100

## 2022-02-04 MED ORDER — POTASSIUM CHLORIDE 10 MEQ/100ML IV SOLN
10.0000 meq | INTRAVENOUS | Status: AC
  Administered 2022-02-04 (×2): 10 meq via INTRAVENOUS
  Filled 2022-02-04: qty 100

## 2022-02-04 MED ORDER — SODIUM CHLORIDE 0.9 % IV SOLN
12.5000 mg | Freq: Once | INTRAVENOUS | Status: AC
  Administered 2022-02-04: 12.5 mg via INTRAVENOUS
  Filled 2022-02-04: qty 0.5

## 2022-02-04 MED ORDER — FUROSEMIDE 10 MG/ML IJ SOLN
20.0000 mg | Freq: Once | INTRAMUSCULAR | Status: AC
  Administered 2022-02-04: 20 mg via INTRAVENOUS
  Filled 2022-02-04: qty 2

## 2022-02-04 MED ORDER — SCOPOLAMINE 1 MG/3DAYS TD PT72
1.0000 | MEDICATED_PATCH | TRANSDERMAL | Status: DC
  Administered 2022-02-04 – 2022-02-10 (×3): 1.5 mg via TRANSDERMAL
  Filled 2022-02-04 (×3): qty 1

## 2022-02-04 MED ORDER — FUROSEMIDE 10 MG/ML IJ SOLN
40.0000 mg | Freq: Once | INTRAMUSCULAR | Status: AC
  Administered 2022-02-04: 40 mg via INTRAVENOUS
  Filled 2022-02-04: qty 4

## 2022-02-04 MED ORDER — POTASSIUM CL IN DEXTROSE 5% 20 MEQ/L IV SOLN
20.0000 meq | INTRAVENOUS | Status: DC
  Administered 2022-02-04: 20 meq via INTRAVENOUS
  Filled 2022-02-04: qty 1000

## 2022-02-04 MED ORDER — IPRATROPIUM-ALBUTEROL 0.5-2.5 (3) MG/3ML IN SOLN
3.0000 mL | Freq: Four times a day (QID) | RESPIRATORY_TRACT | Status: DC | PRN
  Administered 2022-02-04 – 2022-02-07 (×2): 3 mL via RESPIRATORY_TRACT
  Filled 2022-02-04 (×2): qty 3

## 2022-02-04 NOTE — Progress Notes (Signed)
Physical Therapy Treatment Patient Details Name: Tommy Browning. MRN: [id number] DOB: [birth year] Today's Date: 02/04/2022   History of Present Illness Pt is a 88 yo male admitted with weakness, lethargy, not eating and dark urine. Pt founc to have UTI and toxic encephalopathy as well as sepsis. PMH: BPH, hypothyroid, dementia.    PT Comments    Pt complaining of neck and LE pain with mobility, pt's family states pt has been complaining of neck pain throughout the day suspect due to positioning. Pt overall requiring moderate assist for transfer-level mobility, pt fearful of falling this date. Pt's wife and daughter present during session and very supportive. PT to conitnue to follow.     Recommendations for follow up therapy are one component of a multi-disciplinary discharge planning process, led by the attending physician.  Recommendations may be updated based on patient status, additional functional criteria and insurance authorization.  Follow Up Recommendations  Skilled nursing-short term rehab (<3 hours/day) (family requesting home with HHPT) Can patient physically be transported by private vehicle: Yes   Assistance Recommended at Discharge Frequent or constant Supervision/Assistance  Patient can return home with the following A lot of help with walking and/or transfers;A lot of help with bathing/dressing/bathroom;Assist for transportation;Help with stairs or ramp for entrance;Direct supervision/assist for medications management   Equipment Recommendations  Rolling walker (2 wheels)    Recommendations for Other Services       Precautions / Restrictions Precautions Precautions: Fall Restrictions Weight Bearing Restrictions: No     Mobility  Bed Mobility Overal bed mobility: Needs Assistance Bed Mobility: Supine to Sit       Sit to supine: Mod assist   General bed mobility comments: assist for trunk and LE management, increased time and cues for sequencing throughout     Transfers Overall transfer level: Needs assistance Equipment used: 1 person hand held assist Transfers: Sit to/from Stand, Bed to chair/wheelchair/BSC Sit to Stand: Mod assist Stand pivot transfers: Mod assist         General transfer comment: assist for power up, rise, steadying, and safe pivot to bed.    Ambulation/Gait               General Gait Details: unable this date   Stairs             Wheelchair Mobility    Modified Rankin (Stroke Patients Only)       Balance Overall balance assessment: Needs assistance Sitting-balance support: Feet supported Sitting balance-Leahy Scale: Fair     Standing balance support: During functional activity, Bilateral upper extremity supported Standing balance-Leahy Scale: Poor                              Cognition Arousal/Alertness: Lethargic Behavior During Therapy: Flat affect Overall Cognitive Status: History of cognitive impairments - at baseline Area of Impairment: Attention, Memory, Safety/judgement, Awareness, Problem solving                   Current Attention Level: Sustained Memory: Decreased short-term memory   Safety/Judgement: Decreased awareness of safety, Decreased awareness of deficits Awareness: Intellectual Problem Solving: Slow processing, Decreased initiation, Requires verbal cues, Difficulty sequencing, Requires tactile cues          Exercises      General Comments General comments (skin integrity, edema, etc.): HRmax observed 125 bpm      Pertinent Vitals/Pain Pain Assessment Pain Assessment: Faces Faces Pain Scale: Hurts a  little bit Pain Location: generalized during mobility Pain Descriptors / Indicators: Discomfort, Sore Pain Intervention(s): Limited activity within patient's tolerance, Monitored during session, Repositioned    Home Living                          Prior Function            PT Goals (current goals can now be found in  the care plan section) Acute Rehab PT Goals Patient Stated Goal: not stated PT Goal Formulation: With patient/family Time For Goal Achievement: 02/16/22 Potential to Achieve Goals: Good Progress towards PT goals: Progressing toward goals    Frequency    Min 3X/week      PT Plan Current plan remains appropriate    Co-evaluation              AM-PAC PT "6 Clicks" Mobility   Outcome Measure  Help needed turning from your back to your side while in a flat bed without using bedrails?: A Little Help needed moving from lying on your back to sitting on the side of a flat bed without using bedrails?: A Lot Help needed moving to and from a bed to a chair (including a wheelchair)?: A Lot Help needed standing up from a chair using your arms (e.g., wheelchair or bedside chair)?: A Lot Help needed to walk in hospital room?: A Lot Help needed climbing 3-5 steps with a railing? : Total 6 Click Score: 12    End of Session Equipment Utilized During Treatment: Oxygen Activity Tolerance: Patient tolerated treatment well Patient left: with call bell/phone within reach;with family/visitor present;in chair (chair alarm not set as pt's family says they will be with him until he gets back to bed, RN aware) Nurse Communication: Mobility status PT Visit Diagnosis: Unsteadiness on feet (R26.81);Other abnormalities of gait and mobility (R26.89);Muscle weakness (generalized) (M62.81)     Time: 6606-3016 PT Time Calculation (min) (ACUTE ONLY): 17 min  Charges:  $Therapeutic Activity: 8-22 mins                     Stacie Glaze, PT DPT Acute Rehabilitation Services Pager [phone]  Office [phone]    Roxine Caddy E Ruffin Pyo 02/04/2022, 5:01 PM

## 2022-02-04 NOTE — Plan of Care (Signed)
  Problem: Fluid Volume: Goal: Hemodynamic stability will improve Outcome: Progressing   

## 2022-02-04 NOTE — Progress Notes (Signed)
PROGRESS NOTE                                                                                                                                                                                                             Patient Demographics:    Tommy Browning, is a 88 y.o. male, DOB - [birth year], VCB:[id number]  Outpatient Primary MD for the patient is Hoyt Koch, MD    LOS - 4  Admit date - 02/24/2022    Chief Complaint  Patient presents with   Altered Mental Status       Brief Narrative (HPI from H&P)    88 y.o. male with medical history significant for BPH, hypothyroidism and dementia was brought in by EMS for evaluation of a one-week history of weakness and lethargy with decreased oral intake, and frequent urination that was 'very dark to red".  At baseline patient is ambulant unable to participate in ADLs however he has been staying in the bed and gets agitated when encouraged to eat or do anything.  The ER he was diagnosed with urinary retention with sepsis, UTI and toxic encephalopathy and admitted.   Subjective:   Patient in bed denies any headache chest or abdominal pain, mild shortness of breath and cough.   Assessment  & Plan :   Sepsis secondary to UTI caused by urinary retention.  Underlying history of BPH. Foley placed in the ER, on empiric antibiotics, sepsis pathophysiology is improving, add Flomax with outpatient urology follow-up.  Will be discharged with Foley catheter.  Possible aspiration pneumonia.  High white count, low-grade fevers with rising procalcitonin.  At risk for aspiration, might have early aspiration pneumonia although chest x-ray not revealing, switch antibiotics to Unasyn on 02/03/2022, seen by speech currently n.p.o. except medications, keep head of the bed elevated, aspiration precautions.  Overall getting progressively weak due to underlying dementia and deconditioning, weak neck and  throat muscles, risk for ongoing aspiration even with oral secretions is very high.  Explained to the family in detail.  Frequent hiccups.  Improved with Thorazine.  KUB stable.  Dehydration, hypernatremia and AKI.  Hydrated with IV fluids and monitor.  CT abdomen pelvis nonacute.   Acute metabolic encephalopathy in a patient with underlying dementia, extremely frail and deconditioned with advanced age - Secondary to sepsis.  He is pretty deconditioned and  frail at baseline, according to the outpatient notes he stopped taking his dementia medications 4 to 5 months ago, had poor appetite with weight loss ongoing for several weeks.  Was becoming increasingly withdrawn, not eating or drinking at least 10 days prior to hospital admission.  Overall extremely frail, very high risk for repeated aspiration, poor long-term prognosis.  Continue supportive care.  DNR now.  Hypothyroidism - Not currently on levothyroxine, stable TSH  Dementia without behavioral disturbance - Hold mirtazapine due to lethargy, delirium precautions, minimize narcotics and benzodiazepines.  Mild shortness of breath on 02/01/2022.  Was nonspecific, question of mild fluid overload and nonspecific CHF, no echo on file, obtain echocardiogram, fluids reduced, symptoms resolved chest x-ray stable continue to monitor.       Condition -  Guarded  Family Communication  : Daughter and wife in detail on 02/01/2022, wife at bedside on 02/02/2022, daughter and wife bedside 02/03/2022 , detailed discussion bedside with daughter, wife and niece on 02/04/2022.  Now DNR.  Continue medical treatment but no excessive heroics.  Code Status : DNR  Consults  : Palliative care for goals of care  PUD Prophylaxis :    Procedures  :     TTE - 1. Left ventricular ejection fraction, by estimation, is 70 to 75%. The left ventricle has hyperdynamic function. The left ventricle has no regional wall motion abnormalities. Left ventricular diastolic  parameters are indeterminate.  2. Right ventricular systolic function is normal. The right ventricular size is normal. Tricuspid regurgitation signal is inadequate for assessing PA pressure.  3. The mitral valve is normal in structure. No evidence of mitral valve regurgitation. No evidence of mitral stenosis.  4. The aortic valve was not well visualized. Aortic valve regurgitation is not visualized. No aortic stenosis is present.  5. The inferior vena cava is normal in size with <50% respiratory variability, suggesting right atrial pressure of 8 mmHg.  CT head.  Non acute  CT abdomen pelvis -  Unremarkable CT abdomen/pelvis.      Disposition Plan  :    Status is: Inpatient  DVT Prophylaxis  :    enoxaparin (LOVENOX) injection 40 mg Start: 02/03/22 1000    Lab Results  Component Value Date   PLT 468 (H) 02/04/2022    Diet :  Diet Order             Diet NPO time specified Except for: Sips with Meds  Diet effective now                    Inpatient Medications  Scheduled Meds:  aspirin EC  81 mg Oral Daily   Chlorhexidine Gluconate Cloth  6 each Topical Daily   cyanocobalamin  1,000 mcg Oral Daily   docusate sodium  200 mg Oral BID   enoxaparin (LOVENOX) injection  40 mg Subcutaneous Daily   latanoprost  1 drop Both Eyes QHS   metoprolol tartrate  25 mg Oral BID   midodrine  10 mg Oral BID WC   omega-3 acid ethyl esters  1 g Oral Daily   polyethylene glycol  17 g Oral BID   tamsulosin  0.4 mg Oral Daily   Continuous Infusions:  ampicillin-sulbactam (UNASYN) IV 3 g (02/04/22 0358)   dextrose 5 % with KCl 20 mEq / L 75 mL/hr at 02/03/22 2250   PRN Meds:.acetaminophen **OR** acetaminophen, ondansetron **OR** ondansetron (ZOFRAN) IV    Objective:   Vitals:   02/03/22 2200 02/03/22 2300 02/03/22 2357  02/04/22 0359  BP: 107/67 113/75 108/68 114/78  Pulse:   (!) 103 (!) 109  Resp: (!) 25 (!) '30 20 17  '$ Temp:   98.1 F (36.7 C) 98.1 F (36.7 C)  TempSrc:   Oral  Oral  SpO2:   100% 100%  Weight:      Height:        Wt Readings from Last 3 Encounters:  02/01/22 65.8 kg  01/27/22 65.8 kg  09/24/21 69.3 kg     Intake/Output Summary (Last 24 hours) at 02/04/2022 0821 Last data filed at 02/04/2022 0600 Gross per 24 hour  Intake 1437.15 ml  Output 450 ml  Net 987.15 ml     Physical Exam  Sleepy but more arousable today, oriented x1, no focal deficits, having frequent hiccups, [place].AT,PERRAL Supple Neck, No JVD,   Symmetrical Chest wall movement, Good air movement bilaterally, CTAB RRR,No Gallops, Rubs or new Murmurs,  +ve B.Sounds, Abd Soft, No tenderness,   No Cyanosis, Clubbing or edema     Data Review:    CBC Recent Labs  Lab 01/30/2022 2123 02/01/22 0230 02/02/22 0447 02/03/22 0442 02/04/22 0646  WBC 19.2* 20.6* 19.8* 22.0* 25.3*  HGB 14.7 13.2 12.3* 11.7* 11.8*  HCT 43.2 41.6 35.8* 33.7* 32.9*  PLT 461* 489* 424* 438* 468*  MCV 89.6 92.4 88.0 87.5 85.5  MCH 30.5 29.3 30.2 30.4 30.6  MCHC 34.0 31.7 34.4 34.7 35.9  RDW 13.8 13.9 13.9 14.2 14.0  LYMPHSABS 0.9  --  1.1 1.3 PENDING  MONOABS 2.0*  --  2.1* 2.2* PENDING  EOSABS 0.1  --  0.4 0.4 PENDING  BASOSABS 0.0  --  0.1 0.1 PENDING    Electrolytes Recent Labs  Lab 02/01/22 0230 02/02/22 0447 02/03/22 0135 02/03/22 0442 02/03/22 0703 02/04/22 0646  NA 147* 141 134* 134*  --  134*  K 4.1 3.3* 3.6 3.6  --  3.7  CL 109 105 100 99  --  100  CO2 '27 24 22 22  '$ --  24  GLUCOSE 116* 100* 109* 99  --  132*  BUN 49* 38* 34* 34*  --  33*  CREATININE 1.83* 1.47* 1.38* 1.34*  --  1.36*  CALCIUM 9.3 8.6* 8.2* 8.3*  --  8.2*  MG  --  2.2  --  2.2  --  2.2  CRP  --   --   --   --  35.1*  --   PROCALCITON  --   --   --   --  17.48  --   TSH 1.216  --   --   --   --   --   BNP  --  195.6*  --  215.1*  --   --       Radiology Reports EEG adult  Result Date: 02/03/2022 Lora Havens, MD     02/03/2022  8:20 PM Patient Name: Karl Luke. MRN: [id number] Epilepsy  Attending: Lora Havens Referring Physician/Provider: Thurnell Lose, MD Date: 02/03/2022 Duration: 23.17 mins Patient history: 88yo M with ams. EEG to evaluate for seizure Level of alertness: Awake, asleep AEDs during EEG study: None Technical aspects: This EEG study was done with scalp electrodes positioned according to the 10-20 International system of electrode placement. Electrical activity was reviewed with band pass filter of 1-'70Hz'$ , sensitivity of 7 uV/mm, display speed of 92m/sec with a '60Hz'$  notched filter applied as appropriate. EEG data were recorded continuously and digitally stored.  Video monitoring  was available and reviewed as appropriate. Description: The posterior dominant rhythm consists of 8 Hz activity of moderate voltage (25-35 uV) seen predominantly in posterior head regions, symmetric and reactive to eye opening and eye closing. Sleep was characterized by vertex waves, sleep spindles (12 to 14 Hz), maximal frontocentral region. EEG showed intermittent generalized 5 to 6 Hz theta slowing. Hyperventilation and photic stimulation were not performed.   ABNORMALITY - Intermittent slow, generalized IMPRESSION: This study is suggestive of mild diffuse encephalopathy, nonspecific etiology. No seizures or epileptiform discharges were seen throughout the recording. Lora Havens   DG Chest Port 1 View  Result Date: 02/03/2022 CLINICAL DATA:  88 year old male with altered mental status, nausea, leukocytosis. EXAM: PORTABLE CHEST 1 VIEW COMPARISON:  Recent CT Abdomen and Pelvis 02/01/2022. Portable chest x-ray yesterday. FINDINGS: Portable AP semi upright view at 0719 hours. Stable low lung volumes. Stable mediastinal contours, no cardiomegaly. Allowing for portable technique the lungs are clear. Visualized tracheal air column is within normal limits. Stable visualized osseous structures. IMPRESSION: Stable low lung volumes. No acute cardiopulmonary abnormality. Electronically Signed   By:  Genevie Ann M.D.   On: 02/03/2022 07:35   DG Abd Portable 1V  Result Date: 02/03/2022 CLINICAL DATA:  88 year old male with altered mental status, nausea, leukocytosis. EXAM: PORTABLE ABDOMEN - 1 VIEW COMPARISON:  Recent CT Abdomen and Pelvis 02/01/2022. FINDINGS: Portable AP view at 0716 hours. Bowel gas pattern is similar and remains non obstructed. Visible abdominal and pelvic visceral contours appear stable. Pelvic phleboliths. Stable visualized osseous structures. IMPRESSION: Bowel-gas pattern remains non obstructed, no acute radiographic finding. Electronically Signed   By: Genevie Ann M.D.   On: 02/03/2022 07:34   ECHOCARDIOGRAM COMPLETE  Result Date: 02/02/2022    ECHOCARDIOGRAM REPORT   Patient Name:   Shafer Swamy. Date of Exam: 02/02/2022 Medical Rec #:  [id number]       Height:       69.0 in Accession #:    [id number]      Weight:       145.0 lb Date of Birth:  [birth year]       BSA:          1.802 m Patient Age:    63 years        BP:           95/66 mmHg Patient Gender: M               HR:           108 bpm. Exam Location:  Inpatient Procedure: 2D Echo, Cardiac Doppler, Color Doppler and Intracardiac            Opacification Agent Indications:    CHF- Acute Diastolic G64.40  History:        Patient has no prior history of Echocardiogram examinations.                 Abnormal ECG; Risk Factors:Dyslipidemia.  Sonographer:    Ronny Flurry Referring Phys: Graylin Shiver [facility]  Sonographer Comments: No parasternal window, suboptimal apical window and Technically difficult study due to poor echo windows. Image acquisition challenging due to patient body habitus and Image acquisition challenging due to respiratory motion. IMPRESSIONS  1. Left ventricular ejection fraction, by estimation, is 70 to 75%. The left ventricle has hyperdynamic function. The left ventricle has no regional wall motion abnormalities. Left ventricular diastolic parameters are indeterminate.  2. Right ventricular systolic function is  normal. The right ventricular  size is normal. Tricuspid regurgitation signal is inadequate for assessing PA pressure.  3. The mitral valve is normal in structure. No evidence of mitral valve regurgitation. No evidence of mitral stenosis.  4. The aortic valve was not well visualized. Aortic valve regurgitation is not visualized. No aortic stenosis is present.  5. The inferior vena cava is normal in size with <50% respiratory variability, suggesting right atrial pressure of 8 mmHg. FINDINGS  Left Ventricle: Left ventricular ejection fraction, by estimation, is 70 to 75%. The left ventricle has hyperdynamic function. The left ventricle has no regional wall motion abnormalities. Definity contrast agent was given IV to delineate the left ventricular endocardial borders. The left ventricular internal cavity size was small. There is no left ventricular hypertrophy. Left ventricular diastolic parameters are indeterminate. Right Ventricle: The right ventricular size is normal. No increase in right ventricular wall thickness. Right ventricular systolic function is normal. Tricuspid regurgitation signal is inadequate for assessing PA pressure. Left Atrium: Left atrial size was normal in size. Right Atrium: Right atrial size was normal in size. Pericardium: Trivial pericardial effusion is present. Mitral Valve: The mitral valve is normal in structure. No evidence of mitral valve regurgitation. No evidence of mitral valve stenosis. Tricuspid Valve: The tricuspid valve is normal in structure. Tricuspid valve regurgitation is trivial. Aortic Valve: The aortic valve was not well visualized. Aortic valve regurgitation is not visualized. No aortic stenosis is present. Aortic valve mean gradient measures 6.0 mmHg. Aortic valve peak gradient measures 10.5 mmHg. Pulmonic Valve: The pulmonic valve was not well visualized. Pulmonic valve regurgitation is not visualized. Aorta: The aortic root was not well visualized. Venous: The inferior  vena cava is normal in size with less than 50% respiratory variability, suggesting right atrial pressure of 8 mmHg. IAS/Shunts: The interatrial septum was not well visualized.   LV Volumes (MOD) LV vol d, MOD A2C: 48.5 ml Diastology LV vol d, MOD A4C: 82.9 ml LV e' medial:    6.20 cm/s LV vol s, MOD A2C: 13.4 ml LV E/e' medial:  11.8 LV vol s, MOD A4C: 16.6 ml LV e' lateral:   7.62 cm/s LV SV MOD A2C:     35.1 ml LV E/e' lateral: 9.6 LV SV MOD A4C:     82.9 ml LV SV MOD BP:      49.4 ml RIGHT VENTRICLE            IVC RV Basal diam:  2.30 cm    IVC diam: 1.60 cm RV S prime:     9.95 cm/s TAPSE (M-mode): 1.2 cm LEFT ATRIUM           Index        RIGHT ATRIUM          Index LA Vol (A2C): 21.0 ml 11.65 ml/m  RA Area:     9.94 cm LA Vol (A4C): 15.4 ml 8.55 ml/m   RA Volume:   17.90 ml 9.93 ml/m  AORTIC VALVE AV Vmax:           162.00 cm/s AV Vmean:          111.000 cm/s AV VTI:            0.240 m AV Peak Grad:      10.5 mmHg AV Mean Grad:      6.0 mmHg LVOT Vmax:         135.67 cm/s LVOT Vmean:        91.633 cm/s LVOT VTI:  0.195 m LVOT/AV VTI ratio: 0.81 MITRAL VALVE MV Area (PHT): 4.39 cm    SHUNTS MV Decel Time: 173 msec    Systemic VTI: 0.19 m MR Peak grad: 8.3 mmHg MR Vmax:      144.00 cm/s MV E velocity: 73.20 cm/s MV A velocity: 86.40 cm/s MV E/A ratio:  0.85 Oswaldo Milian MD Electronically signed by Oswaldo Milian MD Signature Date/Time: 02/02/2022/3:07:23 PM    Final    DG Chest Port 1 View  Result Date: 02/02/2022 CLINICAL DATA:  88 year old male with history of shortness of breath. EXAM: PORTABLE CHEST 1 VIEW COMPARISON:  Chest x-ray 02/01/2022. FINDINGS: Lung volumes are normal. No consolidative airspace disease. No pleural effusions. No pneumothorax. No pulmonary nodule or mass noted. Pulmonary vasculature and the cardiomediastinal silhouette are within normal limits. IMPRESSION: No radiographic evidence of acute cardiopulmonary disease. Electronically Signed   By: Vinnie Langton M.D.   On: 02/02/2022 07:09   DG Chest Port 1 View  Result Date: 02/01/2022 CLINICAL DATA:  Shortness of breath EXAM: PORTABLE CHEST 1 VIEW COMPARISON:  01/30/2022 FINDINGS: Lungs are clear.  No pleural effusion or pneumothorax. The heart is normal in size. IMPRESSION: No evidence of acute cardiopulmonary disease. Electronically Signed   By: Julian Hy M.D.   On: 02/01/2022 20:03   CT ABDOMEN PELVIS WO CONTRAST  Result Date: 02/01/2022 CLINICAL DATA:  Kidney failure weakness, decreased appetite, dementia EXAM: CT ABDOMEN AND PELVIS WITHOUT CONTRAST TECHNIQUE: Multidetector CT imaging of the abdomen and pelvis was performed following the standard protocol without IV contrast. RADIATION DOSE REDUCTION: This exam was performed according to the departmental dose-optimization program which includes automated exposure control, adjustment of the mA and/or kV according to patient size and/or use of iterative reconstruction technique. COMPARISON:  CT chest abdomen pelvis dated 04/04/2021. FINDINGS: Lower chest: Mild right basilar atelectasis. Hepatobiliary: Unenhanced liver is unremarkable. Gallbladder is unremarkable. No intrahepatic or extrahepatic duct dilatation. Pancreas: Within normal limits. Spleen: Within normal limits. Adrenals/Urinary Tract: Adrenal glands are within normal limits. 3.7 cm left lower pole renal cyst (series 3/image 39), measuring simple fluid density, benign (Bosniak I). No follow-up is recommended. Kidneys are otherwise within normal limits.  No hydronephrosis. Bladder is decompressed by an indwelling Foley catheter. Stomach/Bowel: Stomach is within normal limits. No evidence of bowel obstruction. Normal appendix (series 3/image 62). Scattered mild colonic diverticulosis, without evidence of diverticulitis. Vascular/Lymphatic: No evidence of abdominal aortic aneurysm. Atherosclerotic calcifications of the abdominal aorta and branch vessels. No suspicious abdominopelvic  lymphadenopathy. Reproductive: Prostate is grossly unremarkable. Other: No abdominopelvic ascites. Musculoskeletal: Mild degenerative changes of the lower thoracic spine. IMPRESSION: Unremarkable CT abdomen/pelvis. Electronically Signed   By: Julian Hy M.D.   On: 02/01/2022 01:44   CT HEAD WO CONTRAST (5MM)  Result Date: 02/23/2022 CLINICAL DATA:  Mental status change, unknown cause EXAM: CT HEAD WITHOUT CONTRAST TECHNIQUE: Contiguous axial images were obtained from the base of the skull through the vertex without intravenous contrast. RADIATION DOSE REDUCTION: This exam was performed according to the departmental dose-optimization program which includes automated exposure control, adjustment of the mA and/or kV according to patient size and/or use of iterative reconstruction technique. COMPARISON:  None Available. FINDINGS: Brain: Generalized atrophy is normal for age. There is moderate to advanced periventricular and deep white matter hypodensity typical of chronic small vessel ischemia. Remote lacunar infarcts in the right basal ganglia. No evidence of acute ischemia. No hemorrhage. No subdural or extra-axial collection. No hydrocephalus, midline shift or mass effect. Vascular: Atherosclerosis  of skullbase vasculature without hyperdense vessel or abnormal calcification. Skull: No fracture or focal lesion. Sinuses/Orbits: No acute findings.  Bilateral intersection. Other: None. IMPRESSION: 1. No acute intracranial abnormality. 2. Generalized atrophy and chronic small vessel ischemia. Remote lacunar infarcts in the right basal ganglia. Electronically Signed   By: Keith Rake M.D.   On: 02/05/2022 21:54   DG Chest Portable 1 View  Result Date: 02/08/2022 CLINICAL DATA:  Altered mental status EXAM: PORTABLE CHEST 1 VIEW COMPARISON:  Radiographs 04/05/2017 FINDINGS: No focal consolidation, pleural effusion, or pneumothorax. Normal cardiomediastinal silhouette. No acute osseous abnormality.  IMPRESSION: No active disease. Electronically Signed   By: Placido Sou M.D.   On: 02/15/2022 21:49      Signature  Lala Lund M.D on 02/04/2022 at 8:21 AM   -  To page go to [email or web address]

## 2022-02-04 NOTE — Progress Notes (Signed)
Speech Language Pathology Treatment: Dysphagia  Patient Details Name: Tommy Browning. MRN: [id number] DOB: 17-Apr-1929 Today's Date: 02/04/2022 Time: 0037-0488 SLP Time Calculation (min) (ACUTE ONLY): 15 min  Assessment / Plan / Recommendation Clinical Impression  Patient seen by SLP for skilled treatment focused on dysphagia goals. Spouse and daughter both present in room and patient in recliner, having just finished PT session. He was drowsy/fatigued but able to maintain adequate alertness to verbally respond and to hold cup of water and give self sips via straw. He exhibited some instances of immediate or mildly delayed cough which SLP suspects is at least in part due to suspected delayed and discoordinated swallow. Several times, patient appeared to be starting to hiccup and/or breathing air in through mouth while drinking water. After patient handed SLP the cup and said he was done, he seemed to have more frequent hiccupping. Per family, this hiccupping all started upon admission to hospital on Sunday. SLP is recommending to continue with NPO status (except meds crushed in puree, sips of water/ice) and SLP will continue to follow for PO readiness.   HPI HPI: 88 y.o. male presented from home to ED with sepsis secondary to UTI, AKI, acute metabolic encephalopathy. PMHx of dementia, hypothyroidism, BPH.      SLP Plan  Continue with current plan of care      Recommendations for follow up therapy are one component of a multi-disciplinary discharge planning process, led by the attending physician.  Recommendations may be updated based on patient status, additional functional criteria and insurance authorization.    Recommendations  Diet recommendations: NPO Medication Administration: Crushed with puree                Oral Care Recommendations: Oral care QID;Oral care prior to ice chip/H20 Follow Up Recommendations: Follow physician's recommendations for discharge plan and follow up  therapies Assistance recommended at discharge: Frequent or constant Supervision/Assistance SLP Visit Diagnosis: Dysphagia, unspecified (R13.10) Plan: Continue with current plan of care          Sonia Baller, MA, CCC-SLP Speech Therapy

## 2022-02-04 NOTE — Progress Notes (Signed)
     This pt has been referred to our [facility] program. This is a home-based Palliative care program that is provided by [facility]. We will follow the pt at home after discharge to assist with any chronic/acute symptom management needs. We utilize his PCP as the attending for this program. He will be getting nursing visits 1-3 times a month and SW support in the home with these services. He will also have after hours nursing support as well.    He is eligible for other [facility] services in home with our program in place as well.  Thank you Webb Silversmith RN [phone]

## 2022-02-04 NOTE — Progress Notes (Incomplete)
Patient

## 2022-02-04 NOTE — Progress Notes (Signed)
Daily Progress Note   Patient Name: Tommy Browning.       Date: 02/04/2022 DOB: 06-06-1929  Age: 88 y.o. MRN#: [id number] Attending Physician: Thurnell Lose, MD Primary Care Physician: Hoyt Koch, MD Admit Date: 02/15/2022  Reason for Consultation/Follow-up: Establishing goals of care  Patient Profile/HPI:   88 y.o. male  with past medical history of mild dementia, hypothyroid, BPH admitted on 02/01/2022 with sepsis due to UTI likely related to urinary retention from BPH.  He has a Foley in place.  He has had some transient shortness of breath with given dose of Lasix.  Chest x-ray was clear.  Echocardiogram is pending.  Palliative medicine consulted for 88 yr old, frail with some dementia, Uro sepsis, GOC.    Subjective: Chart reviewed including labs, progress notes, imaging from this and previous encounters.  Unfortunately, his WBC are continuing to rise. CRP is also elevated, lactic acid improved.  He is more awake than yesterday, but still not at baseline mental status.  Met again with his daughter, wife, and niece at bedside.  Again discussed baseline status with family they note that in the last week of October patient was ambulatory and independent with ADL's. He could carry on normal conversation with family- but had changes in memory. He was not taking dementia medications because family felt they were not making any difference. He has had an overall decline in appetite but was still eating. They shared pictures of him holding his adorable great grandchildren recently.  We discussed his acute illness and how it can affect even mild dementia. Discussed how UTI and pneumonia can leave patients very weak and make recovery to functional state very difficult, even if he is able to  survive this hospitalization. We also discussed that if he survives, his functional status will be decreased from what it was prior to this acute illness.  Family agreed to change in code status this morning with attending after considering discussion we had  yesterday. They note that our conversation was the first time they had discussed this and also faced the reality of Mr. Morici mortality and they were working on accepting and processing this.  We discussed that we continue to be hopeful for improvements over the next 24-48 hours, however, if he does not show improvements, this could indicate  he is at end of life. We discussed how he is very conditioned which puts him at risk of continued aspiration- concept of comfort feeding was presented. We discussed hospice eligibility.   Review of Systems  Unable to perform ROS: Mental status change     Physical Exam Vitals and nursing note reviewed.  Constitutional:      Comments: Lethargic, frail  Cardiovascular:     Rate and Rhythm: Normal rate.  Pulmonary:     Effort: Pulmonary effort is normal.             Vital Signs: BP 117/77 (BP Location: Right Arm)   Pulse (!) 111   Temp 99 F (37.2 C) (Axillary)   Resp 17   Ht _0  (1.753 m)   Wt 65.8 kg   SpO2 100%   BMI 21.41 kg/m  SpO2: SpO2: 100 % O2 Device: O2 Device: Nasal Cannula O2 Flow Rate: O2 Flow Rate (L/min): 2 L/min  Intake/output summary:  Intake/Output Summary (Last 24 hours) at 02/04/2022 1543 Last data filed at 02/04/2022 0600 Gross per 24 hour  Intake 1437.15 ml  Output 300 ml  Net 1137.15 ml   LBM: Last BM Date : 02/01/22 Baseline Weight: Weight: 68 kg Most recent weight: Weight: 65.8 kg       Palliative Assessment/Data: PPS: 30%      Patient Active Problem List   Diagnosis Date Noted   Acute urinary retention 02/26/2022   Sepsis secondary to UTI ([place]) 01/30/2022   Dementia without behavioral disturbance ([place]) 65/99/3570   Acute metabolic  encephalopathy 17/79/3903   Hypothyroidism 02/24/2022   AKI (acute kidney injury) ([place]) 01/30/2022   Nausea 05/31/2020   Memory disorder 05/01/2016   Loss of weight 07/23/2015   Graves' eye disease 04/16/2011   Routine general medical examination at a health care facility 01/27/2011   HYPOGONADISM 02/03/2010   Hyperlipidemia 05/30/2009   BPH (benign prostatic hyperplasia) 02/26/2009   ELECTROCARDIOGRAM, ABNORMAL 12/08/2006    Palliative Care Assessment & Plan    Assessment/Recommendations/Plan  Continue current efforts to treat what is treatable- there is some confusion regarding his baseline status prior to this acute illness- see above discussion- cousin notes that she did mention "decline" to attending saying he was "thinner and slower"- but did not mean that he was bedbound, he had what they considered a good quality of life If patient does not show improvements in the next 24-48 hours will begin discussion of comfort measures and hospice Based on his previous functional status he would not be eligible for hospice on his dementia diagnosis alone- however, this may be a new baseline and if so then he would definitely meet hospice eligibility - GOC continue to be towards working for improvements, but family understands patient is critically ill and may be facing end of life   Code Status: DNR  Prognosis:  Unable to determine  Discharge Planning: To Be Determined  Care plan was discussed with family   Thank you for allowing the Palliative Medicine Team to assist in the care of this patient.  Total time: 80 minutes Greater than 50%  of this time was spent counseling and coordinating care related to the above assessment and plan.  Mariana Kaufman, AGNP-C Palliative Medicine   Please contact Palliative Medicine Team phone at [phone] for questions and concerns.

## 2022-02-05 ENCOUNTER — Inpatient Hospital Stay (HOSPITAL_COMMUNITY): Payer: Medicare PPO

## 2022-02-05 DIAGNOSIS — R54 Age-related physical debility: Secondary | ICD-10-CM | POA: Diagnosis not present

## 2022-02-05 DIAGNOSIS — A419 Sepsis, unspecified organism: Secondary | ICD-10-CM | POA: Diagnosis not present

## 2022-02-05 DIAGNOSIS — N390 Urinary tract infection, site not specified: Secondary | ICD-10-CM | POA: Diagnosis not present

## 2022-02-05 DIAGNOSIS — Z7189 Other specified counseling: Secondary | ICD-10-CM | POA: Diagnosis not present

## 2022-02-05 DIAGNOSIS — F03A0 Unspecified dementia, mild, without behavioral disturbance, psychotic disturbance, mood disturbance, and anxiety: Secondary | ICD-10-CM | POA: Diagnosis not present

## 2022-02-05 LAB — BASIC METABOLIC PANEL
Anion gap: 12 (ref 5–15)
BUN: 43 mg/dL — ABNORMAL HIGH (ref 8–23)
CO2: 26 mmol/L (ref 22–32)
Calcium: 8.1 mg/dL — ABNORMAL LOW (ref 8.9–10.3)
Chloride: 101 mmol/L (ref 98–111)
Creatinine, Ser: 1.78 mg/dL — ABNORMAL HIGH (ref 0.61–1.24)
GFR, Estimated: 36 mL/min — ABNORMAL LOW (ref >60)
Glucose, Bld: 99 mg/dL (ref 70–99)
Potassium: 3.6 mmol/L (ref 3.5–5.1)
Sodium: 139 mmol/L (ref 135–145)

## 2022-02-05 LAB — CBC WITH DIFFERENTIAL/PLATELET
Abs Immature Granulocytes: 1.05 10*3/uL — ABNORMAL HIGH (ref 0.00–0.07)
Basophils Absolute: 0.1 10*3/uL (ref 0.0–0.1)
Basophils Relative: 0 %
Eosinophils Absolute: 0.3 10*3/uL (ref 0.0–0.5)
Eosinophils Relative: 1 %
HCT: 31.1 % — ABNORMAL LOW (ref 39.0–52.0)
Hemoglobin: 10.8 g/dL — ABNORMAL LOW (ref 13.0–17.0)
Immature Granulocytes: 4 %
Lymphocytes Relative: 4 %
Lymphs Abs: 1.1 10*3/uL (ref 0.7–4.0)
MCH: 30.2 pg (ref 26.0–34.0)
MCHC: 34.7 g/dL (ref 30.0–36.0)
MCV: 86.9 fL (ref 80.0–100.0)
Monocytes Absolute: 1.9 10*3/uL — ABNORMAL HIGH (ref 0.1–1.0)
Monocytes Relative: 7 %
Neutro Abs: 21 10*3/uL — ABNORMAL HIGH (ref 1.7–7.7)
Neutrophils Relative %: 84 %
Platelets: 469 10*3/uL — ABNORMAL HIGH (ref 150–400)
RBC: 3.58 MIL/uL — ABNORMAL LOW (ref 4.22–5.81)
RDW: 14.1 % (ref 11.5–15.5)
WBC: 25.4 10*3/uL — ABNORMAL HIGH (ref 4.0–10.5)
nRBC: 0 % (ref 0.0–0.2)

## 2022-02-05 LAB — PROCALCITONIN: Procalcitonin: 6.42 ng/mL

## 2022-02-05 LAB — BRAIN NATRIURETIC PEPTIDE: B Natriuretic Peptide: 278.6 pg/mL — ABNORMAL HIGH (ref 0.0–100.0)

## 2022-02-05 LAB — C-REACTIVE PROTEIN: CRP: 37 mg/dL — ABNORMAL HIGH (ref <1.0)

## 2022-02-05 LAB — MAGNESIUM: Magnesium: 2.3 mg/dL (ref 1.7–2.4)

## 2022-02-05 MED ORDER — DEXTROSE-NACL 5-0.45 % IV SOLN: INTRAVENOUS | Status: DC

## 2022-02-05 MED ORDER — ALBUMIN HUMAN 25 % IV SOLN
50.0000 g | Freq: Four times a day (QID) | INTRAVENOUS | Status: AC
  Administered 2022-02-05 (×3): 50 g via INTRAVENOUS
  Filled 2022-02-05 (×3): qty 200

## 2022-02-05 MED ORDER — SODIUM CHLORIDE 0.9 % IV SOLN
3.0000 g | Freq: Two times a day (BID) | INTRAVENOUS | Status: DC
  Administered 2022-02-05 – 2022-02-06 (×3): 3 g via INTRAVENOUS
  Filled 2022-02-05 (×3): qty 8

## 2022-02-05 MED ORDER — SALINE SPRAY 0.65 % NA SOLN: 1.0000 | NASAL | Status: DC | PRN

## 2022-02-05 MED ORDER — ENOXAPARIN SODIUM 30 MG/0.3ML IJ SOSY
30.0000 mg | PREFILLED_SYRINGE | Freq: Every day | INTRAMUSCULAR | Status: DC
  Administered 2022-02-06: 30 mg via SUBCUTANEOUS
  Filled 2022-02-05: qty 0.3

## 2022-02-05 MED ORDER — MIDODRINE HCL 5 MG PO TABS
10.0000 mg | ORAL_TABLET | Freq: Three times a day (TID) | ORAL | Status: DC
  Administered 2022-02-05 – 2022-02-06 (×5): 10 mg via ORAL
  Filled 2022-02-05 (×5): qty 2

## 2022-02-05 NOTE — TOC Progression Note (Signed)
Transition of Care (TOC) - Progression Note    Patient Details  Name: Tommy Browning. MRN: [id number] Date of Birth: [birth year]  Transition of Care [facility]) CM/SW Contact  Cyndi Bender, RN Phone Number: 02/05/2022, 12:47 PM  Clinical Narrative:     Patient is not medically stable for discharge.  Palliative medicine has been consulted and is following patient.   TOC will continue to follow for needs.  Expected Discharge Plan: [place] Barriers to Discharge: Continued Medical Work up  Expected Discharge Plan and Services Expected Discharge Plan: [place] In-house Referral: Clinical Social Work, Hospice / Palliative Care Discharge Planning Services: CM Consult Post Acute Care Choice: Home Health, Durable Medical Equipment Living arrangements for the past 2 months: Single Family Home                 DME Arranged: Bedside commode DME Agency: [employer] Date DME Agency Contacted: 02/03/22 Time DME Agency Contacted: [id number] Representative spoke with at DME Agency: Myrtha Mantis HH Arranged: RN, PT, OT, Nurse's Aide, Speech Therapy, Social Work [place] Agency: [place] Date [place]: 02/03/22 Time [place]: 1551 Representative spoke with at [place] and [place] for palliative services   Social Determinants of Health ([place]) Interventions    Readmission Risk Interventions     No data to display

## 2022-02-05 NOTE — Progress Notes (Signed)
Occupational Therapy Treatment Patient Details Name: Tommy Browning. MRN: [id number] DOB: 1930-01-21 Today's Date: 02/05/2022   History of present illness Pt is a 88 yo male admitted with weakness, lethargy, not eating and dark urine. Pt founc to have UTI and toxic encephalopathy as well as sepsis. PMH: BPH, hypothyroid, dementia.   OT comments  Pt making very slow progress toward set goals. Pt requiring mod assist for all mobility and activity tolerance is limited.  Pt with significant L side neck pain.  Addressed positioning with the family and worked on stretches to aid in positioning of neck in and out of bed.  Feel pt will need SNF at DC.   Recommendations for follow up therapy are one component of a multi-disciplinary discharge planning process, led by the attending physician.  Recommendations may be updated based on patient status, additional functional criteria and insurance authorization.    Follow Up Recommendations  Skilled nursing-short term rehab (<3 hours/day)    Assistance Recommended at Discharge Frequent or constant Supervision/Assistance  Patient can return home with the following  A little help with walking and/or transfers;A little help with bathing/dressing/bathroom;Assistance with cooking/housework;Assist for transportation;Help with stairs or ramp for entrance;Direct supervision/assist for medications management;Direct supervision/assist for financial management   Equipment Recommendations  None recommended by OT    Recommendations for Other Services      Precautions / Restrictions Precautions Precautions: Fall Precaution Comments: monitor HR with activity. Up to 135 standing. Restrictions Weight Bearing Restrictions: No       Mobility Bed Mobility Overal bed mobility: Needs Assistance Bed Mobility: Supine to Sit     Supine to sit: Mod assist     General bed mobility comments: assist for trunk and LE management, increased time and cues for sequencing  throughout    Transfers Overall transfer level: Needs assistance Equipment used: 1 person hand held assist Transfers: Sit to/from Stand, Bed to chair/wheelchair/BSC Sit to Stand: Mod assist Stand pivot transfers: Mod assist         General transfer comment: assist for power up, rise, steadying, and safe pivot to bed.     Balance Overall balance assessment: Needs assistance Sitting-balance support: Feet supported Sitting balance-Leahy Scale: Fair     Standing balance support: During functional activity, Bilateral upper extremity supported Standing balance-Leahy Scale: Poor Standing balance comment: walker and mod assist for static standing                           ADL either performed or assessed with clinical judgement   ADL Overall ADL's : Needs assistance/impaired Eating/Feeding: Set up;Sitting   Grooming: Wash/dry hands;Wash/dry face;Oral care;Set up;Sitting                   Toilet Transfer: Moderate assistance;Squat-pivot;BSC/3in1 Toilet Transfer Details (indicate cue type and reason): Pt very fearful of falling and pushing back on therapist during transfer therefore squat pivot completed. Toileting- Clothing Manipulation and Hygiene: Maximal assistance;Sit to/from stand;+2 for physical assistance Toileting - Clothing Manipulation Details (indicate cue type and reason): second person needed to clean pt while therapist stood with pt.     Functional mobility during ADLs: Maximal assistance General ADL Comments: Pt appears to present weaker today requiring mod to max assist with all mobility and transfers today. pt c/o neck pain and when standing is only focused on sitting.  Pt fearful of falling making transfers more difficult.    Extremity/Trunk Assessment Upper Extremity Assessment Upper Extremity  Assessment: Generalized weakness   Lower Extremity Assessment Lower Extremity Assessment: Defer to PT evaluation        Vision   Vision  Assessment?: No apparent visual deficits   Perception     Praxis      Cognition Arousal/Alertness: Awake/alert Behavior During Therapy: Flat affect Overall Cognitive Status: History of cognitive impairments - at baseline Area of Impairment: Attention, Memory, Safety/judgement, Awareness, Problem solving                 Orientation Level: Time Current Attention Level: Sustained Memory: Decreased short-term memory   Safety/Judgement: Decreased awareness of safety, Decreased awareness of deficits Awareness: Intellectual Problem Solving: Slow processing, Decreased initiation, Requires verbal cues, Difficulty sequencing, Requires tactile cues General Comments: Pt has baseline demential but this current state is worse than normal. pt usually knows month and year.        Exercises      Shoulder Instructions       General Comments Pt not in good position in bed on arrival. Pt slides down in bed making neck painful and in poor position.  May assist to put feet of bed up to attempt to have pt sitting up straighter in bed which may help his neck position.    Pertinent Vitals/ Pain       Pain Assessment Pain Assessment: Faces Faces Pain Scale: Hurts even more Pain Location: neck during repositioning Pain Descriptors / Indicators: Discomfort, Grimacing, Guarding Pain Intervention(s): Limited activity within patient's tolerance, Premedicated before session, Patient requesting pain meds-RN notified, Other (comment) (NP to order heat pad)  Home Living                                          Prior Functioning/Environment              Frequency  Min 2X/week        Progress Toward Goals  OT Goals(current goals can now be found in the care plan section)  Progress towards OT goals: Not progressing toward goals - comment (pt weaker today and very fearful of falling)  Acute Rehab OT Goals Patient Stated Goal: to go be with the Lord OT Goal  Formulation: With patient/family Time For Goal Achievement: 02/16/22 Potential to Achieve Goals: Fair ADL Goals Pt Will Perform Grooming: with supervision;standing Pt Will Perform Lower Body Bathing: with supervision;sit to/from stand Pt Will Perform Lower Body Dressing: with supervision;sit to/from stand Pt Will Perform Tub/Shower Transfer: Tub transfer;with min guard assist;rolling walker;shower seat Additional ADL Goal #1: Pt will walk to bathroom and complete all toileting tasks with supervision and walker.  Plan Discharge plan remains appropriate    Co-evaluation                 AM-PAC OT "6 Clicks" Daily Activity     Outcome Measure   Help from another person eating meals?: None Help from another person taking care of personal grooming?: A Little Help from another person toileting, which includes using toliet, bedpan, or urinal?: A Lot Help from another person bathing (including washing, rinsing, drying)?: A Lot Help from another person to put on and taking off regular upper body clothing?: A Lot Help from another person to put on and taking off regular lower body clothing?: A Lot 6 Click Score: 15    End of Session Equipment Utilized During Treatment: Rolling walker (2 wheels);Oxygen  OT  Visit Diagnosis: Unsteadiness on feet (R26.81)   Activity Tolerance Patient limited by fatigue   Patient Left in chair;with call bell/phone within reach;with chair alarm set   Nurse Communication Mobility status        Time: 1103-1594 OT Time Calculation (min): 32 min  Charges: OT General Charges $OT Visit: 1 Visit OT Treatments $Self Care/Home Management : 23-37 mins   Glenford Peers 02/05/2022, 12:46 PM

## 2022-02-05 NOTE — Progress Notes (Signed)
Speech Language Pathology Treatment: Dysphagia  Patient Details Name: Tommy Browning. MRN: [id number] DOB: 02/16/1930 Today's Date: 02/05/2022 Time: 6270-3500 SLP Time Calculation (min) (ACUTE ONLY): 30 min  Assessment / Plan / Recommendation Clinical Impression  Pt is alert and communicative this morning, with family saying he is looking a little more like himself so far today. Note however that upon arrival to the ED, SLP had recommended regular solids and thin liquids. Today he drank thin liquids with seemingly appropriate automaticity, but needing cues primarily to start/stop drinking. No coughing is observed but he does have changes in his respiratory pattern that could put him at an increased risk for discoordinated breathing/swallowing. With purees he needed more cueing to open his mouth, accept the bolus, initiate swallowing. R pocketing was noted requiring suction. He does not seem to be ready for a PO diet despite improvements in mentation reported by family. Would allow small amounts of water after oral care when alert enough. Precautions were reviewed with family. Will continue to follow to see if he is ready to begin diet and/or participate in MBS. MD amd RN also notified regarding lingual coating noted today.    HPI HPI: 88 y.o. male presented from home to ED with sepsis secondary to UTI, AKI, acute metabolic encephalopathy. PMHx of dementia, hypothyroidism, BPH.      SLP Plan  Continue with current plan of care      Recommendations for follow up therapy are one component of a multi-disciplinary discharge planning process, led by the attending physician.  Recommendations may be updated based on patient status, additional functional criteria and insurance authorization.    Recommendations  Diet recommendations: NPO;Other(comment) (sips of water after oral care when alert enough) Medication Administration: Crushed with puree                Oral Care Recommendations: Oral  care QID;Oral care prior to ice chip/H20 Follow Up Recommendations: Skilled nursing-short term rehab (<3 hours/day) Assistance recommended at discharge: Frequent or constant Supervision/Assistance SLP Visit Diagnosis: Dysphagia, unspecified (R13.10) Plan: Continue with current plan of care           Osie Bond., M.A. [place] Office [phone]  Secure chat preferred   02/05/2022, 12:03 PM

## 2022-02-05 NOTE — Progress Notes (Addendum)
PROGRESS NOTE                                                                                                                                                                                                             Patient Demographics:    Tommy Browning, is a 88 y.o. male, DOB - [birth year], VWP:[id number]  Outpatient Primary MD for the patient is Hoyt Koch, MD    LOS - 5  Admit date - 02/26/2022    Chief Complaint  Patient presents with   Altered Mental Status       Brief Narrative (HPI from H&P)    88 y.o. male with medical history significant for BPH, hypothyroidism and dementia was brought in by EMS for evaluation of a one-week history of weakness and lethargy with decreased oral intake, and frequent urination that was 'very dark to red".  At baseline patient is ambulant able to participate in ADLs but definitely had a sustained decline, he was less active, not eating well and required assistance in ambulation for several days prior to hospital admission, however there is a sustained pattern of deconditioning, decreased appetite and oral intake for several months. The ER he was diagnosed with urinary retention with sepsis, UTI and toxic encephalopathy and admitted.   Subjective:   Bed appears to be extremely weak, sleeping but arousable denies any headache or chest pain.   Assessment  & Plan :   Sepsis secondary to UTI caused by urinary retention.  Underlying history of BPH. Foley placed in the ER, on empiric antibiotics, sepsis pathophysiology is improving, add Flomax with outpatient urology follow-up.  Will be discharged with Foley catheter.  Possible aspiration pneumonia.  High white count, low-grade fevers with rising procalcitonin.  At risk for aspiration, might have early aspiration pneumonia although chest x-ray not revealing, switch antibiotics to Unasyn on 02/03/2022, seen by speech currently n.p.o. except  medications, keep head of the bed elevated, aspiration precautions.  Overall getting progressively weak due to underlying dementia and deconditioning, weak neck and throat muscles, risk for ongoing aspiration even with oral secretions is very high.  Explained to the family in detail.  Frequent hiccups.  Improved with Thorazine.  KUB stable.  Dehydration, hypernatremia and AKI.  Hydrated with IV fluids waiver due to lack of intravascular protein he is third spacing and developing pulmonary  edema frequently, explained to the family, trial of IV albumin and fluids.  If pulmonary edema develops again and stop fluids and try Lasix.   Acute metabolic encephalopathy in a patient with underlying dementia, extremely frail and deconditioned with advanced age - Secondary to sepsis.  He is pretty deconditioned and frail at baseline, according to the outpatient notes he stopped taking his dementia medications 4 to 5 months ago, had poor appetite with weight loss ongoing for several weeks.  Was becoming increasingly withdrawn, not eating or drinking at least 7 -10 days prior to hospital admission and required assistance in ambulation at least 4 to 5 days prior to hospital admission.  Overall extremely frail, very high risk for repeated aspiration, poor long-term prognosis.  Continue supportive care.  DNR now.  Hypothyroidism - Not currently on levothyroxine, stable TSH  Dementia without behavioral disturbance - Hold mirtazapine due to lethargy, delirium precautions, minimize narcotics and benzodiazepines.  Mild shortness of breath on 02/01/2022.  Was nonspecific, question of mild fluid overload and nonspecific CHF, no echo on file, obtain echocardiogram, fluids reduced, symptoms resolved chest x-ray stable continue to monitor.       Condition -  Guarded  Family Communication  : Daughter and wife in detail on 02/01/2022, wife at bedside on 02/02/2022, daughter and wife bedside 02/03/2022 , detailed discussion  bedside with daughter, wife and niece on 02/04/2022.  Now DNR.  Continue medical treatment but no excessive heroics.  Updated wife and daughter 02/05/2022 bedside  Code Status : DNR  Consults  : Palliative care for goals of care  PUD Prophylaxis :    Procedures  :     TTE - 1. Left ventricular ejection fraction, by estimation, is 70 to 75%. The left ventricle has hyperdynamic function. The left ventricle has no regional wall motion abnormalities. Left ventricular diastolic parameters are indeterminate.  2. Right ventricular systolic function is normal. The right ventricular size is normal. Tricuspid regurgitation signal is inadequate for assessing PA pressure.  3. The mitral valve is normal in structure. No evidence of mitral valve regurgitation. No evidence of mitral stenosis.  4. The aortic valve was not well visualized. Aortic valve regurgitation is not visualized. No aortic stenosis is present.  5. The inferior vena cava is normal in size with <50% respiratory variability, suggesting right atrial pressure of 8 mmHg.  CT head.  Non acute  CT abdomen pelvis -  Unremarkable CT abdomen/pelvis.      Disposition Plan  :    Status is: Inpatient  DVT Prophylaxis  :    enoxaparin (LOVENOX) injection 40 mg Start: 02/03/22 1000    Lab Results  Component Value Date   PLT 469 (H) 02/05/2022    Diet :  Diet Order             Diet NPO time specified Except for: Sips with Meds  Diet effective now                    Inpatient Medications  Scheduled Meds:  aspirin EC  81 mg Oral Daily   Chlorhexidine Gluconate Cloth  6 each Topical Daily   cyanocobalamin  1,000 mcg Oral Daily   diclofenac Sodium  2 g Topical QID   docusate sodium  200 mg Oral BID   enoxaparin (LOVENOX) injection  40 mg Subcutaneous Daily   latanoprost  1 drop Both Eyes QHS   metoprolol tartrate  25 mg Oral BID   midodrine  10 mg Oral  TID   omega-3 acid ethyl esters  1 g Oral Daily   polyethylene glycol  17 g  Oral BID   scopolamine  1 patch Transdermal Q72H   tamsulosin  0.4 mg Oral Daily   Continuous Infusions:  albumin human     ampicillin-sulbactam (UNASYN) IV 200 mL/hr at 02/05/22 0335   chlorproMAZINE (THORAZINE) 12.5 mg in sodium chloride 0.9 % 25 mL IVPB     dextrose 5 % and 0.45% NaCl     PRN Meds:.acetaminophen **OR** acetaminophen, chlorproMAZINE (THORAZINE) 12.5 mg in sodium chloride 0.9 % 25 mL IVPB, ipratropium-albuterol, ondansetron **OR** ondansetron (ZOFRAN) IV    Objective:   Vitals:   02/04/22 1936 02/04/22 2320 02/05/22 0331 02/05/22 0819  BP: 102/76 (!) 84/70 (!) 85/52 91/66  Pulse: (!) 110 (!) 113 88 85  Resp: '20 19 19 18  '$ Temp: 98.3 F (36.8 C) 97.9 F (36.6 C) 98.4 F (36.9 C) 97.7 F (36.5 C)  TempSrc: Oral Oral Axillary Axillary  SpO2: 100%  100%   Weight:      Height:        Wt Readings from Last 3 Encounters:  02/01/22 65.8 kg  01/27/22 65.8 kg  09/24/21 69.3 kg     Intake/Output Summary (Last 24 hours) at 02/05/2022 0836 Last data filed at 02/05/2022 0335 Gross per 24 hour  Intake 489.65 ml  Output 350 ml  Net 139.65 ml     Physical Exam  Sleepy but arousable , oriented x1, no focal deficits, elderly frail African-American gentleman, appears extremely weak [place].AT,PERRAL Supple Neck, No JVD,   Symmetrical Chest wall movement, Good air movement bilaterally, CTAB RRR,No Gallops, Rubs or new Murmurs,  +ve B.Sounds, Abd Soft, No tenderness,   No Cyanosis, Clubbing or edema     Data Review:    CBC Recent Labs  Lab 02/17/2022 2123 02/01/22 0230 02/02/22 0447 02/03/22 0442 02/04/22 0646 02/05/22 0515  WBC 19.2* 20.6* 19.8* 22.0* 25.3* 25.4*  HGB 14.7 13.2 12.3* 11.7* 11.8* 10.8*  HCT 43.2 41.6 35.8* 33.7* 32.9* 31.1*  PLT 461* 489* 424* 438* 468* 469*  MCV 89.6 92.4 88.0 87.5 85.5 86.9  MCH 30.5 29.3 30.2 30.4 30.6 30.2  MCHC 34.0 31.7 34.4 34.7 35.9 34.7  RDW 13.8 13.9 13.9 14.2 14.0 14.1  LYMPHSABS 0.9  --  1.1 1.3 1.0 1.1   MONOABS 2.0*  --  2.1* 2.2* 2.1* 1.9*  EOSABS 0.1  --  0.4 0.4 0.5 0.3  BASOSABS 0.0  --  0.1 0.1 0.1 0.1    Electrolytes Recent Labs  Lab 02/01/22 0230 02/02/22 0447 02/03/22 0135 02/03/22 0442 02/03/22 0703 02/04/22 0646 02/05/22 0515  NA 147* 141 134* 134*  --  134* 139  K 4.1 3.3* 3.6 3.6  --  3.7 3.6  CL 109 105 100 99  --  100 101  CO2 '27 24 22 22  '$ --  24 26  GLUCOSE 116* 100* 109* 99  --  132* 99  BUN 49* 38* 34* 34*  --  33* 43*  CREATININE 1.83* 1.47* 1.38* 1.34*  --  1.36* 1.78*  CALCIUM 9.3 8.6* 8.2* 8.3*  --  8.2* 8.1*  MG  --  2.2  --  2.2  --  2.2 2.3  CRP  --   --   --   --  35.1* 41.5* 37.0*  PROCALCITON  --   --   --   --  17.48 4.70 6.42  TSH 1.216  --   --   --   --   --   --  BNP  --  195.6*  --  215.1*  --  253.6* 278.6*      Radiology Reports DG Chest Port 1 View  Result Date: 02/05/2022 CLINICAL DATA:  88 year old male with shortness of breath. EXAM: PORTABLE CHEST 1 VIEW COMPARISON:  Portable chest 02/04/2022 and earlier. FINDINGS: Portable AP semi upright view at 0730 hours. Mildly rotated to the right. Lung volumes and mediastinal contours are stable. Lung markings appear stable. Allowing for portable technique the lungs are clear. No pneumothorax. Negative visible bowel gas. No acute osseous abnormality identified. IMPRESSION: No acute cardiopulmonary abnormality. Electronically Signed   By: Genevie Ann M.D.   On: 02/05/2022 07:44   DG Chest Port 1 View  Result Date: 02/04/2022 CLINICAL DATA:  Provided history: Shortness of breath, nausea. EXAM: PORTABLE CHEST 1 VIEW COMPARISON:  Prior chest radiographs 02/03/2022 and earlier. FINDINGS: Somewhat shallow inspiration radiograph. The cardiomediastinal silhouette is unchanged. Minimal ill-defined opacity within the medial lung bases, bilaterally, likely reflecting atelectasis. No evidence of pleural effusion or pneumothorax. No acute bony abnormality identified. Degenerative changes of the spine.  IMPRESSION: Shallow inspiration radiograph. Minimal ill-defined opacity within the medial lung bases, bilaterally, likely reflecting atelectasis. Electronically Signed   By: Kellie Simmering D.O.   On: 02/04/2022 08:34   EEG adult  Result Date: 02/03/2022 Lora Havens, MD     02/03/2022  8:20 PM Patient Name: Tommy Browning. MRN: [id number] Epilepsy Attending: Lora Havens Referring Physician/Provider: Thurnell Lose, MD Date: 02/03/2022 Duration: 23.17 mins Patient history: 88yo M with ams. EEG to evaluate for seizure Level of alertness: Awake, asleep AEDs during EEG study: None Technical aspects: This EEG study was done with scalp electrodes positioned according to the 10-20 International system of electrode placement. Electrical activity was reviewed with band pass filter of 1-'70Hz'$ , sensitivity of 7 uV/mm, display speed of 7m/sec with a '60Hz'$  notched filter applied as appropriate. EEG data were recorded continuously and digitally stored.  Video monitoring was available and reviewed as appropriate. Description: The posterior dominant rhythm consists of 8 Hz activity of moderate voltage (25-35 uV) seen predominantly in posterior head regions, symmetric and reactive to eye opening and eye closing. Sleep was characterized by vertex waves, sleep spindles (12 to 14 Hz), maximal frontocentral region. EEG showed intermittent generalized 5 to 6 Hz theta slowing. Hyperventilation and photic stimulation were not performed.   ABNORMALITY - Intermittent slow, generalized IMPRESSION: This study is suggestive of mild diffuse encephalopathy, nonspecific etiology. No seizures or epileptiform discharges were seen throughout the recording. PLora Havens  DG Chest Port 1 View  Result Date: 02/03/2022 CLINICAL DATA:  88year old male with altered mental status, nausea, leukocytosis. EXAM: PORTABLE CHEST 1 VIEW COMPARISON:  Recent CT Abdomen and Pelvis 02/01/2022. Portable chest x-ray yesterday. FINDINGS: Portable AP  semi upright view at 0719 hours. Stable low lung volumes. Stable mediastinal contours, no cardiomegaly. Allowing for portable technique the lungs are clear. Visualized tracheal air column is within normal limits. Stable visualized osseous structures. IMPRESSION: Stable low lung volumes. No acute cardiopulmonary abnormality. Electronically Signed   By: HGenevie AnnM.D.   On: 02/03/2022 07:35   DG Abd Portable 1V  Result Date: 02/03/2022 CLINICAL DATA:  88year old male with altered mental status, nausea, leukocytosis. EXAM: PORTABLE ABDOMEN - 1 VIEW COMPARISON:  Recent CT Abdomen and Pelvis 02/01/2022. FINDINGS: Portable AP view at 0716 hours. Bowel gas pattern is similar and remains non obstructed. Visible abdominal and pelvic visceral contours appear stable. Pelvic phleboliths.  Stable visualized osseous structures. IMPRESSION: Bowel-gas pattern remains non obstructed, no acute radiographic finding. Electronically Signed   By: Genevie Ann M.D.   On: 02/03/2022 07:34   ECHOCARDIOGRAM COMPLETE  Result Date: 02/02/2022    ECHOCARDIOGRAM REPORT   Patient Name:   Tommy Browning. Date of Exam: 02/02/2022 Medical Rec #:  [id number]       Height:       69.0 in Accession #:    [id number]      Weight:       145.0 lb Date of Birth:  1929-06-10       BSA:          1.802 m Patient Age:    71 years        BP:           95/66 mmHg Patient Gender: M               HR:           108 bpm. Exam Location:  Inpatient Procedure: 2D Echo, Cardiac Doppler, Color Doppler and Intracardiac            Opacification Agent Indications:    CHF- Acute Diastolic O24.23  History:        Patient has no prior history of Echocardiogram examinations.                 Abnormal ECG; Risk Factors:Dyslipidemia.  Sonographer:    Ronny Flurry Referring Phys: Graylin Shiver [facility]  Sonographer Comments: No parasternal window, suboptimal apical window and Technically difficult study due to poor echo windows. Image acquisition challenging due to patient body  habitus and Image acquisition challenging due to respiratory motion. IMPRESSIONS  1. Left ventricular ejection fraction, by estimation, is 70 to 75%. The left ventricle has hyperdynamic function. The left ventricle has no regional wall motion abnormalities. Left ventricular diastolic parameters are indeterminate.  2. Right ventricular systolic function is normal. The right ventricular size is normal. Tricuspid regurgitation signal is inadequate for assessing PA pressure.  3. The mitral valve is normal in structure. No evidence of mitral valve regurgitation. No evidence of mitral stenosis.  4. The aortic valve was not well visualized. Aortic valve regurgitation is not visualized. No aortic stenosis is present.  5. The inferior vena cava is normal in size with <50% respiratory variability, suggesting right atrial pressure of 8 mmHg. FINDINGS  Left Ventricle: Left ventricular ejection fraction, by estimation, is 70 to 75%. The left ventricle has hyperdynamic function. The left ventricle has no regional wall motion abnormalities. Definity contrast agent was given IV to delineate the left ventricular endocardial borders. The left ventricular internal cavity size was small. There is no left ventricular hypertrophy. Left ventricular diastolic parameters are indeterminate. Right Ventricle: The right ventricular size is normal. No increase in right ventricular wall thickness. Right ventricular systolic function is normal. Tricuspid regurgitation signal is inadequate for assessing PA pressure. Left Atrium: Left atrial size was normal in size. Right Atrium: Right atrial size was normal in size. Pericardium: Trivial pericardial effusion is present. Mitral Valve: The mitral valve is normal in structure. No evidence of mitral valve regurgitation. No evidence of mitral valve stenosis. Tricuspid Valve: The tricuspid valve is normal in structure. Tricuspid valve regurgitation is trivial. Aortic Valve: The aortic valve was not well  visualized. Aortic valve regurgitation is not visualized. No aortic stenosis is present. Aortic valve mean gradient measures 6.0 mmHg. Aortic valve peak gradient measures 10.5 mmHg. Pulmonic Valve:  The pulmonic valve was not well visualized. Pulmonic valve regurgitation is not visualized. Aorta: The aortic root was not well visualized. Venous: The inferior vena cava is normal in size with less than 50% respiratory variability, suggesting right atrial pressure of 8 mmHg. IAS/Shunts: The interatrial septum was not well visualized.   LV Volumes (MOD) LV vol d, MOD A2C: 48.5 ml Diastology LV vol d, MOD A4C: 82.9 ml LV e' medial:    6.20 cm/s LV vol s, MOD A2C: 13.4 ml LV E/e' medial:  11.8 LV vol s, MOD A4C: 16.6 ml LV e' lateral:   7.62 cm/s LV SV MOD A2C:     35.1 ml LV E/e' lateral: 9.6 LV SV MOD A4C:     82.9 ml LV SV MOD BP:      49.4 ml RIGHT VENTRICLE            IVC RV Basal diam:  2.30 cm    IVC diam: 1.60 cm RV S prime:     9.95 cm/s TAPSE (M-mode): 1.2 cm LEFT ATRIUM           Index        RIGHT ATRIUM          Index LA Vol (A2C): 21.0 ml 11.65 ml/m  RA Area:     9.94 cm LA Vol (A4C): 15.4 ml 8.55 ml/m   RA Volume:   17.90 ml 9.93 ml/m  AORTIC VALVE AV Vmax:           162.00 cm/s AV Vmean:          111.000 cm/s AV VTI:            0.240 m AV Peak Grad:      10.5 mmHg AV Mean Grad:      6.0 mmHg LVOT Vmax:         135.67 cm/s LVOT Vmean:        91.633 cm/s LVOT VTI:          0.195 m LVOT/AV VTI ratio: 0.81 MITRAL VALVE MV Area (PHT): 4.39 cm    SHUNTS MV Decel Time: 173 msec    Systemic VTI: 0.19 m MR Peak grad: 8.3 mmHg MR Vmax:      144.00 cm/s MV E velocity: 73.20 cm/s MV A velocity: 86.40 cm/s MV E/A ratio:  0.85 Oswaldo Milian MD Electronically signed by Oswaldo Milian MD Signature Date/Time: 02/02/2022/3:07:23 PM    Final    DG Chest Port 1 View  Result Date: 02/02/2022 CLINICAL DATA:  88 year old male with history of shortness of breath. EXAM: PORTABLE CHEST 1 VIEW COMPARISON:  Chest  x-ray 02/01/2022. FINDINGS: Lung volumes are normal. No consolidative airspace disease. No pleural effusions. No pneumothorax. No pulmonary nodule or mass noted. Pulmonary vasculature and the cardiomediastinal silhouette are within normal limits. IMPRESSION: No radiographic evidence of acute cardiopulmonary disease. Electronically Signed   By: Vinnie Langton M.D.   On: 02/02/2022 07:09   DG Chest Port 1 View  Result Date: 02/01/2022 CLINICAL DATA:  Shortness of breath EXAM: PORTABLE CHEST 1 VIEW COMPARISON:  02/16/2022 FINDINGS: Lungs are clear.  No pleural effusion or pneumothorax. The heart is normal in size. IMPRESSION: No evidence of acute cardiopulmonary disease. Electronically Signed   By: Julian Hy M.D.   On: 02/01/2022 20:03      Signature  Lala Lund M.D on 02/05/2022 at 8:36 AM   -  To page go to [email or web address]

## 2022-02-05 NOTE — Progress Notes (Signed)
Daily Progress Note   Patient Name: Tommy Browning.       Date: 02/05/2022 DOB: 12-19-1929  Age: 88 y.o. MRN#: [id number] Attending Physician: Thurnell Lose, MD Primary Care Physician: Hoyt Koch, MD Admit Date: 02/08/2022  Reason for Consultation/Follow-up: Establishing goals of care  Patient Profile/HPI:   88 y.o. male  with past medical history of mild dementia, hypothyroid, BPH admitted on 02/20/2022 with sepsis due to UTI likely related to urinary retention from BPH.  He has a Foley in place.  He has had some transient shortness of breath with given dose of Lasix.  Chest x-ray was clear.  Echocardiogram is pending.  Palliative medicine consulted for 88 yr old, frail with some dementia, Uro sepsis, GOC.    Subjective: Chart reviewed including labs, progress notes, imaging from this and previous encounters.  Spouse and daughter are at bedside.  On my eval- he was working with occupational therapy. Sitting up in chair. Per report he was able to stand for one minute. Able to adjust himself in the chair. Still very weak. Not requiring supplemental oxygen even with movement.  Oriented to person and place.  He was nauseated and dry heaving. Complaining of pain in his neck. Also with complaints of nasal congestion.  Family shares he was awake and alert last evening, conversing a great deal with them. He is currently more awake than yesterday, but remains lethargic on my eval.   Review of Systems  Unable to perform ROS: Mental status change     Physical Exam Vitals and nursing note reviewed.  Constitutional:      Comments: Lethargic, frail  Cardiovascular:     Rate and Rhythm: Normal rate.  Pulmonary:     Effort: Pulmonary effort is normal.  Neurological:     Motor: Weakness  present.     Comments: Oriented to person and place             Vital Signs: BP 112/76   Pulse 96   Temp 97.7 F (36.5 C) (Axillary)   Resp 18   Ht '5\' 9"'$  (1.753 m)   Wt 65.8 kg   SpO2 100%   BMI 21.41 kg/m  SpO2: SpO2: 100 % O2 Device: O2 Device: Nasal Cannula O2 Flow Rate: O2 Flow Rate (L/min): 2 L/min  Intake/output summary:  Intake/Output Summary (Last 24 hours) at 02/05/2022 1242 Last data filed at 02/05/2022 0335 Gross per 24 hour  Intake 489.65 ml  Output 350 ml  Net 139.65 ml    LBM: Last BM Date :  (unsure of last BM) Baseline Weight: Weight: 68 kg Most recent weight: Weight: 65.8 kg       Palliative Assessment/Data: PPS: 30%      Patient Active Problem List   Diagnosis Date Noted   Acute urinary retention 02/21/2022   Sepsis secondary to UTI ([place])    Dementia without behavioral disturbance ([place]) 27/74/1287   Acute metabolic encephalopathy 86/76/7209   Hypothyroidism 02/10/2022   AKI (acute kidney injury) ([place]) 02/08/2022   Nausea 05/31/2020   Memory disorder 05/01/2016   Loss of weight 07/23/2015   Graves' eye disease 04/16/2011   Routine general medical examination at a health care facility 01/27/2011   HYPOGONADISM 02/03/2010   Hyperlipidemia 05/30/2009   BPH (benign prostatic hyperplasia) 02/26/2009   ELECTROCARDIOGRAM, ABNORMAL 12/08/2006    Palliative Care Assessment & Plan    Assessment/Recommendations/Plan  Continue current efforts to treat what is treatable- family feels they are seeing improvements in his mental status PMT will continue to monitor patient's trajectory and assist with GOC Neck pain- reportedly d/t position in bed per occupational therapy- tylenol and heating pad prn Saline spray for nasal congestion Based on his previous functional status he would not be eligible for hospice on his dementia diagnosis alone- however, this may be a new baseline and if so then he would definitely meet hospice eligibility - GOC  continue to be towards working for improvements, but family understands patient is critically ill and may be facing end of life  Code Status: DNR  Prognosis:  Unable to determine  Discharge Planning: To Be Determined  Care plan was discussed with family   Thank you for allowing the Palliative Medicine Team to assist in the care of this patient.  Greater than 50%  of this time was spent counseling and coordinating care related to the above assessment and plan.  Mariana Kaufman, AGNP-C Palliative Medicine   Please contact Palliative Medicine Team phone at [phone] for questions and concerns.

## 2022-02-05 NOTE — Progress Notes (Signed)
Pharmacy Antibiotic Note  Tommy Browning. is a 88 y.o. male on day #6 antibiotics, initially for sepsis/UTI coverage. Currently on day #3 Unasyn for aspiration pneumonia.    Creatinine has fluctuated, trended up today.  Plan: Unasyn 3 gm IV q6h > q12h for current renal function. Will follow up renal function, culture data, clinical progress and antibiotic plans.  Height: '5\' 9"'$  (175.3 cm) Weight: 65.8 kg (145 lb) IBW/kg (Calculated) : 70.7  Temp (24hrs), Avg:98.1 F (36.7 C), Min:97.7 F (36.5 C), Max:98.4 F (36.9 C)  Recent Labs  Lab 02/01/22 0230 02/02/22 0447 02/03/22 0135 02/03/22 0442 02/04/22 0646 02/05/22 0515  WBC 20.6* 19.8*  --  22.0* 25.3* 25.4*  CREATININE 1.83* 1.47* 1.38* 1.34* 1.36* 1.78*    Estimated Creatinine Clearance: 25.7 mL/min (A) (by C-G formula based on SCr of 1.78 mg/dL (H)).    No Known Allergies  Antimicrobials this admission: Vancomycin x 1 on 11/4 Cefepime 11/4 x1 Ceftriaxone 11/5 >> 11/6 Unasyn 11/7 >>   Dose adjustments this admission: 11/9: Unasyn 3 gm IV q6h > q12h for increased creatinine  Microbiology results: 11/4 blood: no growth x 4 days to date 11/7 blood: no growth x 2 days to date 11/7 MRSA PCR: neg  Thank you for allowing pharmacy to be a part of this patient's care.  Arty Baumgartner, [place] 02/05/2022 1:02 PM

## 2022-02-06 ENCOUNTER — Inpatient Hospital Stay (HOSPITAL_COMMUNITY): Payer: Medicare PPO

## 2022-02-06 DIAGNOSIS — Z515 Encounter for palliative care: Secondary | ICD-10-CM

## 2022-02-06 DIAGNOSIS — A419 Sepsis, unspecified organism: Secondary | ICD-10-CM | POA: Diagnosis not present

## 2022-02-06 DIAGNOSIS — N390 Urinary tract infection, site not specified: Secondary | ICD-10-CM | POA: Diagnosis not present

## 2022-02-06 LAB — CULTURE, BLOOD (ROUTINE X 2)
Culture: NO GROWTH
Culture: NO GROWTH

## 2022-02-06 LAB — CBC WITH DIFFERENTIAL/PLATELET
Abs Immature Granulocytes: 0.68 10*3/uL — ABNORMAL HIGH (ref 0.00–0.07)
Basophils Absolute: 0.1 10*3/uL (ref 0.0–0.1)
Basophils Relative: 0 %
Eosinophils Absolute: 0.5 10*3/uL (ref 0.0–0.5)
Eosinophils Relative: 2 %
HCT: 25.8 % — ABNORMAL LOW (ref 39.0–52.0)
Hemoglobin: 8.5 g/dL — ABNORMAL LOW (ref 13.0–17.0)
Immature Granulocytes: 3 %
Lymphocytes Relative: 4 %
Lymphs Abs: 0.8 10*3/uL (ref 0.7–4.0)
MCH: 29.7 pg (ref 26.0–34.0)
MCHC: 32.9 g/dL (ref 30.0–36.0)
MCV: 90.2 fL (ref 80.0–100.0)
Monocytes Absolute: 1.5 10*3/uL — ABNORMAL HIGH (ref 0.1–1.0)
Monocytes Relative: 7 %
Neutro Abs: 18.3 10*3/uL — ABNORMAL HIGH (ref 1.7–7.7)
Neutrophils Relative %: 84 %
Platelets: 408 10*3/uL — ABNORMAL HIGH (ref 150–400)
RBC: 2.86 MIL/uL — ABNORMAL LOW (ref 4.22–5.81)
RDW: 14.2 % (ref 11.5–15.5)
WBC: 21.8 10*3/uL — ABNORMAL HIGH (ref 4.0–10.5)
nRBC: 0 % (ref 0.0–0.2)

## 2022-02-06 LAB — BASIC METABOLIC PANEL
Anion gap: 13 (ref 5–15)
BUN: 53 mg/dL — ABNORMAL HIGH (ref 8–23)
CO2: 23 mmol/L (ref 22–32)
Calcium: 8.4 mg/dL — ABNORMAL LOW (ref 8.9–10.3)
Chloride: 100 mmol/L (ref 98–111)
Creatinine, Ser: 1.93 mg/dL — ABNORMAL HIGH (ref 0.61–1.24)
GFR, Estimated: 32 mL/min — ABNORMAL LOW (ref >60)
Glucose, Bld: 159 mg/dL — ABNORMAL HIGH (ref 70–99)
Potassium: 3 mmol/L — ABNORMAL LOW (ref 3.5–5.1)
Sodium: 136 mmol/L (ref 135–145)

## 2022-02-06 LAB — PROCALCITONIN: Procalcitonin: 6 ng/mL

## 2022-02-06 LAB — C-REACTIVE PROTEIN: CRP: 25.2 mg/dL — ABNORMAL HIGH (ref <1.0)

## 2022-02-06 MED ORDER — DEXTROSE-NACL 5-0.45 % IV SOLN: INTRAVENOUS | Status: DC

## 2022-02-06 MED ORDER — POTASSIUM CHLORIDE 10 MEQ/100ML IV SOLN
10.0000 meq | INTRAVENOUS | Status: AC
  Administered 2022-02-06 (×4): 10 meq via INTRAVENOUS
  Filled 2022-02-06 (×4): qty 100

## 2022-02-06 MED ORDER — ALBUMIN HUMAN 25 % IV SOLN
50.0000 g | Freq: Four times a day (QID) | INTRAVENOUS | Status: AC
  Administered 2022-02-06 (×3): 50 g via INTRAVENOUS
  Filled 2022-02-06 (×3): qty 200

## 2022-02-06 MED ORDER — ACETAMINOPHEN 500 MG PO TABS
500.0000 mg | ORAL_TABLET | Freq: Three times a day (TID) | ORAL | Status: DC
  Administered 2022-02-06 (×2): 500 mg via ORAL
  Filled 2022-02-06 (×2): qty 1

## 2022-02-06 MED ORDER — POTASSIUM CHLORIDE 20 MEQ PO PACK
40.0000 meq | PACK | Freq: Two times a day (BID) | ORAL | Status: DC
  Administered 2022-02-06: 40 meq via ORAL
  Filled 2022-02-06: qty 2

## 2022-02-06 NOTE — Progress Notes (Signed)
Speech Language Pathology Treatment: Dysphagia  Patient Details Name: Tommy Browning. MRN: [id number] DOB: November 09, 1929 Today's Date: 02/06/2022 Time: 1245-8099 SLP Time Calculation (min) (ACUTE ONLY): 16 min  Assessment / Plan / Recommendation Clinical Impression  Pt was seen for dysphagia treatment with his family present. He was alert and cooperative during the session, but reported some nausea. Per RN and family, there have been no episodes of emesis. Pt passed the [place] twice with thin liquids via straw. Multiple swallows and delayed coughs were noted with full tsp boluses of purees and with regular texture solids. SLP questions possible aspiration of pharyngeal residue, but it has also been reported by RN and family that pt has been coughing frequently at baseline and "can't get the phlegm up". A full liquid diet will be initiated at this time and SLP will follow pt to ensure tolerance and for advancement as clinically indicated.    HPI HPI: 88 y.o. male presented from home to ED with sepsis secondary to UTI, AKI, acute metabolic encephalopathy. PMHx of dementia, hypothyroidism, BPH.      SLP Plan  Continue with current plan of care      Recommendations for follow up therapy are one component of a multi-disciplinary discharge planning process, led by the attending physician.  Recommendations may be updated based on patient status, additional functional criteria and insurance authorization.    Recommendations  Diet recommendations: Thin liquid (full liquids) Liquids provided via: Cup;Straw Medication Administration: Crushed with puree Supervision: Staff to assist with self feeding;Full supervision/cueing for compensatory strategies Compensations: Small sips/bites;Slow rate Postural Changes and/or Swallow Maneuvers: Seated upright 90 degrees                Oral Care Recommendations: Oral care QID;Oral care prior to ice chip/H20 Follow Up Recommendations: Skilled nursing-short  term rehab (<3 hours/day) Assistance recommended at discharge: Frequent or constant Supervision/Assistance SLP Visit Diagnosis: Dysphagia, unspecified (R13.10) Plan: Continue with current plan of care          Tommy Browning I. Tommy Browning, Tommy Browning, Tommy Browning Office number [phone]  Tommy Browning  02/06/2022, 11:36 AM

## 2022-02-06 NOTE — Progress Notes (Addendum)
Physical Therapy Treatment Patient Details Name: Tommy Browning. MRN: [id number] DOB: March 11, 1930 Today's Date: 02/06/2022   History of Present Illness Pt is a 88 yo male admitted with weakness, lethargy, not eating and dark urine. Pt founc to have UTI and toxic encephalopathy as well as sepsis. PMH: BPH, hypothyroid, dementia.    PT Comments    Pt remains very weak and requiring assist for all mobility. Pt returned to bed after treatment due to [place] and IV team waiting to see pt. Continue to recommend SNF. If family chooses to take pt home will need maximum HH support.    Recommendations for follow up therapy are one component of a multi-disciplinary discharge planning process, led by the attending physician.  Recommendations may be updated based on patient status, additional functional criteria and insurance authorization.  Follow Up Recommendations  Skilled nursing-short term rehab (<3 hours/day) Can patient physically be transported by private vehicle: No   Assistance Recommended at Discharge Frequent or constant Supervision/Assistance  Patient can return home with the following A lot of help with walking and/or transfers;A lot of help with bathing/dressing/bathroom;Assist for transportation;Help with stairs or ramp for entrance   Equipment Recommendations  Rolling walker (2 wheels);Wheelchair (measurements PT);Wheelchair cushion (measurements PT);Hospital bed    Recommendations for Other Services       Precautions / Restrictions Precautions Precautions: Fall Precaution Comments: monitor HR with activity. Restrictions Weight Bearing Restrictions: No     Mobility  Bed Mobility Overal bed mobility: Needs Assistance Bed Mobility: Supine to Sit, Sit to Sidelying     Supine to sit: Mod assist   Sit to sidelying: Mod assist General bed mobility comments: Assist to bring legs off of bed, elevate trunk into sitting and bring hips to EOB. Assist to bring legs back up in bed  returning to sidelying    Transfers Overall transfer level: Needs assistance Equipment used: Rolling walker (2 wheels) Transfers: Sit to/from Stand Sit to Stand: Mod assist, +2 physical assistance, +2 safety/equipment           General transfer comment: Assist to bring hips up and for balance. Pt required 2 person assist on first attempt and progressed to 1 person mod assist with 2nd person for safety. Needed verbal/tactile cues every time for placement. Also needed verbal/tactile cues to fully extend hips.    Ambulation/Gait             Pre-gait activities: Stood 4 times for 30-60 secs working on trunk and hip extension. Side stepped up side of bed twice for a total of 3'.     Stairs             Wheelchair Mobility    Modified Rankin (Stroke Patients Only)       Balance Overall balance assessment: Needs assistance Sitting-balance support: Feet supported Sitting balance-Leahy Scale: Fair     Standing balance support: During functional activity, Bilateral upper extremity supported Standing balance-Leahy Scale: Poor Standing balance comment: walker and min to mod assist for static standing                            Cognition Arousal/Alertness: Awake/alert Behavior During Therapy: Flat affect Overall Cognitive Status: History of cognitive impairments - at baseline Area of Impairment: Attention, Memory, Safety/judgement, Awareness, Problem solving                   Current Attention Level: Sustained Memory: Decreased short-term memory  Safety/Judgement: Decreased awareness of safety, Decreased awareness of deficits Awareness: Intellectual Problem Solving: Slow processing, Decreased initiation, Requires verbal cues, Difficulty sequencing, Requires tactile cues General Comments: Pt has baseline demential but this current state is worse than normal.        Exercises      General Comments General comments (skin integrity, edema,  etc.): Pt with head rotated to rt and side flexion. Worked on positioning head in neutral and performed soft tissue work to bilateral trapezius      Pertinent Vitals/Pain Pain Assessment Pain Assessment: Faces Faces Pain Scale: Hurts even more Pain Location: Neck Pain Descriptors / Indicators: Discomfort, Grimacing, Guarding Pain Intervention(s): Limited activity within patient's tolerance, Monitored during session, Repositioned, Heat applied, Other (comment) (soft tissue massage)    Home Living                          Prior Function            PT Goals (current goals can now be found in the care plan section) Progress towards PT goals: Not progressing toward goals - comment    Frequency    Min 3X/week      PT Plan Current plan remains appropriate    Co-evaluation              AM-PAC PT "6 Clicks" Mobility   Outcome Measure  Help needed turning from your back to your side while in a flat bed without using bedrails?: A Lot Help needed moving from lying on your back to sitting on the side of a flat bed without using bedrails?: A Lot Help needed moving to and from a bed to a chair (including a wheelchair)?: A Lot Help needed standing up from a chair using your arms (e.g., wheelchair or bedside chair)?: A Lot Help needed to walk in hospital room?: Total Help needed climbing 3-5 steps with a railing? : Total 6 Click Score: 10    End of Session Equipment Utilized During Treatment: Gait belt Activity Tolerance: Patient limited by fatigue Patient left: in bed;with call bell/phone within reach;with bed alarm set;with family/visitor present;with nursing/sitter in room Nurse Communication: Mobility status PT Visit Diagnosis: Unsteadiness on feet (R26.81);Other abnormalities of gait and mobility (R26.89);Muscle weakness (generalized) (M62.81)     Time: 2549-8264 PT Time Calculation (min) (ACUTE ONLY): 32 min  Charges:  $Therapeutic Activity: 23-37  mins                     [place] Office [place] 02/06/2022, 11:26 AM

## 2022-02-06 NOTE — Progress Notes (Addendum)
PROGRESS NOTE                                                                                                                                                                                                             Patient Demographics:    Tommy Browning, is a 88 y.o. male, DOB - [birth year], INO:[id number]  Outpatient Primary MD for the patient is Hoyt Koch, MD    LOS - 6  Admit date - 02/22/2022    Chief Complaint  Patient presents with   Altered Mental Status       Brief Narrative (HPI from H&P)    88 y.o. male with medical history significant for BPH, hypothyroidism and dementia was brought in by EMS for evaluation of a one-week history of weakness and lethargy with decreased oral intake, and frequent urination that was 'very dark to red".  At baseline patient is ambulant able to participate in ADLs but definitely had a sustained decline, he was less active, not eating well and required assistance in ambulation for several days prior to hospital admission, however there is a sustained pattern of deconditioning, decreased appetite and oral intake for several months. The ER he was diagnosed with urinary retention with sepsis, UTI and toxic encephalopathy and admitted.   Subjective:   Weak frail elderly African-American gentleman lying in hospital bed in no distress, neck twisted to the right, denies any headache chest or abdominal pain.   Assessment  & Plan :   Sepsis secondary to UTI caused by urinary retention.  Underlying history of BPH. Foley placed in the ER, on empiric antibiotics, sepsis pathophysiology is improving, add Flomax with outpatient urology follow-up.  Will be discharged with Foley catheter.  Possible aspiration pneumonia.  High white count, low-grade fevers with rising procalcitonin.  At risk for aspiration, might have early aspiration pneumonia although chest x-ray not revealing, switch  antibiotics to Unasyn on 02/03/2022, seen by speech currently n.p.o. except medications, keep head of the bed elevated, aspiration precautions.  Overall getting progressively weak due to underlying dementia and deconditioning, weak neck and throat muscles, risk for ongoing aspiration even with oral secretions is very high.  Explained to the family in detail.  Frequent hiccups.  Improved with Thorazine.  KUB stable.  Dehydration, hypernatremia and AKI.  Hydrated with IV fluids waiver due to lack of  intravascular protein he is third spacing and developing pulmonary edema frequently, explained to the family, trial of IV albumin and fluids.  If pulmonary edema develops again and stop fluids and try Lasix.  Obtain renal ultrasound on 02/06/2022.   Acute metabolic encephalopathy in a patient with underlying dementia, extremely frail and deconditioned with advanced age - Secondary to sepsis.  He is pretty deconditioned and frail at baseline, according to the outpatient notes he stopped taking his dementia medications 4 to 5 months ago, had poor appetite with weight loss ongoing for several weeks.  Was becoming increasingly withdrawn, not eating or drinking at least 7 -10 days prior to hospital admission and required assistance in ambulation at least 4 to 5 days prior to hospital admission.  Overall extremely frail, very high risk for repeated aspiration, poor long-term prognosis.  Continue supportive care.  DNR now.  Hypothyroidism - Not currently on levothyroxine, stable TSH  Dementia without behavioral disturbance - Hold mirtazapine due to lethargy, delirium precautions, minimize narcotics and benzodiazepines.  Mild shortness of breath on 02/01/2022.  Was nonspecific, question of mild fluid overload and nonspecific CHF, no echo on file, nonacute echocardiogram, fluids reduced, symptoms resolved chest x-ray stable continue to monitor.       Condition -  Guarded  Family Communication  : Daughter and wife  in detail on 02/01/2022, wife at bedside on 02/02/2022, daughter and wife bedside 02/03/2022 , detailed discussion bedside with daughter, wife and niece on 02/04/2022.  Now DNR.  Continue medical treatment but no excessive heroics.  Updated wife and daughter 02/05/2022 bedside  Code Status : DNR  Consults  : Palliative care for goals of care  PUD Prophylaxis :    Procedures  :     TTE - 1. Left ventricular ejection fraction, by estimation, is 70 to 75%. The left ventricle has hyperdynamic function. The left ventricle has no regional wall motion abnormalities. Left ventricular diastolic parameters are indeterminate.  2. Right ventricular systolic function is normal. The right ventricular size is normal. Tricuspid regurgitation signal is inadequate for assessing PA pressure.  3. The mitral valve is normal in structure. No evidence of mitral valve regurgitation. No evidence of mitral stenosis.  4. The aortic valve was not well visualized. Aortic valve regurgitation is not visualized. No aortic stenosis is present.  5. The inferior vena cava is normal in size with <50% respiratory variability, suggesting right atrial pressure of 8 mmHg.  CT head.  Non acute  CT abdomen pelvis -  Unremarkable CT abdomen/pelvis.      Disposition Plan  :    Status is: Inpatient  DVT Prophylaxis  :    enoxaparin (LOVENOX) injection 30 mg Start: 02/06/22 1000    Lab Results  Component Value Date   PLT 408 (H) 02/06/2022    Diet :  Diet Order             Diet NPO time specified Except for: Sips with Meds  Diet effective now                    Inpatient Medications  Scheduled Meds:  aspirin EC  81 mg Oral Daily   Chlorhexidine Gluconate Cloth  6 each Topical Daily   cyanocobalamin  1,000 mcg Oral Daily   diclofenac Sodium  2 g Topical QID   docusate sodium  200 mg Oral BID   enoxaparin (LOVENOX) injection  30 mg Subcutaneous Daily   latanoprost  1 drop Both Eyes QHS  metoprolol tartrate  25 mg  Oral BID   midodrine  10 mg Oral TID   omega-3 acid ethyl esters  1 g Oral Daily   polyethylene glycol  17 g Oral BID   potassium chloride  40 mEq Oral BID   scopolamine  1 patch Transdermal Q72H   tamsulosin  0.4 mg Oral Daily   Continuous Infusions:  albumin human     ampicillin-sulbactam (UNASYN) IV 3 g (02/06/22 0930)   chlorproMAZINE (THORAZINE) 12.5 mg in sodium chloride 0.9 % 25 mL IVPB     dextrose 5 % and 0.45% NaCl 50 mL/hr at 02/06/22 0921   PRN Meds:.acetaminophen **OR** acetaminophen, chlorproMAZINE (THORAZINE) 12.5 mg in sodium chloride 0.9 % 25 mL IVPB, ipratropium-albuterol, ondansetron **OR** ondansetron (ZOFRAN) IV, sodium chloride    Objective:   Vitals:   02/05/22 1547 02/05/22 1951 02/06/22 0437 02/06/22 0815  BP: (!) 85/53 101/66 93/61 (!) 94/58  Pulse: 83 86 93 92  Resp: '16 13 20 '$ (!) 25  Temp: 98 F (36.7 C) 98.6 F (37 C) 99 F (37.2 C) 99.9 F (37.7 C)  TempSrc: Axillary Oral Axillary Oral  SpO2:  100% 100%   Weight:      Height:        Wt Readings from Last 3 Encounters:  02/01/22 65.8 kg  01/27/22 65.8 kg  09/24/21 69.3 kg     Intake/Output Summary (Last 24 hours) at 02/06/2022 0945 Last data filed at 02/06/2022 0400 Gross per 24 hour  Intake --  Output 650 ml  Net -650 ml     Physical Exam  Sleepy but arousable , oriented x1, no focal deficits, elderly frail African-American gentleman, appears extremely weak [place].AT,PERRAL Supple Neck, No JVD,   Symmetrical Chest wall movement, Good air movement bilaterally, CTAB RRR,No Gallops, Rubs or new Murmurs,  +ve B.Sounds, Abd Soft, No tenderness,   No Cyanosis, Clubbing or edema     Data Review:    CBC Recent Labs  Lab 02/02/22 0447 02/03/22 0442 02/04/22 0646 02/05/22 0515 02/06/22 0245  WBC 19.8* 22.0* 25.3* 25.4* 21.8*  HGB 12.3* 11.7* 11.8* 10.8* 8.5*  HCT 35.8* 33.7* 32.9* 31.1* 25.8*  PLT 424* 438* 468* 469* 408*  MCV 88.0 87.5 85.5 86.9 90.2  MCH 30.2 30.4 30.6 30.2  29.7  MCHC 34.4 34.7 35.9 34.7 32.9  RDW 13.9 14.2 14.0 14.1 14.2  LYMPHSABS 1.1 1.3 1.0 1.1 0.8  MONOABS 2.1* 2.2* 2.1* 1.9* 1.5*  EOSABS 0.4 0.4 0.5 0.3 0.5  BASOSABS 0.1 0.1 0.1 0.1 0.1    Electrolytes Recent Labs  Lab 02/01/22 0230 02/02/22 0447 02/03/22 0135 02/03/22 0442 02/03/22 0703 02/04/22 0646 02/05/22 0515 02/06/22 0245  NA 147* 141 134* 134*  --  134* 139 136  K 4.1 3.3* 3.6 3.6  --  3.7 3.6 3.0*  CL 109 105 100 99  --  100 101 100  CO2 '27 24 22 22  '$ --  '24 26 23  '$ GLUCOSE 116* 100* 109* 99  --  132* 99 159*  BUN 49* 38* 34* 34*  --  33* 43* 53*  CREATININE 1.83* 1.47* 1.38* 1.34*  --  1.36* 1.78* 1.93*  CALCIUM 9.3 8.6* 8.2* 8.3*  --  8.2* 8.1* 8.4*  MG  --  2.2  --  2.2  --  2.2 2.3  --   CRP  --   --   --   --  35.1* 41.5* 37.0* 25.2*  PROCALCITON  --   --   --   --  17.48 4.70 6.42 6.00  TSH 1.216  --   --   --   --   --   --   --   BNP  --  195.6*  --  215.1*  --  253.6* 278.6*  --       Radiology Reports DG Chest Port 1 View  Result Date: 02/06/2022 CLINICAL DATA:  Shortness of breath. EXAM: PORTABLE CHEST 1 VIEW COMPARISON:  One-view chest x-ray 02/05/2022. FINDINGS: Heart size is normal. Slight increase of interstitial and airspace opacities is noted bilaterally, particularly at the right base. No significant edema or effusions are present. IMPRESSION: Slight increase of interstitial and airspace opacities bilaterally, particularly at the right base. While this may represent atelectasis, infection or aspiration are not excluded. Electronically Signed   By: San Morelle M.D.   On: 02/06/2022 06:38   DG Chest Port 1 View  Result Date: 02/05/2022 CLINICAL DATA:  88 year old male with shortness of breath. EXAM: PORTABLE CHEST 1 VIEW COMPARISON:  Portable chest 02/04/2022 and earlier. FINDINGS: Portable AP semi upright view at 0730 hours. Mildly rotated to the right. Lung volumes and mediastinal contours are stable. Lung markings appear stable.  Allowing for portable technique the lungs are clear. No pneumothorax. Negative visible bowel gas. No acute osseous abnormality identified. IMPRESSION: No acute cardiopulmonary abnormality. Electronically Signed   By: Genevie Ann M.D.   On: 02/05/2022 07:44   DG Chest Port 1 View  Result Date: 02/04/2022 CLINICAL DATA:  Provided history: Shortness of breath, nausea. EXAM: PORTABLE CHEST 1 VIEW COMPARISON:  Prior chest radiographs 02/03/2022 and earlier. FINDINGS: Somewhat shallow inspiration radiograph. The cardiomediastinal silhouette is unchanged. Minimal ill-defined opacity within the medial lung bases, bilaterally, likely reflecting atelectasis. No evidence of pleural effusion or pneumothorax. No acute bony abnormality identified. Degenerative changes of the spine. IMPRESSION: Shallow inspiration radiograph. Minimal ill-defined opacity within the medial lung bases, bilaterally, likely reflecting atelectasis. Electronically Signed   By: Kellie Simmering D.O.   On: 02/04/2022 08:34   EEG adult  Result Date: 02/03/2022 Lora Havens, MD     02/03/2022  8:20 PM Patient Name: Tommy Browning. MRN: [id number] Epilepsy Attending: Lora Havens Referring Physician/Provider: Thurnell Lose, MD Date: 02/03/2022 Duration: 23.17 mins Patient history: 88yo M with ams. EEG to evaluate for seizure Level of alertness: Awake, asleep AEDs during EEG study: None Technical aspects: This EEG study was done with scalp electrodes positioned according to the 10-20 International system of electrode placement. Electrical activity was reviewed with band pass filter of 1-'70Hz'$ , sensitivity of 7 uV/mm, display speed of 70m/sec with a '60Hz'$  notched filter applied as appropriate. EEG data were recorded continuously and digitally stored.  Video monitoring was available and reviewed as appropriate. Description: The posterior dominant rhythm consists of 8 Hz activity of moderate voltage (25-35 uV) seen predominantly in posterior head  regions, symmetric and reactive to eye opening and eye closing. Sleep was characterized by vertex waves, sleep spindles (12 to 14 Hz), maximal frontocentral region. EEG showed intermittent generalized 5 to 6 Hz theta slowing. Hyperventilation and photic stimulation were not performed.   ABNORMALITY - Intermittent slow, generalized IMPRESSION: This study is suggestive of mild diffuse encephalopathy, nonspecific etiology. No seizures or epileptiform discharges were seen throughout the recording. PLora Havens  DG Chest Port 1 View  Result Date: 02/03/2022 CLINICAL DATA:  88year old male with altered mental status, nausea, leukocytosis. EXAM: PORTABLE CHEST 1 VIEW COMPARISON:  Recent CT Abdomen and Pelvis 02/01/2022.  Portable chest x-ray yesterday. FINDINGS: Portable AP semi upright view at 0719 hours. Stable low lung volumes. Stable mediastinal contours, no cardiomegaly. Allowing for portable technique the lungs are clear. Visualized tracheal air column is within normal limits. Stable visualized osseous structures. IMPRESSION: Stable low lung volumes. No acute cardiopulmonary abnormality. Electronically Signed   By: Genevie Ann M.D.   On: 02/03/2022 07:35   DG Abd Portable 1V  Result Date: 02/03/2022 CLINICAL DATA:  88 year old male with altered mental status, nausea, leukocytosis. EXAM: PORTABLE ABDOMEN - 1 VIEW COMPARISON:  Recent CT Abdomen and Pelvis 02/01/2022. FINDINGS: Portable AP view at 0716 hours. Bowel gas pattern is similar and remains non obstructed. Visible abdominal and pelvic visceral contours appear stable. Pelvic phleboliths. Stable visualized osseous structures. IMPRESSION: Bowel-gas pattern remains non obstructed, no acute radiographic finding. Electronically Signed   By: Genevie Ann M.D.   On: 02/03/2022 07:34   ECHOCARDIOGRAM COMPLETE  Result Date: 02/02/2022    ECHOCARDIOGRAM REPORT   Patient Name:   Tommy Browning. Date of Exam: 02/02/2022 Medical Rec #:  [id number]       Height:        69.0 in Accession #:    [id number]      Weight:       145.0 lb Date of Birth:  09-24-1929       BSA:          1.802 m Patient Age:    36 years        BP:           95/66 mmHg Patient Gender: M               HR:           108 bpm. Exam Location:  Inpatient Procedure: 2D Echo, Cardiac Doppler, Color Doppler and Intracardiac            Opacification Agent Indications:    CHF- Acute Diastolic T62.56  History:        Patient has no prior history of Echocardiogram examinations.                 Abnormal ECG; Risk Factors:Dyslipidemia.  Sonographer:    Ronny Flurry Referring Phys: Graylin Shiver [facility]  Sonographer Comments: No parasternal window, suboptimal apical window and Technically difficult study due to poor echo windows. Image acquisition challenging due to patient body habitus and Image acquisition challenging due to respiratory motion. IMPRESSIONS  1. Left ventricular ejection fraction, by estimation, is 70 to 75%. The left ventricle has hyperdynamic function. The left ventricle has no regional wall motion abnormalities. Left ventricular diastolic parameters are indeterminate.  2. Right ventricular systolic function is normal. The right ventricular size is normal. Tricuspid regurgitation signal is inadequate for assessing PA pressure.  3. The mitral valve is normal in structure. No evidence of mitral valve regurgitation. No evidence of mitral stenosis.  4. The aortic valve was not well visualized. Aortic valve regurgitation is not visualized. No aortic stenosis is present.  5. The inferior vena cava is normal in size with <50% respiratory variability, suggesting right atrial pressure of 8 mmHg. FINDINGS  Left Ventricle: Left ventricular ejection fraction, by estimation, is 70 to 75%. The left ventricle has hyperdynamic function. The left ventricle has no regional wall motion abnormalities. Definity contrast agent was given IV to delineate the left ventricular endocardial borders. The left ventricular internal  cavity size was small. There is no left ventricular hypertrophy. Left ventricular diastolic parameters are  indeterminate. Right Ventricle: The right ventricular size is normal. No increase in right ventricular wall thickness. Right ventricular systolic function is normal. Tricuspid regurgitation signal is inadequate for assessing PA pressure. Left Atrium: Left atrial size was normal in size. Right Atrium: Right atrial size was normal in size. Pericardium: Trivial pericardial effusion is present. Mitral Valve: The mitral valve is normal in structure. No evidence of mitral valve regurgitation. No evidence of mitral valve stenosis. Tricuspid Valve: The tricuspid valve is normal in structure. Tricuspid valve regurgitation is trivial. Aortic Valve: The aortic valve was not well visualized. Aortic valve regurgitation is not visualized. No aortic stenosis is present. Aortic valve mean gradient measures 6.0 mmHg. Aortic valve peak gradient measures 10.5 mmHg. Pulmonic Valve: The pulmonic valve was not well visualized. Pulmonic valve regurgitation is not visualized. Aorta: The aortic root was not well visualized. Venous: The inferior vena cava is normal in size with less than 50% respiratory variability, suggesting right atrial pressure of 8 mmHg. IAS/Shunts: The interatrial septum was not well visualized.   LV Volumes (MOD) LV vol d, MOD A2C: 48.5 ml Diastology LV vol d, MOD A4C: 82.9 ml LV e' medial:    6.20 cm/s LV vol s, MOD A2C: 13.4 ml LV E/e' medial:  11.8 LV vol s, MOD A4C: 16.6 ml LV e' lateral:   7.62 cm/s LV SV MOD A2C:     35.1 ml LV E/e' lateral: 9.6 LV SV MOD A4C:     82.9 ml LV SV MOD BP:      49.4 ml RIGHT VENTRICLE            IVC RV Basal diam:  2.30 cm    IVC diam: 1.60 cm RV S prime:     9.95 cm/s TAPSE (M-mode): 1.2 cm LEFT ATRIUM           Index        RIGHT ATRIUM          Index LA Vol (A2C): 21.0 ml 11.65 ml/m  RA Area:     9.94 cm LA Vol (A4C): 15.4 ml 8.55 ml/m   RA Volume:   17.90 ml 9.93 ml/m   AORTIC VALVE AV Vmax:           162.00 cm/s AV Vmean:          111.000 cm/s AV VTI:            0.240 m AV Peak Grad:      10.5 mmHg AV Mean Grad:      6.0 mmHg LVOT Vmax:         135.67 cm/s LVOT Vmean:        91.633 cm/s LVOT VTI:          0.195 m LVOT/AV VTI ratio: 0.81 MITRAL VALVE MV Area (PHT): 4.39 cm    SHUNTS MV Decel Time: 173 msec    Systemic VTI: 0.19 m MR Peak grad: 8.3 mmHg MR Vmax:      144.00 cm/s MV E velocity: 73.20 cm/s MV A velocity: 86.40 cm/s MV E/A ratio:  0.85 Oswaldo Milian MD Electronically signed by Oswaldo Milian MD Signature Date/Time: 02/02/2022/3:07:23 PM    Final       Signature  Lala Lund M.D on 02/06/2022 at 9:45 AM   -  To page go to [email or web address]

## 2022-02-06 NOTE — Progress Notes (Signed)
   Palliative Medicine Inpatient Follow Up Note  HPI:  88 y.o. male  with past medical history of mild dementia, hypothyroid, BPH admitted on 02/19/2022 with sepsis due to UTI likely related to urinary retention from BPH.  He has a Foley in place.  He has had some transient shortness of breath with given dose of Lasix.  Chest x-ray was clear.  Echocardiogram is pending.  Palliative medicine consulted for 88 yr old, frail with some dementia, Uro sepsis, GOC.     Today's Discussion 02/06/2022  *Please note that this is a verbal dictation therefore any spelling or grammatical errors are due to the "[place] [employer]" system interpretation.  Chart reviewed inclusive of vital signs, progress notes, laboratory results, and diagnostic images.   I spoke to patients night RN who shares that overall, Tommy Browning has been stable. She expresses that he has had ongoing bouts of disorientation.   I met with Tommy Browning at bedside this morning, he was noted to be breathing a bit more rapidly though denied shortness of breath. He shares that he does not feel worse nor better. He is oriented to person though not much else this morning. He does have some left sided neck pain and we talked about starting some tylenol to support this. Discussed the goals of mobility.   I called patients daughter this afternoon, she expresses that Tommy Browning is slowly improving and his diet has been advanced. She and her mother remain cautiously optimistic.   Questions and concerns addressed/Palliative Support Provided.   Objective Assessment: Vital Signs Vitals:   02/06/22 0437 02/06/22 0815  BP: 93/61 (!) 94/58  Pulse: 93 92  Resp: 20 (!) 25  Temp: 99 F (37.2 C) 99.9 F (37.7 C)  SpO2: 100%     Intake/Output Summary (Last 24 hours) at 02/06/2022 1158 Last data filed at 02/06/2022 0400 Gross per 24 hour  Intake --  Output 650 ml  Net -650 ml   Last Weight  Most recent update: 02/01/2022  4:41 PM    Weight  65.8 kg (145 lb)             Gen:  Frail elderly AA M in mild distress HEENT: Dry  mucous membranes CV: Regular rate and rhythm PULM: clear to auscultation bilaterally ABD: soft/nontender EXT: No edema Neuro: Alert and oriented x3  SUMMARY OF RECOMMENDATIONS   DNAR/DNI  Continue to treat the treatable  Neck Pain - Added tylenol 521m ATC  Goals remain for improvement in mentation, mobility, nutrition  Ongoing Palliative Support  Billing based on MDM: Moderate ______________________________________________________________________________________ MStacey StreetTeam Team Cell Phone: [phone] utilize secure chat with additional questions, if there is no response within 30 minutes please call the above phone number  Palliative Medicine Team providers are available by phone from 7am to 7pm daily and can be reached through the team cell phone.  Should this patient require assistance outside of these hours, please call the patient's attending physician.

## 2022-02-07 ENCOUNTER — Inpatient Hospital Stay (HOSPITAL_COMMUNITY): Payer: Medicare PPO

## 2022-02-07 ENCOUNTER — Encounter (HOSPITAL_COMMUNITY): Payer: Self-pay | Admitting: Internal Medicine

## 2022-02-07 DIAGNOSIS — N390 Urinary tract infection, site not specified: Secondary | ICD-10-CM | POA: Diagnosis not present

## 2022-02-07 DIAGNOSIS — Z7189 Other specified counseling: Secondary | ICD-10-CM | POA: Diagnosis not present

## 2022-02-07 DIAGNOSIS — A419 Sepsis, unspecified organism: Secondary | ICD-10-CM | POA: Diagnosis not present

## 2022-02-07 DIAGNOSIS — Z515 Encounter for palliative care: Secondary | ICD-10-CM | POA: Diagnosis not present

## 2022-02-07 LAB — CBC WITH DIFFERENTIAL/PLATELET
Abs Immature Granulocytes: 0.58 10*3/uL — ABNORMAL HIGH (ref 0.00–0.07)
Basophils Absolute: 0 10*3/uL (ref 0.0–0.1)
Basophils Relative: 0 %
Eosinophils Absolute: 0.4 10*3/uL (ref 0.0–0.5)
Eosinophils Relative: 2 %
HCT: 25.5 % — ABNORMAL LOW (ref 39.0–52.0)
Hemoglobin: 8.7 g/dL — ABNORMAL LOW (ref 13.0–17.0)
Immature Granulocytes: 3 %
Lymphocytes Relative: 4 %
Lymphs Abs: 0.8 10*3/uL (ref 0.7–4.0)
MCH: 30.4 pg (ref 26.0–34.0)
MCHC: 34.1 g/dL (ref 30.0–36.0)
MCV: 89.2 fL (ref 80.0–100.0)
Monocytes Absolute: 1 10*3/uL (ref 0.1–1.0)
Monocytes Relative: 5 %
Neutro Abs: 19 10*3/uL — ABNORMAL HIGH (ref 1.7–7.7)
Neutrophils Relative %: 86 %
Platelets: 436 10*3/uL — ABNORMAL HIGH (ref 150–400)
RBC: 2.86 MIL/uL — ABNORMAL LOW (ref 4.22–5.81)
RDW: 14.6 % (ref 11.5–15.5)
WBC: 21.8 10*3/uL — ABNORMAL HIGH (ref 4.0–10.5)
nRBC: 0 % (ref 0.0–0.2)

## 2022-02-07 LAB — OSMOLALITY: Osmolality: 312 mOsm/kg — ABNORMAL HIGH (ref 275–295)

## 2022-02-07 LAB — BASIC METABOLIC PANEL
Anion gap: 11 (ref 5–15)
BUN: 61 mg/dL — ABNORMAL HIGH (ref 8–23)
CO2: 23 mmol/L (ref 22–32)
Calcium: 8.5 mg/dL — ABNORMAL LOW (ref 8.9–10.3)
Chloride: 101 mmol/L (ref 98–111)
Creatinine, Ser: 2.68 mg/dL — ABNORMAL HIGH (ref 0.61–1.24)
GFR, Estimated: 22 mL/min — ABNORMAL LOW (ref >60)
Glucose, Bld: 275 mg/dL — ABNORMAL HIGH (ref 70–99)
Potassium: 3.3 mmol/L — ABNORMAL LOW (ref 3.5–5.1)
Sodium: 135 mmol/L (ref 135–145)

## 2022-02-07 LAB — PROCALCITONIN: Procalcitonin: 6.52 ng/mL

## 2022-02-07 LAB — BRAIN NATRIURETIC PEPTIDE: B Natriuretic Peptide: 2782.3 pg/mL — ABNORMAL HIGH (ref 0.0–100.0)

## 2022-02-07 LAB — C-REACTIVE PROTEIN: CRP: 26.2 mg/dL — ABNORMAL HIGH (ref <1.0)

## 2022-02-07 LAB — MAGNESIUM: Magnesium: 2.4 mg/dL (ref 1.7–2.4)

## 2022-02-07 LAB — URIC ACID: Uric Acid, Serum: 7 mg/dL (ref 3.7–8.6)

## 2022-02-07 MED ORDER — HYDROMORPHONE HCL 1 MG/ML IJ SOLN
0.5000 mg | INTRAMUSCULAR | Status: DC
  Administered 2022-02-07 – 2022-02-08 (×3): 0.5 mg via INTRAVENOUS
  Filled 2022-02-07 (×3): qty 0.5

## 2022-02-07 MED ORDER — LORAZEPAM 2 MG/ML IJ SOLN
0.5000 mg | INTRAMUSCULAR | Status: DC | PRN
  Filled 2022-02-07: qty 1

## 2022-02-07 MED ORDER — LORAZEPAM 2 MG/ML IJ SOLN
0.5000 mg | INTRAMUSCULAR | Status: DC
  Administered 2022-02-08: 0.5 mg via INTRAVENOUS
  Filled 2022-02-07 (×2): qty 1

## 2022-02-07 MED ORDER — GLYCOPYRROLATE 1 MG PO TABS: 1.0000 mg | ORAL_TABLET | ORAL | Status: DC | PRN

## 2022-02-07 MED ORDER — POTASSIUM CHLORIDE 20 MEQ PO PACK
40.0000 meq | PACK | Freq: Once | ORAL | Status: AC
  Administered 2022-02-07: 40 meq via ORAL
  Filled 2022-02-07: qty 2

## 2022-02-07 MED ORDER — PROSOURCE PLUS PO LIQD
30.0000 mL | Freq: Two times a day (BID) | ORAL | Status: DC
  Filled 2022-02-07: qty 30

## 2022-02-07 MED ORDER — KETOROLAC TROMETHAMINE 15 MG/ML IJ SOLN
15.0000 mg | Freq: Two times a day (BID) | INTRAMUSCULAR | Status: DC
  Administered 2022-02-07 – 2022-02-10 (×5): 15 mg via INTRAVENOUS
  Filled 2022-02-07 (×5): qty 1

## 2022-02-07 MED ORDER — HYDROMORPHONE HCL 1 MG/ML IJ SOLN
0.5000 mg | INTRAMUSCULAR | Status: DC | PRN
  Administered 2022-02-11: 1 mg via INTRAVENOUS
  Filled 2022-02-07: qty 1

## 2022-02-07 MED ORDER — BOOST PLUS PO LIQD
237.0000 mL | Freq: Three times a day (TID) | ORAL | Status: DC
  Filled 2022-02-07 (×4): qty 237

## 2022-02-07 MED ORDER — FUROSEMIDE 10 MG/ML IJ SOLN
60.0000 mg | Freq: Once | INTRAMUSCULAR | Status: AC
  Administered 2022-02-07: 60 mg via INTRAVENOUS
  Filled 2022-02-07: qty 6

## 2022-02-07 MED ORDER — ALBUTEROL SULFATE (2.5 MG/3ML) 0.083% IN NEBU: 2.5000 mg | INHALATION_SOLUTION | RESPIRATORY_TRACT | Status: DC | PRN

## 2022-02-07 MED ORDER — BIOTENE DRY MOUTH MT LIQD: 15.0000 mL | OROMUCOSAL | Status: DC | PRN

## 2022-02-07 MED ORDER — POTASSIUM CHLORIDE 10 MEQ/100ML IV SOLN: 10.0000 meq | INTRAVENOUS | Status: DC

## 2022-02-07 MED ORDER — POLYVINYL ALCOHOL 1.4 % OP SOLN: 1.0000 [drp] | Freq: Four times a day (QID) | OPHTHALMIC | Status: DC | PRN

## 2022-02-07 MED ORDER — GLYCOPYRROLATE 0.2 MG/ML IJ SOLN: 0.2000 mg | INTRAMUSCULAR | Status: DC | PRN

## 2022-02-07 MED ORDER — GLYCOPYRROLATE 0.2 MG/ML IJ SOLN
0.4000 mg | INTRAMUSCULAR | Status: DC | PRN
  Administered 2022-02-08: 0.4 mg via INTRAVENOUS
  Filled 2022-02-07: qty 2

## 2022-02-07 MED ORDER — METOPROLOL TARTRATE 12.5 MG HALF TABLET: 12.5000 mg | ORAL_TABLET | Freq: Two times a day (BID) | ORAL | Status: DC

## 2022-02-07 MED ORDER — FUROSEMIDE 10 MG/ML IJ SOLN: 20.0000 mg | Freq: Once | INTRAMUSCULAR | Status: DC

## 2022-02-07 NOTE — Progress Notes (Signed)
   Palliative Medicine Inpatient Follow Up Note  I met this evening with patients wife and daughter. Discussed his present state and his lack of improvement over the day. Reviewed the goal of three more family members coming to town to see patient and say their goodbyes. In the meanwhile family determined that they do not want Dilan to endure anymore suffering than he has already.   Discussed transition to comfort care which family is in agreement with. At this time we will try to manage symptoms with IVP medications ATC.  Family encouraged to spend the night and told he do not have a clear time frame but I would anticipate hours to days at the most.  Palliative support provided.  Nursing and medical staff informed of the changes.   SUMMARY OF RECOMMENDATIONS   DNAR/DNI  Comfort focused Care  Dilaudid and Ativan 0.47m IVP Q4H ATC  Tordol Q12H to aid in fever management  [facility] Prognosis of hours to days  Additional Time Spent:  57 Billing based on MDM: High  ______________________________________________________________________________________ MWillisTeam Team Cell Phone: [phone] utilize secure chat with additional questions, if there is no response within 30 minutes please call the above phone number  Palliative Medicine Team providers are available by phone from 7am to 7pm daily and can be reached through the team cell phone.  Should this patient require assistance outside of these hours, please call the patient's attending physician.

## 2022-02-07 NOTE — Progress Notes (Signed)
0625 Patient Spo2 dropped down to 78 % with 3L oxygen. Patient was tachypneic having some wheezes in breathing. Gave PRN nebuliser. Called RT. RT attended the patient, put him on venturi mask at 8L. SpO2 level maintained at 89%. Notified MD.Stat orders from MD. Fluid stopped, Lasix given. MD attended the patient. Family members informed by MD.Put him on Non rebreather mask. SpO2 maintained at 93%

## 2022-02-07 NOTE — Progress Notes (Signed)
Palliative Medicine Inpatient Follow Up Note HPI:  88 y.o. male  with past medical history of mild dementia, hypothyroid, BPH admitted on 02/23/2022 with sepsis due to UTI likely related to urinary retention from BPH.  He has a Foley in place.  He has had some transient shortness of breath with given dose of Lasix.  Chest x-ray was clear.  Echocardiogram is pending.  Palliative medicine consulted for 88 yr old, frail with some dementia, Uro sepsis, GOC.     Today's Discussion 02/07/2022  *Please note that this is a verbal dictation therefore any spelling or grammatical errors are due to the "[place] [employer]" system interpretation.  Chart reviewed inclusive of vital signs, progress notes, laboratory results, and diagnostic images.   Tommy Browning experienced an acute decline in the setting of suspect aspiration. When seen at bedside he is on a non-rebreather mask, somnolent, urine is a deep amber color.   I met at bedside with patients wife, Tommy Browning and daughter, Tommy Browning. We reviewed that Tommy Browning has declined tremendously. Offered empathy when communicating that I worry we are encroaching the end of Tommy Browning's journey on earth. I shared that it may be of utility to have friends and family visit. Discussed what transition to comfort care looks like.   We talked about transition to comfort measures in house and what that would entail inclusive of medications to control pain, dyspnea, agitation, nausea, itching, and hiccups.  We discussed stopping all uneccessary measures such as cardiac monitoring, blood draws, needle sticks, and frequent vital signs. Utilized reflective listening throughout our time together as patients spouse was very tearful.   I shared in Tommy Browning's situation I would recommend if we elect to go a comfort care path starting him on a low dose dilaudid gtt to support symptoms.   Patients wife and daughter express that they need time to consider their options of either continuing with aggressive  measures of care of focusing on comfort.   Questions and concerns addressed/Palliative Support Provided.   Objective Assessment: Vital Signs Vitals:   02/07/22 0830 02/07/22 0929  BP: 132/74 127/84  Pulse: (!) 113 (!) 105  Resp: (!) 30 (!) 30  Temp: 99.2 F (37.3 C)   SpO2:      Intake/Output Summary (Last 24 hours) at 02/07/2022 1016 Last data filed at 02/07/2022 0500 Gross per 24 hour  Intake --  Output 550 ml  Net -550 ml    Last Weight  Most recent update: 02/01/2022  4:41 PM    Weight  65.8 kg (145 lb)            Gen:  Frail elderly AA M in mild distress HEENT: Dry  mucous membranes CV: Irregular rate and rhythm PULM:On NRB FM, labored breathing ABD: soft/nontender EXT: No edema Neuro: Alert and oriented x3  SUMMARY OF RECOMMENDATIONS   DNAR/DNI  Patient has had an acute decompensation and appears to be actively dying  Open and honest conversations were held about continuing aggressive measures or transitioning focus to comfort care --> Patients family need time to think about this  Ongoing Palliative Support  Billing based on MDM: High ______________________________________________________________________________________ [place] Team Team Cell Phone: [phone] Please utilize secure chat with additional questions, if there is no response within 30 minutes please call the above phone number  Palliative Medicine Team providers are available by phone from 7am to 7pm daily and can be reached through the team cell phone.  Should this patient require assistance outside  of these hours, please call the patient's attending physician.

## 2022-02-07 NOTE — Progress Notes (Signed)
PROGRESS NOTE                                                                                                                                                                                                             Patient Demographics:    Tommy Browning, is a 88 y.o. male, DOB - 1929/10/05, MEB:[id number]  Outpatient Primary MD for the patient is Hoyt Koch, MD    LOS - 7  Admit date - 02/10/2022    Chief Complaint  Patient presents with   Altered Mental Status       Brief Narrative (HPI from H&P)    88 y.o. male with medical history significant for BPH, hypothyroidism and dementia was brought in by EMS for evaluation of a one-week history of weakness and lethargy with decreased oral intake, and frequent urination that was 'very dark to red".  At baseline patient is ambulant able to participate in ADLs but definitely had a sustained decline, he was less active, not eating well and required assistance in ambulation for several days prior to hospital admission, however there is a sustained pattern of deconditioning, decreased appetite and oral intake for several months. The ER he was diagnosed with urinary retention with sepsis, UTI and toxic encephalopathy and admitted.   Subjective:   Weak frail elderly African-American gentleman lying in hospital bed in no distress, neck twisted to the right, denies any headache chest or abdominal pain.   Assessment  & Plan :   Sepsis secondary to UTI caused by urinary retention.  Underlying history of BPH. Foley placed in the ER, on empiric antibiotics, sepsis pathophysiology is improving, add Flomax with outpatient urology follow-up.  Will be discharged with Foley catheter.  Possible aspiration pneumonia.  High white count, low-grade fevers with rising procalcitonin.  At risk for aspiration, might have early aspiration pneumonia although chest x-ray not revealing, switch  antibiotics to Unasyn on 02/03/2022, seen by speech currently n.p.o. except medications, keep head of the bed elevated, aspiration precautions.  Overall getting progressively weak due to underlying dementia and deconditioning, weak neck and throat muscles, risk for ongoing aspiration even with oral secretions is very high.  Explained to the family in detail.  Frequent hiccups.  Improved with Thorazine.  KUB stable.  Dehydration, hypernatremia and AKI.  Hydrated with IV fluids, however due to lack of  intravascular protein he is third spacing and developing pulmonary edema frequently, explained to the family, given trial of IV albumin and IV fluids but developed pulmonary edema again on 02/07/2022, this had happened earlier on 02/04/2022.  Both had to be stopped, IV Lasix, renal ultrasound stable.  Nephrology consulted, poor candidate for dialysis.  Likely transition to comfort measures.   Recurrent pulmonary edema.  See above.  IV fluids IV albumin held, Lasix IV on 02/07/2022, nonrebreather mask, nebulizer treatments, head of the bed elevated, likely need to transition to comfort care soon.  Acute metabolic encephalopathy in a patient with underlying dementia, extremely frail and deconditioned with advanced age - Secondary to sepsis.  He is pretty deconditioned and frail at baseline, according to the outpatient notes he stopped taking his dementia medications 4 to 5 months ago, had poor appetite with weight loss ongoing for several weeks.  Was becoming increasingly withdrawn, not eating or drinking at least 7 -10 days prior to hospital admission and required assistance in ambulation at least 4 to 5 days prior to hospital admission.  Overall extremely frail, very high risk for repeated aspiration, poor long-term prognosis.  Continue supportive care.  DNR now.  Hypothyroidism - Not currently on levothyroxine, stable TSH  Dementia without behavioral disturbance - Hold mirtazapine due to lethargy, delirium  precautions, minimize narcotics and benzodiazepines.  Mild shortness of breath on 02/01/2022.  Was nonspecific, question of mild fluid overload and nonspecific CHF, no echo on file, nonacute echocardiogram, fluids reduced, symptoms resolved chest x-ray stable continue to monitor.       Condition -  Guarded  Family Communication  : Daughter and wife in detail on 02/01/2022, wife at bedside on 02/02/2022, daughter and wife bedside 02/03/2022 , detailed discussion bedside with daughter, wife and niece on 02/04/2022.  Now DNR.  Continue medical treatment but no excessive heroics.  Updated wife and daughter 02/05/2022 bedside  Code Status : DNR  Consults  : Palliative care for goals of care  PUD Prophylaxis :    Procedures  :     TTE - 1. Left ventricular ejection fraction, by estimation, is 70 to 75%. The left ventricle has hyperdynamic function. The left ventricle has no regional wall motion abnormalities. Left ventricular diastolic parameters are indeterminate.  2. Right ventricular systolic function is normal. The right ventricular size is normal. Tricuspid regurgitation signal is inadequate for assessing PA pressure.  3. The mitral valve is normal in structure. No evidence of mitral valve regurgitation. No evidence of mitral stenosis.  4. The aortic valve was not well visualized. Aortic valve regurgitation is not visualized. No aortic stenosis is present.  5. The inferior vena cava is normal in size with <50% respiratory variability, suggesting right atrial pressure of 8 mmHg.  CT head.  Non acute  CT abdomen pelvis -  Unremarkable CT abdomen/pelvis.      Disposition Plan  :    Status is: Inpatient  DVT Prophylaxis  :    enoxaparin (LOVENOX) injection 30 mg Start: 02/06/22 1000    Lab Results  Component Value Date   PLT 436 (H) 02/07/2022    Diet :  Diet Order             Diet full liquid Room service appropriate? No; Fluid consistency: Thin  Diet effective now                     Inpatient Medications  Scheduled Meds:  (feeding supplement) PROSource Plus  30  mL Oral BID BM   acetaminophen  500 mg Oral TID   aspirin EC  81 mg Oral Daily   Chlorhexidine Gluconate Cloth  6 each Topical Daily   cyanocobalamin  1,000 mcg Oral Daily   diclofenac Sodium  2 g Topical QID   docusate sodium  200 mg Oral BID   enoxaparin (LOVENOX) injection  30 mg Subcutaneous Daily   lactose free nutrition  237 mL Oral TID WC   latanoprost  1 drop Both Eyes QHS   metoprolol tartrate  12.5 mg Oral BID   midodrine  10 mg Oral TID   omega-3 acid ethyl esters  1 g Oral Daily   polyethylene glycol  17 g Oral BID   scopolamine  1 patch Transdermal Q72H   tamsulosin  0.4 mg Oral Daily   Continuous Infusions:  ampicillin-sulbactam (UNASYN) IV 3 g (02/06/22 2101)   chlorproMAZINE (THORAZINE) 12.5 mg in sodium chloride 0.9 % 25 mL IVPB     PRN Meds:.acetaminophen **OR** acetaminophen, chlorproMAZINE (THORAZINE) 12.5 mg in sodium chloride 0.9 % 25 mL IVPB, ipratropium-albuterol, ondansetron **OR** ondansetron (ZOFRAN) IV, sodium chloride    Objective:   Vitals:   02/07/22 0013 02/07/22 0434 02/07/22 0741 02/07/22 0830  BP: (!) 95/58 104/69  132/74  Pulse: 88 98 (!) 108 (!) 113  Resp: 20 (!) 24 (!) 22 (!) 30  Temp: 98 F (36.7 C) 98 F (36.7 C)  99.2 F (37.3 C)  TempSrc: Oral Oral  Oral  SpO2: 93% 94%    Weight:      Height:        Wt Readings from Last 3 Encounters:  02/01/22 65.8 kg  01/27/22 65.8 kg  09/24/21 69.3 kg     Intake/Output Summary (Last 24 hours) at 02/07/2022 0859 Last data filed at 02/07/2022 0500 Gross per 24 hour  Intake --  Output 550 ml  Net -550 ml     Physical Exam  Elderly, frail African-American gentleman who is Sleepy appears extremely weak and drained this morning, no focal deficits,   Tommy Browning,PERRAL Supple Neck, No JVD,   Symmetrical Chest wall movement, coarse bilateral crackles RRR,No Gallops, Rubs or new Murmurs,  +ve  B.Sounds, Abd Soft, No tenderness,   No Cyanosis, Clubbing or edema     Data Review:    CBC Recent Labs  Lab 02/03/22 0442 02/04/22 0646 02/05/22 0515 02/06/22 0245 02/07/22 0435  WBC 22.0* 25.3* 25.4* 21.8* 21.8*  HGB 11.7* 11.8* 10.8* 8.5* 8.7*  HCT 33.7* 32.9* 31.1* 25.8* 25.5*  PLT 438* 468* 469* 408* 436*  MCV 87.5 85.5 86.9 90.2 89.2  MCH 30.4 30.6 30.2 29.7 30.4  MCHC 34.7 35.9 34.7 32.9 34.1  RDW 14.2 14.0 14.1 14.2 14.6  LYMPHSABS 1.3 1.0 1.1 0.8 0.8  MONOABS 2.2* 2.1* 1.9* 1.5* 1.0  EOSABS 0.4 0.5 0.3 0.5 0.4  BASOSABS 0.1 0.1 0.1 0.1 0.0    Electrolytes Recent Labs  Lab 02/01/22 0230 02/02/22 0447 02/03/22 0135 02/03/22 0442 02/03/22 0703 02/04/22 0646 02/05/22 0515 02/06/22 0245 02/07/22 0435  NA 147* 141   < > 134*  --  134* 139 136 135  K 4.1 3.3*   < > 3.6  --  3.7 3.6 3.0* 3.3*  CL 109 105   < > 99  --  100 101 100 101  CO2 27 24   < > 22  --  '24 26 23 23  '$ GLUCOSE 116* 100*   < > 99  --  132*  99 159* 275*  BUN 49* 38*   < > 34*  --  33* 43* 53* 61*  CREATININE 1.83* 1.47*   < > 1.34*  --  1.36* 1.78* 1.93* 2.68*  CALCIUM 9.3 8.6*   < > 8.3*  --  8.2* 8.1* 8.4* 8.5*  MG  --  2.2  --  2.2  --  2.2 2.3  --  2.4  CRP  --   --   --   --  35.1* 41.5* 37.0* 25.2* 26.2*  PROCALCITON  --   --   --   --  17.48 4.70 6.42 6.00 6.52  TSH 1.216  --   --   --   --   --   --   --   --   BNP  --  195.6*  --  215.1*  --  253.6* 278.6*  --  2,782.3*   < > = values in this interval not displayed.      Radiology Reports  DG Chest Port 1 View  Result Date: 02/07/2022 CLINICAL DATA:  Shortness of breath EXAM: PORTABLE CHEST 1 VIEW COMPARISON:  02/06/2022 FINDINGS: Heart is normal size. Worsening patchy bilateral airspace disease and interstitial prominence. No effusions or pneumothorax. No acute bony abnormality. IMPRESSION: Worsening bilateral patchy airspace disease and interstitial prominence concerning for pneumonia. Electronically Signed   By: Rolm Baptise  M.D.   On: 02/07/2022 08:33   US RENAL  Result Date: 02/06/2022 CLINICAL DATA:  Acute kidney injury EXAM: RENAL / URINARY TRACT ULTRASOUND COMPLETE COMPARISON:  None Available. FINDINGS: Right Kidney: Length = 9.9 cm AP renal pelvis diameter = <10 mm Normal parenchymal echogenicity with preserved corticomedullary differentiation. No urinary tract dilation or shadowing calculi. The ureter is not seen. Left Kidney: Length = 9.5 cm AP renal pelvis diameter = <10 mm Lower pole simple cyst measures 3.2 cm. No follow-up imaging recommended. Normal parenchymal echogenicity with preserved corticomedullary differentiation. No urinary tract dilation or shadowing calculi. The ureter is not seen. Bladder: Decompressed with catheter in-situ. Other: Layering sludge within the gallbladder. No gallbladder mural thickening or pericholecystic free fluid. Left pleural effusion. IMPRESSION: 1.  No urinary tract dilation or shadowing calculi. 2. Gallbladder sludge without evidence of acute cholecystitis. 3. Left pleural effusion. Electronically Signed   By: Darrin Nipper M.D.   On: 02/06/2022 11:07   DG Chest Port 1 View  Result Date: 02/06/2022 CLINICAL DATA:  Shortness of breath. EXAM: PORTABLE CHEST 1 VIEW COMPARISON:  One-view chest x-ray 02/05/2022. FINDINGS: Heart size is normal. Slight increase of interstitial and airspace opacities is noted bilaterally, particularly at the right base. No significant edema or effusions are present. IMPRESSION: Slight increase of interstitial and airspace opacities bilaterally, particularly at the right base. While this may represent atelectasis, infection or aspiration are not excluded. Electronically Signed   By: San Morelle M.D.   On: 02/06/2022 06:38   DG Chest Port 1 View  Result Date: 02/05/2022 CLINICAL DATA:  88 year old male with shortness of breath. EXAM: PORTABLE CHEST 1 VIEW COMPARISON:  Portable chest 02/04/2022 and earlier. FINDINGS: Portable AP semi upright view  at 0730 hours. Mildly rotated to the right. Lung volumes and mediastinal contours are stable. Lung markings appear stable. Allowing for portable technique the lungs are clear. No pneumothorax. Negative visible bowel gas. No acute osseous abnormality identified. IMPRESSION: No acute cardiopulmonary abnormality. Electronically Signed   By: Genevie Ann M.D.   On: 02/05/2022 07:44   DG Chest [facility]  Result Date: 02/04/2022 CLINICAL DATA:  Provided history: Shortness of breath, nausea. EXAM: PORTABLE CHEST 1 VIEW COMPARISON:  Prior chest radiographs 02/03/2022 and earlier. FINDINGS: Somewhat shallow inspiration radiograph. The cardiomediastinal silhouette is unchanged. Minimal ill-defined opacity within the medial lung bases, bilaterally, likely reflecting atelectasis. No evidence of pleural effusion or pneumothorax. No acute bony abnormality identified. Degenerative changes of the spine. IMPRESSION: Shallow inspiration radiograph. Minimal ill-defined opacity within the medial lung bases, bilaterally, likely reflecting atelectasis. Electronically Signed   By: Kellie Simmering D.O.   On: 02/04/2022 08:34   EEG adult  Result Date: 02/03/2022 Lora Havens, MD     02/03/2022  8:20 PM Patient Name: Karl Luke. MRN: [id number] Epilepsy Attending: Lora Havens Referring Physician/Provider: Thurnell Lose, MD Date: 02/03/2022 Duration: 23.17 mins Patient history: 88yo M with ams. EEG to evaluate for seizure Level of alertness: Awake, asleep AEDs during EEG study: None Technical aspects: This EEG study was done with scalp electrodes positioned according to the 10-20 International system of electrode placement. Electrical activity was reviewed with band pass filter of 1-'70Hz'$ , sensitivity of 7 uV/mm, display speed of 34m/sec with a '60Hz'$  notched filter applied as appropriate. EEG data were recorded continuously and digitally stored.  Video monitoring was available and reviewed as appropriate. Description: The  posterior dominant rhythm consists of 8 Hz activity of moderate voltage (25-35 uV) seen predominantly in posterior head regions, symmetric and reactive to eye opening and eye closing. Sleep was characterized by vertex waves, sleep spindles (12 to 14 Hz), maximal frontocentral region. EEG showed intermittent generalized 5 to 6 Hz theta slowing. Hyperventilation and photic stimulation were not performed.   ABNORMALITY - Intermittent slow, generalized IMPRESSION: This study is suggestive of mild diffuse encephalopathy, nonspecific etiology. No seizures or epileptiform discharges were seen throughout the recording. PLora Havens     Signature  PLala LundM.D on 02/07/2022 at 8:59 AM   -  To page go to [email or web address]

## 2022-02-07 NOTE — Progress Notes (Signed)
Pharmacy Antibiotic Note  Tommy Browning. is a 88 y.o. male who presented with increased legthargy and weakness with poor oral intake and frequent urination with discoloration. He was previously on vanc and cefepime for a septic presentation. He is currently on day #6 Unasyn for aspiration pneumonia.  Creatinine trended up today from 1.93 to 2.68   Plan: Unasyn 3 gm IV q6h > q12h for current renal function. Will follow up renal function, culture data, clinical progress and antibiotic plans.  Height: '5\' 9"'$  (175.3 cm) Weight: 65.8 kg (145 lb) IBW/kg (Calculated) : 70.7  Temp (24hrs), Avg:98.6 F (37 C), Min:98 F (36.7 C), Max:99.5 F (37.5 C)  Recent Labs  Lab 02/03/22 0442 02/04/22 0646 02/05/22 0515 02/06/22 0245 02/07/22 0435  WBC 22.0* 25.3* 25.4* 21.8* 21.8*  CREATININE 1.34* 1.36* 1.78* 1.93* 2.68*     Estimated Creatinine Clearance: 17.1 mL/min (A) (by C-G formula based on SCr of 2.68 mg/dL (H)).    No Known Allergies  Antimicrobials this admission: Vancomycin x 1 on 11/4 Cefepime 11/4 x1 Ceftriaxone 11/5 >> 11/6 Unasyn 11/7 >>   Dose adjustments this admission: 11/9: Unasyn 3 gm IV q6h > q12h for increased creatinine  Microbiology results: 11/4 blood: no growth x 4 days to date 11/7 blood: no growth x 2 days to date 11/7 MRSA PCR: neg  Thank you for allowing pharmacy to be a part of this patient's care.  Vicenta Dunning, PharmD  PGY1 Pharmacy Resident

## 2022-02-08 DIAGNOSIS — F03A0 Unspecified dementia, mild, without behavioral disturbance, psychotic disturbance, mood disturbance, and anxiety: Secondary | ICD-10-CM | POA: Diagnosis not present

## 2022-02-08 DIAGNOSIS — A419 Sepsis, unspecified organism: Secondary | ICD-10-CM | POA: Diagnosis not present

## 2022-02-08 DIAGNOSIS — Z7189 Other specified counseling: Secondary | ICD-10-CM

## 2022-02-08 DIAGNOSIS — Z515 Encounter for palliative care: Secondary | ICD-10-CM | POA: Diagnosis not present

## 2022-02-08 LAB — CULTURE, BLOOD (ROUTINE X 2)
Culture: NO GROWTH
Culture: NO GROWTH

## 2022-02-08 MED ORDER — LORAZEPAM 2 MG/ML IJ SOLN
1.0000 mg | INTRAMUSCULAR | Status: DC
  Administered 2022-02-08 – 2022-02-11 (×15): 1 mg via INTRAVENOUS
  Filled 2022-02-08 (×14): qty 1

## 2022-02-08 MED ORDER — GLYCOPYRROLATE 0.2 MG/ML IJ SOLN
0.4000 mg | INTRAMUSCULAR | Status: DC
  Administered 2022-02-08 – 2022-02-11 (×17): 0.4 mg via INTRAVENOUS
  Filled 2022-02-08 (×17): qty 2

## 2022-02-08 MED ORDER — HYDROMORPHONE HCL 1 MG/ML IJ SOLN
1.0000 mg | INTRAMUSCULAR | Status: DC
  Administered 2022-02-08 – 2022-02-11 (×17): 1 mg via INTRAVENOUS
  Filled 2022-02-08 (×17): qty 1

## 2022-02-08 NOTE — Progress Notes (Signed)
   Palliative Medicine Inpatient Follow Up Note HPI:  88 y.o. male  with past medical history of mild dementia, hypothyroid, BPH admitted on 02/25/2022 with sepsis due to UTI likely related to urinary retention from BPH.  He has a Foley in place.  He has had some transient shortness of breath with given dose of Lasix.  Chest x-ray was clear.  Echocardiogram is pending.  Palliative medicine consulted for 89 yr old, frail with some dementia, Uro sepsis, GOC.     Today's Discussion 02/08/2022  *Please note that this is a verbal dictation therefore any spelling or grammatical errors are due to the "[place] [employer]" system interpretation.  Chart reviewed inclusive of vital signs, progress notes, laboratory results, and diagnostic images.   I met with Jacson this morning, his granddaughter was present. We reviewed his present health state. ON assessment appeared to have labored breathing and more secretion burden. Patient asked to blow his nose which helped with secretions. Set up new suction equipment and repositioned. I shared that I would adjust medications for his comfort as well.   Dr. Candiss Norse assessed during my time at bedside.  Questions and concerns addressed/Palliative Support Provided.   Objective Assessment: Vital Signs Vitals:   02/07/22 2015 02/08/22 0415  BP: 101/69 125/75  Pulse: 91 97  Resp: (!) 36 20  Temp:    SpO2: 94%     Intake/Output Summary (Last 24 hours) at 02/08/2022 7829 Last data filed at 02/07/2022 2015 Gross per 24 hour  Intake --  Output 400 ml  Net -400 ml    Last Weight  Most recent update: 02/01/2022  4:41 PM    Weight  65.8 kg (145 lb)            Gen:  Frail elderly AA M in mild distress HEENT: Dry  mucous membranes CV: Irregular rate and rhythm PULM:On 10LPM HFNC, labored breathing ABD: soft/nontender EXT: No edema Neuro: Alert and oriented x3  SUMMARY OF RECOMMENDATIONS   DNAR/DNI   Comfort focused Care   Dilaudid and Ativan 18m IVP  Q4H ATC  Glycopyrrolate 452mIVP Q4H     Tordol Q12H to aid in fever management   Additional comfort medications per MAR   Prognosis of hours to days  Ongoing Palliative support  Billing based on MDM: High ______________________________________________________________________________________ [place] Team Cell Phone: [phone] utilize secure chat with additional questions, if there is no response within 30 minutes please call the above phone number  Palliative Medicine Team providers are available by phone from 7am to 7pm daily and can be reached through the team cell phone.  Should this patient require assistance outside of these hours, please call the patient's attending physician.

## 2022-02-08 NOTE — Progress Notes (Addendum)
PROGRESS NOTE                                                                                                                                                                                                             Patient Demographics:    Tommy Browning, is a 88 y.o. male, DOB - [birth year], ZOX:[id number]  Outpatient Primary MD for the patient is Hoyt Koch, MD    LOS - 8  Admit date - 02/10/2022    Chief Complaint  Patient presents with   Altered Mental Status       Brief Narrative (HPI from H&P)    88 y.o. male with medical history significant for BPH, hypothyroidism and dementia was brought in by EMS for evaluation of a one-week history of weakness and lethargy with decreased oral intake, and frequent urination that was 'very dark to red".  At baseline patient is ambulant able to participate in ADLs but definitely had a sustained decline, he was less active, not eating well and required assistance in ambulation for several days prior to hospital admission, however there is a sustained pattern of deconditioning, decreased appetite and oral intake for several months. The ER he was diagnosed with urinary retention with sepsis, UTI and toxic encephalopathy and admitted.   Subjective:   Weak frail elderly African-American gentleman coughing, copious secretions in the mouth from time to time, denies any chest pain.   Assessment  & Plan :    Full comfort care now.     Patient who is extremely frail, demented 88 year old gentleman admitted here failure to thrive for several days, getting weak with poor oral intake for several days at home remaining mostly bedbound for the last few days prior to admission, was diagnosed with UTI, was extremely frail and malnourished.  Subsequently developed aspiration pneumonia, AKI.  Due to lack of intravascular protein content and malnutrition any fluid given started giving him  pulmonary edema due to third spacing.  Palliative care was involved.  He was initially made DNR and after reasonable medical treatment was provided he was transitioned to full comfort care on 02/07/2022 afternoon.  He is now getting only comfort medications.  Other medical problems addressed earlier this admission are below.     Sepsis secondary to UTI caused by urinary retention.  Underlying history of BPH. Foley placed in the  ER, on empiric antibiotics, sepsis pathophysiology is improving, add Flomax with outpatient urology follow-up.  Will be discharged with Foley catheter.  Aspiration pneumonia.  High white count, low-grade fevers with rising procalcitonin.  At risk for aspiration, might have early aspiration pneumonia although chest x-ray not revealing, switch antibiotics to Unasyn on 02/03/2022, seen by speech currently n.p.o. except medications, keep head of the bed elevated, aspiration precautions,   Frequent hiccups.  Improved with Thorazine.  KUB stable.  Dehydration, hypernatremia and AKI.  Hydrated with IV fluids, however due to lack of intravascular protein he is third spacing and developing pulmonary edema frequently, explained to the family, given trial of IV albumin and IV fluids but developed pulmonary edema again on 02/07/2022, this had happened earlier on 02/04/2022.  Both had to be stopped, IV Lasix, renal ultrasound stable.  Nephrology consulted, poor candidate for dialysis.  Likely transition to comfort measures.   Recurrent pulmonary edema.  See above.  IV fluids IV albumin held, Lasix IV on 02/07/2022, nonrebreather mask, nebulizer treatments, head of the bed elevated, likely need to transition to comfort care soon.  Acute metabolic encephalopathy in a patient with underlying dementia, extremely frail and deconditioned with advanced age - Secondary to sepsis.  He is pretty deconditioned and frail at baseline, according to the outpatient notes he stopped taking his dementia  medications 4 to 5 months ago, had poor appetite with weight loss ongoing for several weeks.  Was becoming increasingly withdrawn, not eating or drinking at least 7 -10 days prior to hospital admission and required assistance in ambulation at least 4 to 5 days prior to hospital admission.  Overall extremely frail, very high risk for repeated aspiration, poor long-term prognosis.  Continue supportive care.  DNR now.  Hypothyroidism - Not currently on levothyroxine, stable TSH  Dementia without behavioral disturbance - Hold mirtazapine due to lethargy, delirium precautions, minimize narcotics and benzodiazepines.  Mild shortness of breath on 02/01/2022.  Was nonspecific, question of mild fluid overload and nonspecific CHF, no echo on file, nonacute echocardiogram, fluids reduced, symptoms resolved chest x-ray stable continue to monitor.       Condition -  Guarded  Family Communication  : Daughter and wife in detail on 02/01/2022, wife at bedside on 02/02/2022, daughter and wife bedside 02/03/2022 , detailed discussion bedside with daughter, wife and niece on 02/04/2022.  Now DNR & Full comfort care, updated daughter and wife over the phone on 02/08/2022.  Code Status : DNR  Consults  : Palliative care for goals of care  PUD Prophylaxis :    Procedures  :     TTE - 1. Left ventricular ejection fraction, by estimation, is 70 to 75%. The left ventricle has hyperdynamic function. The left ventricle has no regional wall motion abnormalities. Left ventricular diastolic parameters are indeterminate.  2. Right ventricular systolic function is normal. The right ventricular size is normal. Tricuspid regurgitation signal is inadequate for assessing PA pressure.  3. The mitral valve is normal in structure. No evidence of mitral valve regurgitation. No evidence of mitral stenosis.  4. The aortic valve was not well visualized. Aortic valve regurgitation is not visualized. No aortic stenosis is present.  5. The  inferior vena cava is normal in size with <50% respiratory variability, suggesting right atrial pressure of 8 mmHg.  CT head.  Non acute  CT abdomen pelvis -  Unremarkable CT abdomen/pelvis.      Disposition Plan  :    Status is: Inpatient  DVT Prophylaxis  :  Lab Results  Component Value Date   PLT 436 (H) 02/07/2022    Diet :  Diet Order             Diet regular Room service appropriate? Yes; Fluid consistency: Thin  Diet effective now                    Inpatient Medications  Scheduled Meds:  (feeding supplement) PROSource Plus  30 mL Oral BID BM    HYDROmorphone (DILAUDID) injection  1 mg Intravenous Q4H   ketorolac  15 mg Intravenous Q12H   latanoprost  1 drop Both Eyes QHS   LORazepam  1 mg Intravenous Q4H   scopolamine  1 patch Transdermal Q72H   Continuous Infusions:   PRN Meds:.antiseptic oral rinse, glycopyrrolate, HYDROmorphone (DILAUDID) injection, ipratropium-albuterol, LORazepam, ondansetron **OR** ondansetron (ZOFRAN) IV, polyvinyl alcohol, sodium chloride    Objective:   Vitals:   02/07/22 1038 02/07/22 1150 02/07/22 2015 02/08/22 0415  BP: 131/82 129/82 101/69 125/75  Pulse: (!) 109 (!) 107 91 97  Resp: (!) 34 (!) 49 (!) 36 20  Temp:  (!) 101 F (38.3 C)    TempSrc:  Axillary    SpO2:   94%   Weight:      Height:        Wt Readings from Last 3 Encounters:  02/01/22 65.8 kg  01/27/22 65.8 kg  09/24/21 69.3 kg     Intake/Output Summary (Last 24 hours) at 02/08/2022 0820 Last data filed at 02/07/2022 2015 Gross per 24 hour  Intake --  Output 400 ml  Net -400 ml     Physical Exam  Elderly, frail African-American gentleman who is Sleepy appears extremely weak and drained this morning, no focal deficits,   [place].AT,PERRAL Supple Neck, No JVD,   Symmetrical Chest wall movement, coarse bilateral crackles RRR,No Gallops, Rubs or new Murmurs,  +ve B.Sounds, Abd Soft, No tenderness,   No Cyanosis, Clubbing or edema      Data Review:    CBC Recent Labs  Lab 02/03/22 0442 02/04/22 0646 02/05/22 0515 02/06/22 0245 02/07/22 0435  WBC 22.0* 25.3* 25.4* 21.8* 21.8*  HGB 11.7* 11.8* 10.8* 8.5* 8.7*  HCT 33.7* 32.9* 31.1* 25.8* 25.5*  PLT 438* 468* 469* 408* 436*  MCV 87.5 85.5 86.9 90.2 89.2  MCH 30.4 30.6 30.2 29.7 30.4  MCHC 34.7 35.9 34.7 32.9 34.1  RDW 14.2 14.0 14.1 14.2 14.6  LYMPHSABS 1.3 1.0 1.1 0.8 0.8  MONOABS 2.2* 2.1* 1.9* 1.5* 1.0  EOSABS 0.4 0.5 0.3 0.5 0.4  BASOSABS 0.1 0.1 0.1 0.1 0.0    Electrolytes Recent Labs  Lab 02/02/22 0447 02/03/22 0135 02/03/22 0442 02/03/22 0703 02/04/22 0646 02/05/22 0515 02/06/22 0245 02/07/22 0435  NA 141   < > 134*  --  134* 139 136 135  K 3.3*   < > 3.6  --  3.7 3.6 3.0* 3.3*  CL 105   < > 99  --  100 101 100 101  CO2 24   < > 22  --  '24 26 23 23  '$ GLUCOSE 100*   < > 99  --  132* 99 159* 275*  BUN 38*   < > 34*  --  33* 43* 53* 61*  CREATININE 1.47*   < > 1.34*  --  1.36* 1.78* 1.93* 2.68*  CALCIUM 8.6*   < > 8.3*  --  8.2* 8.1* 8.4* 8.5*  MG 2.2  --  2.2  --  2.2 2.3  --  2.4  CRP  --   --   --  35.1* 41.5* 37.0* 25.2* 26.2*  PROCALCITON  --   --   --  17.48 4.70 6.42 6.00 6.52  BNP 195.6*  --  215.1*  --  253.6* 278.6*  --  2,782.3*   < > = values in this interval not displayed.      Radiology Reports  DG Chest Port 1 View  Result Date: 02/07/2022 CLINICAL DATA:  Shortness of breath EXAM: PORTABLE CHEST 1 VIEW COMPARISON:  02/06/2022 FINDINGS: Heart is normal size. Worsening patchy bilateral airspace disease and interstitial prominence. No effusions or pneumothorax. No acute bony abnormality. IMPRESSION: Worsening bilateral patchy airspace disease and interstitial prominence concerning for pneumonia. Electronically Signed   By: Rolm Baptise M.D.   On: 02/07/2022 08:33   US RENAL  Result Date: 02/06/2022 CLINICAL DATA:  Acute kidney injury EXAM: RENAL / URINARY TRACT ULTRASOUND COMPLETE COMPARISON:  None Available. FINDINGS:  Right Kidney: Length = 9.9 cm AP renal pelvis diameter = <10 mm Normal parenchymal echogenicity with preserved corticomedullary differentiation. No urinary tract dilation or shadowing calculi. The ureter is not seen. Left Kidney: Length = 9.5 cm AP renal pelvis diameter = <10 mm Lower pole simple cyst measures 3.2 cm. No follow-up imaging recommended. Normal parenchymal echogenicity with preserved corticomedullary differentiation. No urinary tract dilation or shadowing calculi. The ureter is not seen. Bladder: Decompressed with catheter in-situ. Other: Layering sludge within the gallbladder. No gallbladder mural thickening or pericholecystic free fluid. Left pleural effusion. IMPRESSION: 1.  No urinary tract dilation or shadowing calculi. 2. Gallbladder sludge without evidence of acute cholecystitis. 3. Left pleural effusion. Electronically Signed   By: Darrin Nipper M.D.   On: 02/06/2022 11:07   DG Chest Port 1 View  Result Date: 02/06/2022 CLINICAL DATA:  Shortness of breath. EXAM: PORTABLE CHEST 1 VIEW COMPARISON:  One-view chest x-ray 02/05/2022. FINDINGS: Heart size is normal. Slight increase of interstitial and airspace opacities is noted bilaterally, particularly at the right base. No significant edema or effusions are present. IMPRESSION: Slight increase of interstitial and airspace opacities bilaterally, particularly at the right base. While this may represent atelectasis, infection or aspiration are not excluded. Electronically Signed   By: San Morelle M.D.   On: 02/06/2022 06:38   DG Chest Port 1 View  Result Date: 02/05/2022 CLINICAL DATA:  88 year old male with shortness of breath. EXAM: PORTABLE CHEST 1 VIEW COMPARISON:  Portable chest 02/04/2022 and earlier. FINDINGS: Portable AP semi upright view at 0730 hours. Mildly rotated to the right. Lung volumes and mediastinal contours are stable. Lung markings appear stable. Allowing for portable technique the lungs are clear. No pneumothorax.  Negative visible bowel gas. No acute osseous abnormality identified. IMPRESSION: No acute cardiopulmonary abnormality. Electronically Signed   By: Genevie Ann M.D.   On: 02/05/2022 07:44      Signature  Lala Lund M.D on 02/08/2022 at 8:20 AM   -  To page go to [email or web address]

## 2022-02-09 DIAGNOSIS — R0609 Other forms of dyspnea: Secondary | ICD-10-CM | POA: Diagnosis not present

## 2022-02-09 DIAGNOSIS — R451 Restlessness and agitation: Secondary | ICD-10-CM

## 2022-02-09 DIAGNOSIS — Z515 Encounter for palliative care: Secondary | ICD-10-CM | POA: Diagnosis not present

## 2022-02-09 DIAGNOSIS — F0390 Unspecified dementia without behavioral disturbance: Secondary | ICD-10-CM

## 2022-02-09 DIAGNOSIS — N390 Urinary tract infection, site not specified: Secondary | ICD-10-CM | POA: Diagnosis not present

## 2022-02-09 DIAGNOSIS — A419 Sepsis, unspecified organism: Secondary | ICD-10-CM | POA: Diagnosis not present

## 2022-02-09 NOTE — Progress Notes (Signed)
PROGRESS NOTE                                                                                                                                                                                                             Patient Demographics:    Tommy Browning, is a 88 y.o. male, DOB - January 15, 1930, VQQ:[id number]  Outpatient Primary MD for the patient is Hoyt Koch, MD    LOS - 9  Admit date -     Chief Complaint  Patient presents with   Altered Mental Status       Brief Narrative (HPI from H&P)    88 y.o. male with medical history significant for BPH, hypothyroidism and dementia was brought in by EMS for evaluation of a one-week history of weakness and lethargy with decreased oral intake, and frequent urination that was 'very dark to red".  At baseline patient is ambulant able to participate in ADLs but definitely had a sustained decline, he was less active, not eating well and required assistance in ambulation for several days prior to hospital admission, however there is a sustained pattern of deconditioning, decreased appetite and oral intake for several months. The ER he was diagnosed with urinary retention with sepsis, UTI and toxic encephalopathy and admitted.   Subjective:   Weak frail elderly African-American gentleman coughing, copious secretions in the mouth from time to time, denies any chest pain.   Assessment  & Plan :    Full comfort care now.     Patient who is extremely frail, demented 88 year old gentleman admitted here failure to thrive for several days, getting weak with poor oral intake for several days at home remaining mostly bedbound for the last few days prior to admission, was diagnosed with UTI, was extremely frail and malnourished.  Subsequently developed aspiration pneumonia, AKI.  Due to lack of intravascular protein content and malnutrition any fluid given started giving him  pulmonary edema due to third spacing.  Palliative care was involved.  He was initially made DNR and after reasonable medical treatment was provided he was transitioned to full comfort care on 02/07/2022 afternoon.  He is now getting only comfort medications.  Other medical problems addressed earlier this admission are below.     Sepsis secondary to UTI caused by urinary retention.  Underlying history of BPH. Foley placed in the  ER, on empiric antibiotics, sepsis pathophysiology is improving, add Flomax with outpatient urology follow-up.  Will be discharged with Foley catheter.  Aspiration pneumonia.  High white count, low-grade fevers with rising procalcitonin.  At risk for aspiration, might have early aspiration pneumonia although chest x-ray not revealing, switch antibiotics to Unasyn on 02/03/2022, seen by speech currently n.p.o. except medications, keep head of the bed elevated, aspiration precautions,   Frequent hiccups.  Improved with Thorazine.  KUB stable.  Dehydration, hypernatremia and AKI.  Hydrated with IV fluids, however due to lack of intravascular protein he is third spacing and developing pulmonary edema frequently, explained to the family, given trial of IV albumin and IV fluids but developed pulmonary edema again on 02/07/2022, this had happened earlier on 02/04/2022.  Both had to be stopped, IV Lasix, renal ultrasound stable.  Nephrology consulted, poor candidate for dialysis.  Likely transition to comfort measures.   Recurrent pulmonary edema.  See above.  IV fluids IV albumin held, Lasix IV on 02/07/2022, nonrebreather mask, nebulizer treatments, head of the bed elevated, likely need to transition to comfort care soon.  Acute metabolic encephalopathy in a patient with underlying dementia, extremely frail and deconditioned with advanced age - Secondary to sepsis.  He is pretty deconditioned and frail at baseline, according to the outpatient notes he stopped taking his dementia  medications 4 to 5 months ago, had poor appetite with weight loss ongoing for several weeks.  Was becoming increasingly withdrawn, not eating or drinking at least 7 -10 days prior to hospital admission and required assistance in ambulation at least 4 to 5 days prior to hospital admission.  Overall extremely frail, very high risk for repeated aspiration, poor long-term prognosis.  Continue supportive care.  DNR now.  Hypothyroidism - Not currently on levothyroxine, stable TSH  Dementia without behavioral disturbance - Hold mirtazapine due to lethargy, delirium precautions, minimize narcotics and benzodiazepines.  Mild shortness of breath on 02/01/2022.  Was nonspecific, question of mild fluid overload and nonspecific CHF, no echo on file, nonacute echocardiogram, fluids reduced, symptoms resolved chest x-ray stable continue to monitor.       Condition -  Guarded  Family Communication  : Daughter and wife in detail on 02/01/2022, wife at bedside on 02/02/2022, daughter and wife bedside 02/03/2022 , detailed discussion bedside with daughter, wife and niece on 02/04/2022.  Now DNR & Full comfort care, updated daughter and wife over the phone on 02/08/2022.  Code Status : DNR  Consults  : Palliative care for goals of care  PUD Prophylaxis :    Procedures  :     TTE - 1. Left ventricular ejection fraction, by estimation, is 70 to 75%. The left ventricle has hyperdynamic function. The left ventricle has no regional wall motion abnormalities. Left ventricular diastolic parameters are indeterminate.  2. Right ventricular systolic function is normal. The right ventricular size is normal. Tricuspid regurgitation signal is inadequate for assessing PA pressure.  3. The mitral valve is normal in structure. No evidence of mitral valve regurgitation. No evidence of mitral stenosis.  4. The aortic valve was not well visualized. Aortic valve regurgitation is not visualized. No aortic stenosis is present.  5. The  inferior vena cava is normal in size with <50% respiratory variability, suggesting right atrial pressure of 8 mmHg.  CT head.  Non acute  CT abdomen pelvis -  Unremarkable CT abdomen/pelvis.      Disposition Plan  :    Status is: Inpatient  DVT Prophylaxis  :  Lab Results  Component Value Date   PLT 436 (H) 02/07/2022    Diet :  Diet Order             Diet regular Room service appropriate? Yes; Fluid consistency: Thin  Diet effective now                    Inpatient Medications  Scheduled Meds:  (feeding supplement) PROSource Plus  30 mL Oral BID BM   glycopyrrolate  0.4 mg Intravenous Q4H    HYDROmorphone (DILAUDID) injection  1 mg Intravenous Q4H   ketorolac  15 mg Intravenous Q12H   latanoprost  1 drop Both Eyes QHS   LORazepam  1 mg Intravenous Q4H   scopolamine  1 patch Transdermal Q72H   Continuous Infusions:   PRN Meds:.antiseptic oral rinse, HYDROmorphone (DILAUDID) injection, ipratropium-albuterol, LORazepam, ondansetron **OR** ondansetron (ZOFRAN) IV, polyvinyl alcohol, sodium chloride    Objective:   Vitals:   02/08/22 0800 02/08/22 2021 02/09/22 0050 02/09/22 0440  BP:  117/87  98/71  Pulse: (!) 104 (!) 115 (!) 113 (!) 114  Resp: (!) 28 19 (!) 25 20  Temp:  (!) 102 F (38.9 C) 98.8 F (37.1 C)   TempSrc:  Axillary Axillary   SpO2:  95%    Weight:      Height:        Wt Readings from Last 3 Encounters:  02/01/22 65.8 kg  01/27/22 65.8 kg  09/24/21 69.3 kg     Intake/Output Summary (Last 24 hours) at 02/09/2022 1004 Last data filed at 02/09/2022 0300 Gross per 24 hour  Intake --  Output 800 ml  Net -800 ml     Physical Exam  Elderly, frail African-American gentleman who is Sleepy appears extremely weak and drained this morning, no focal deficits,   [place].AT,PERRAL Supple Neck, No JVD,   Symmetrical Chest wall movement, coarse bilateral crackles RRR,No Gallops, Rubs or new Murmurs,  +ve B.Sounds, Abd Soft, No  tenderness,   No Cyanosis, Clubbing or edema     Data Review:    CBC Recent Labs  Lab 02/03/22 0442 02/04/22 0646 02/05/22 0515 02/06/22 0245 02/07/22 0435  WBC 22.0* 25.3* 25.4* 21.8* 21.8*  HGB 11.7* 11.8* 10.8* 8.5* 8.7*  HCT 33.7* 32.9* 31.1* 25.8* 25.5*  PLT 438* 468* 469* 408* 436*  MCV 87.5 85.5 86.9 90.2 89.2  MCH 30.4 30.6 30.2 29.7 30.4  MCHC 34.7 35.9 34.7 32.9 34.1  RDW 14.2 14.0 14.1 14.2 14.6  LYMPHSABS 1.3 1.0 1.1 0.8 0.8  MONOABS 2.2* 2.1* 1.9* 1.5* 1.0  EOSABS 0.4 0.5 0.3 0.5 0.4  BASOSABS 0.1 0.1 0.1 0.1 0.0    Electrolytes Recent Labs  Lab 02/03/22 0442 02/03/22 0703 02/04/22 0646 02/05/22 0515 02/06/22 0245 02/07/22 0435  NA 134*  --  134* 139 136 135  K 3.6  --  3.7 3.6 3.0* 3.3*  CL 99  --  100 101 100 101  CO2 22  --  '24 26 23 23  '$ GLUCOSE 99  --  132* 99 159* 275*  BUN 34*  --  33* 43* 53* 61*  CREATININE 1.34*  --  1.36* 1.78* 1.93* 2.68*  CALCIUM 8.3*  --  8.2* 8.1* 8.4* 8.5*  MG 2.2  --  2.2 2.3  --  2.4  CRP  --  35.1* 41.5* 37.0* 25.2* 26.2*  PROCALCITON  --  17.48 4.70 6.42 6.00 6.52  BNP 215.1*  --  253.6* 278.6*  --  2,782.3*      Radiology Reports  DG Chest Port 1 View  Result Date: 02/07/2022 CLINICAL DATA:  Shortness of breath EXAM: PORTABLE CHEST 1 VIEW COMPARISON:  02/06/2022 FINDINGS: Heart is normal size. Worsening patchy bilateral airspace disease and interstitial prominence. No effusions or pneumothorax. No acute bony abnormality. IMPRESSION: Worsening bilateral patchy airspace disease and interstitial prominence concerning for pneumonia. Electronically Signed   By: Rolm Baptise M.D.   On: 02/07/2022 08:33   US RENAL  Result Date: 02/06/2022 CLINICAL DATA:  Acute kidney injury EXAM: RENAL / URINARY TRACT ULTRASOUND COMPLETE COMPARISON:  None Available. FINDINGS: Right Kidney: Length = 9.9 cm AP renal pelvis diameter = <10 mm Normal parenchymal echogenicity with preserved corticomedullary differentiation. No urinary  tract dilation or shadowing calculi. The ureter is not seen. Left Kidney: Length = 9.5 cm AP renal pelvis diameter = <10 mm Lower pole simple cyst measures 3.2 cm. No follow-up imaging recommended. Normal parenchymal echogenicity with preserved corticomedullary differentiation. No urinary tract dilation or shadowing calculi. The ureter is not seen. Bladder: Decompressed with catheter in-situ. Other: Layering sludge within the gallbladder. No gallbladder mural thickening or pericholecystic free fluid. Left pleural effusion. IMPRESSION: 1.  No urinary tract dilation or shadowing calculi. 2. Gallbladder sludge without evidence of acute cholecystitis. 3. Left pleural effusion. Electronically Signed   By: Darrin Nipper M.D.   On: 02/06/2022 11:07   DG Chest Port 1 View  Result Date: 02/06/2022 CLINICAL DATA:  Shortness of breath. EXAM: PORTABLE CHEST 1 VIEW COMPARISON:  One-view chest x-ray 02/05/2022. FINDINGS: Heart size is normal. Slight increase of interstitial and airspace opacities is noted bilaterally, particularly at the right base. No significant edema or effusions are present. IMPRESSION: Slight increase of interstitial and airspace opacities bilaterally, particularly at the right base. While this may represent atelectasis, infection or aspiration are not excluded. Electronically Signed   By: San Morelle M.D.   On: 02/06/2022 06:38      Signature  Lala Lund M.D on 02/09/2022 at 10:04 AM   -  To page go to [email or web address]

## 2022-02-09 NOTE — Progress Notes (Signed)
[facility]) Hospital Liaison Note:   Referral received from [place] for [employer].    Met with patient and family at bedside, wife, son and two granddaughters.  Patient was alert but non communicative.  Patient appeared to have labored breathing.  Discussed hospice philosophy and services.  Wife, Derrek Gu, was not open to hospice services at [facility] but was accepting of hospice services at home.  Son stated that he feels that patient would get better care at home and would get better, as he was not this sick when he came into the hospital.  Daughter, Shelp, was also called in given same information and encouraged to speak with family and physicians again about patient's health trajectory.    [facility] has reached back out to PMT and TOC.  [facility] will continue to follow for discharge disposition.    Please reach our with any questions.  Thank you   Kenna Gilbert BSN RN  [facility] Liaison  [phone]

## 2022-02-09 NOTE — TOC Progression Note (Signed)
Transition of Care (TOC) - Progression Note    Patient Details  Name: Tommy Browning. MRN: [id number] Date of Birth: May 16, 1929  Transition of Care [facility]) CM/SW Contact  Cyndi Bender, RN Phone Number: 02/09/2022, 4:32 PM  Clinical Narrative:     Patient's family has a meeting tomorrow am to talk about [place]. Family will decide at that time if they want to take patient home with hospice. [facility] is following patient.   Expected Discharge Plan: [place] Barriers to Discharge: Continued Medical Work up  Expected Discharge Plan and Services Expected Discharge Plan: [place] In-house Referral: Clinical Social Work, Hospice / Palliative Care Discharge Planning Services: CM Consult Post Acute Care Choice: Home Health, Durable Medical Equipment Living arrangements for the past 2 months: Single Family Home                 DME Arranged: Bedside commode DME Agency: [employer] Date DME Agency Contacted: 02/03/22 Time DME Agency Contacted: [phone] Representative spoke with at DME Agency: Myrtha Mantis HH Arranged: RN, PT, OT, Nurse's Aide, Speech Therapy, Social Work [place] Agency: [place] Date [place]: 02/03/22 Time [place]: 1551 Representative spoke with at [place] and [place] for palliative services   Social Determinants of Health ([place]) Interventions    Readmission Risk Interventions     No data to display

## 2022-02-09 NOTE — Progress Notes (Signed)
Patient Tommy Browning.      DOB: June 08, 1929      QBH:[id number]      Palliative Medicine Team    Subjective: Bedside symptom check completed. Four family members bedside at time of visit (wife, son, two granddaughters).    Physical exam: Patient resting in bed at time of visit, talking and praying with family. Breathing even, slightly labored on 9L high flow oxygen, patient able to cough up secretions to [place]. Patient without physical or non-verbal signs of pain at this time. Family assisting him in using suction and repositioning, offering sips of water.   Assessment and plan: This RN discussed the discomfort possibly felt by the patient with high flow oxygen and drying out his throat. Discussed the use of opioid to decrease air hunger, to allow for titration down of oxygen and increased comfort. Family understanding, all questions answered, family in agreement to continue comfort medications after conversation was had, misunderstood this morning with granddaughters bedside as to why certain medications were scheduled/offered. This RN discussed with bedside RN the plan to utilize PRN medications and wean oxygen for comfort, Brittney in agreement with plan. As this RN was leaving room, hospice liaison was entering room to discuss options. This morning, the patient remains stable and appropriate for transfer. Will continue to follow for any changes or advances.    Thank you for allowing the Palliative Medicine Team to assist in the care of this patient.     Damian Leavell, MSN, [place] Palliative Medicine Team Team Phone: [phone]  This phone is monitored 7a-7p, please reach out to attending physician outside of these hours for urgent needs.

## 2022-02-09 NOTE — Progress Notes (Addendum)
Patient Tommy Browning.      DOB: February 16, 1930      UTM:[id number]      Palliative Medicine Team    Subjective: This RN returned to bedside with NP Wadie Lessen to follow up conversation at request of attending Dr. Candiss Norse. At time of entry to room, wife and three other family members were present at the bedside.   Bedside RN Brittney administered medications at beginning of visit, providing symptom relief for the patient. Patient was then resting in bed with eyes closed, not interactive. Breathing even and non-labored with high flow nasal cannula applied, no excessive secretions noted. Patient without physical or non-verbal signs of pain or discomfort after administration. Wife declines chaplain services at this time, she and her husband are both well supported in that area at this time.    NP Wadie Lessen from palliative medicine team discussed with wife the current status of the patient and plan of care. All questions answered. Wife described good insight into the severity of illness that he patient currently exhibits and expresses that in her "best case scenario" that she would like to bring the patient back to their home with hospice services. She also mentioned understanding that the hospice nurses would train the family to administer medications as needed and that extra care giving may be required out of pocket. She wanted to further discuss this plan tomorrow morning with her two children in attendance and the attending MD Dr. Candiss Norse. This RN relayed information to care team, plan to meet tomorrow at [place].   15:15 daughter [place] called the palliative medicine team phone with questions regarding the conversations had today at bedside. This RN shared with her events up til this point and confirmed plan to meet tomorrow morning to further discuss and ensure everyone is on the same page. Daughter plans to attend.   Meeting is scheduled for tomorrow morning at 9 AM  Education offered on hospice  benefit; philosophy and eligibility.  Education offered on the difference between an aggressive medical intervention path and a palliative comfort path for this patient, at this time in this situation.  Education offered on the natural trajectory and expectations at end-of-life.  Education offered on the utilization of medication for symptom management at end-of-life  Patient's wife lovingly shared memories of the they are 63-year marriage.  Both are graduates of [employer].  She describes [name] "A smart man"  educated in math and Arts administrator.    Wadie Lessen NP   Total time spent on the unit 50 minutes    Damian Leavell, MSN, [place] Team Team Phone: [phone]  This phone is monitored 7a-7p, please reach out to attending physician outside of these hours for urgent needs.

## 2022-02-10 DIAGNOSIS — Z515 Encounter for palliative care: Secondary | ICD-10-CM | POA: Diagnosis not present

## 2022-02-10 DIAGNOSIS — G9341 Metabolic encephalopathy: Secondary | ICD-10-CM | POA: Diagnosis not present

## 2022-02-10 DIAGNOSIS — N179 Acute kidney failure, unspecified: Secondary | ICD-10-CM | POA: Diagnosis not present

## 2022-02-10 DIAGNOSIS — F0390 Unspecified dementia without behavioral disturbance: Secondary | ICD-10-CM | POA: Diagnosis not present

## 2022-02-10 DIAGNOSIS — A419 Sepsis, unspecified organism: Secondary | ICD-10-CM | POA: Diagnosis not present

## 2022-02-10 NOTE — Progress Notes (Signed)
Daily Progress Note   Patient Name: Tommy Browning.       Date: 02/10/2022 DOB: 07-19-1929  Age: 88 y.o. MRN#: [id number] Attending Physician: Thurnell Lose, MD Primary Care Physician: Hoyt Koch, MD Admit Date: 02/16/2022  Reason for Consultation/Follow-up: Establishing goals of care  Patient Profile/HPI:   88 y.o. male  with past medical history of mild dementia, hypothyroid, BPH admitted on 02/20/2022 with sepsis due to UTI likely related to urinary retention from BPH.  He has a Foley in place.  He has had some transient shortness of breath with given dose of Lasix.  Chest x-ray was clear.  Echocardiogram is pending.  Palliative medicine consulted for 88 yr old, frail with some dementia, Uro sepsis, GOC. His kidney function is worsening, refractory to IV fluids, lasix. Having signs of pulmonary edema, third spacing. Transitioned to comfort measures only on 11/11 by Sharyn Lull, NP Palliative.   Subjective:  Patient is lethargic, moaning at times. Continued secretions.  Met with patient's daughter, spouse, son, granddaughter, Dr. Candiss Norse and Damian Leavell, RN from Palliative.  Dr. Candiss Norse reviewed Tommy Browning's medical course and decline.  Answered family's questions regarding treatment plan and comfort measures.  Discussed that patient is dying , treatment options have been exhausted and further aggressive medical interventions would cause more harm. Shared prognosis of likely days-weeks (more likely days).  Family shared patient's story of being active, intelligent, valuing education.  At close all agreed that continued comfort measures are best aligned with patient's values.  Family asked questions regarding hospice services at home vs inpatient hospice. They requested that hospice  liaison return to speak with them.    Review of Systems  Unable to perform ROS: Mental status change     Physical Exam Vitals and nursing note reviewed.  Constitutional:      Comments: Lethargic, frail  Cardiovascular:     Rate and Rhythm: Normal rate.  Pulmonary:     Effort: Pulmonary effort is normal.  Neurological:     Motor: Weakness present.     Comments: Lethargic             Vital Signs: BP 98/65 (BP Location: Right Arm)   Pulse (!) 105   Temp 99.3 F (37.4 C) (Oral)   Resp (!) 21   Ht _0  (1.753  m)   Wt 65.8 kg   SpO2 100%   BMI 21.41 kg/m  SpO2: SpO2: 100 % O2 Device: O2 Device: High Flow Nasal Cannula O2 Flow Rate: O2 Flow Rate (L/min): 10 L/min  Intake/output summary:  Intake/Output Summary (Last 24 hours) at 02/10/2022 1038 Last data filed at 02/09/2022 2030 Gross per 24 hour  Intake --  Output 1400 ml  Net -1400 ml    LBM: Last BM Date :  (PTA per pt and family report) Baseline Weight: Weight: 68 kg Most recent weight: Weight: 65.8 kg       Palliative Assessment/Data: PPS: 10%      Patient Active Problem List   Diagnosis Date Noted   Acute urinary retention 02/06/2022   Sepsis secondary to UTI ([place]) 01/29/2022   Dementia without behavioral disturbance ([place]) 23/76/2831   Acute metabolic encephalopathy 51/76/1607   Hypothyroidism 02/13/2022   AKI (acute kidney injury) ([place]) 02/05/2022   Nausea 05/31/2020   Memory disorder 05/01/2016   Loss of weight 07/23/2015   Graves' eye disease 04/16/2011   Routine general medical examination at a health care facility 01/27/2011   HYPOGONADISM 02/03/2010   Hyperlipidemia 05/30/2009   BPH (benign prostatic hyperplasia) 02/26/2009   ELECTROCARDIOGRAM, ABNORMAL 12/08/2006    Palliative Care Assessment & Plan    Assessment/Recommendations/Plan Continue current comfort interventions Contacted hospice liaison to return and discuss hospice options with family  Code Status: DNR  Prognosis:   < 2 weeks   Discharge Planning: To Be Determined  Care plan was discussed with family   Thank you for allowing the Palliative Medicine Team to assist in the care of this patient.  Total time: 120 minutes  Greater than 50%  of this time was spent counseling and coordinating care related to the above assessment and plan.  Mariana Kaufman, AGNP-C Palliative Medicine   Please contact Palliative Medicine Team phone at [phone] for questions and concerns.

## 2022-02-10 NOTE — TOC Progression Note (Addendum)
Transition of Care (TOC) - Progression Note    Patient Details  Name: Tommy Browning. MRN: [id number] Date of Birth: Nov 15, 1929  Transition of Care [facility]) CM/SW Contact  Carles Collet, RN Phone Number: 02/10/2022, 12:18 PM  Clinical Narrative:     Reached out to [facility] per consult for them to discuss dispo options.  Per [facility] liaison, family is in agreement to take this patient home with hospice. They will need to clean out a room to accept DME. [facility] will request tomorrow for DME delivery for discharge Thursday to accommodate DME and family preference.  Patient will need PTAR transport CM notified MD   Expected Discharge Plan: [place] Barriers to Discharge: Continued Medical Work up  Expected Discharge Plan and Services Expected Discharge Plan: [place] In-house Referral: Clinical Social Work, Hospice / Palliative Care Discharge Planning Services: CM Consult Post Acute Care Choice: Home Health, Durable Medical Equipment Living arrangements for the past 2 months: Single Family Home                 DME Arranged: Bedside commode DME Agency: [employer] Date DME Agency Contacted: 02/03/22 Time DME Agency Contacted: [phone] Representative spoke with at DME Agency: Myrtha Mantis HH Arranged: RN, PT, OT, Nurse's Aide, Speech Therapy, Social Work [place] Agency: [place] Date [place]: 02/03/22 Time [place]: 1551 Representative spoke with at [place] and [place] for palliative services   Social Determinants of Health ([place]) Interventions    Readmission Risk Interventions     No data to display

## 2022-02-10 NOTE — Progress Notes (Signed)
[place] [address] [facility]) Hospital Liaison Note  Met with wife Tommy Browning, daughter Tommy Browning and son and granddaughter at bedside and then in conference room. Initiated education related to hospice philosphy, services and the team approach to care. Family verbalized understanding of the information given.   Per discussion, plan is for DME delivery tomorrow, requesting hospital bed with 1/2 rails, OBT and O2 at 10 lpm HFNC. The address has been verified and is correct in the chart. [place] is the person to contact for DME delivery at [phone].  Family requests discharge home Thursday 2022/03/02 by PTAR.  Please send completed and signed DNR home with patient/family.  Please provide prescriptions at discharge as needed to ensure ongoing symptom management.  [facility] contact information given to family. MD, PMT and TOC updated with plan.  Please call with any hospice related questions or concerns.  Thank you, Margaretmary Eddy, BSN, RN [facility] Liaison  [phone]

## 2022-02-10 NOTE — Progress Notes (Signed)
PROGRESS NOTE                                                                                                                                                                                                             Patient Demographics:    Tommy Browning, is a 88 y.o. male, DOB - 1929/04/11, LKG:[id number]  Outpatient Primary MD for the patient is Hoyt Koch, MD    LOS - 10  Admit date - 02/07/2022    Chief Complaint  Patient presents with   Altered Mental Status       Brief Narrative (HPI from H&P)    88 y.o. male with medical history significant for BPH, hypothyroidism and dementia was brought in by EMS for evaluation of a one-week history of weakness and lethargy with decreased oral intake, and frequent urination that was 'very dark to red".  At baseline patient is ambulant able to participate in ADLs but definitely had a sustained decline, he was less active, not eating well and required assistance in ambulation for several days prior to hospital admission, however there is a sustained pattern of deconditioning, decreased appetite and oral intake for several months. The ER he was diagnosed with urinary retention with sepsis, UTI and toxic encephalopathy and admitted.   Subjective:   Weak frail elderly African-American gentleman coughing, copious secretions in the mouth from time to time, denies any chest pain.   Assessment  & Plan :    Full comfort care now.     Patient who is extremely frail, demented 88 year old gentleman admitted here failure to thrive for several days, getting weak with poor oral intake for several days at home remaining mostly bedbound for the last few days prior to admission, was diagnosed with UTI, was extremely frail and malnourished.  Subsequently developed aspiration pneumonia, AKI.  Due to lack of intravascular protein content and malnutrition any fluid given started giving him  pulmonary edema due to third spacing.  Palliative care was involved.  He was initially made DNR and after reasonable medical treatment was provided he was transitioned to full comfort care on 02/07/2022 afternoon.  He is now getting only comfort medications.  Other medical problems addressed earlier this admission are below, likely will transition to residential hospice in the next 1 to 2 days.  Family to decide.  Sepsis secondary to UTI caused by urinary retention.  Underlying history of BPH. Foley placed in the ER, on empiric antibiotics, sepsis pathophysiology is improving, add Flomax with outpatient urology follow-up.  Will be discharged with Foley catheter.  Aspiration pneumonia.  High white count, low-grade fevers with rising procalcitonin.  At risk for aspiration, might have early aspiration pneumonia although chest x-ray not revealing, switch antibiotics to Unasyn on 02/03/2022, seen by speech currently n.p.o. except medications, keep head of the bed elevated, aspiration precautions,   Frequent hiccups.  Improved with Thorazine.  KUB stable.  Dehydration, hypernatremia and AKI.  Hydrated with IV fluids, however due to lack of intravascular protein he is third spacing and developing pulmonary edema frequently, explained to the family, given trial of IV albumin and IV fluids but developed pulmonary edema again on 02/07/2022, this had happened earlier on 02/04/2022.  Both had to be stopped, IV Lasix, renal ultrasound stable.  Nephrology consulted, poor candidate for dialysis.  Likely transition to comfort measures.   Recurrent pulmonary edema.  See above.  IV fluids IV albumin held, Lasix IV on 02/07/2022, nonrebreather mask, nebulizer treatments, head of the bed elevated, likely need to transition to comfort care soon.  Acute metabolic encephalopathy in a patient with underlying dementia, extremely frail and deconditioned with advanced age - Secondary to sepsis.  He is pretty deconditioned and  frail at baseline, according to the outpatient notes he stopped taking his dementia medications 4 to 5 months ago, had poor appetite with weight loss ongoing for several weeks.  Was becoming increasingly withdrawn, not eating or drinking at least 7 -10 days prior to hospital admission and required assistance in ambulation at least 4 to 5 days prior to hospital admission.  Overall extremely frail, very high risk for repeated aspiration, poor long-term prognosis.  Continue supportive care.  DNR now.  Hypothyroidism - Not currently on levothyroxine, stable TSH  Dementia without behavioral disturbance - Hold mirtazapine due to lethargy, delirium precautions, minimize narcotics and benzodiazepines.  Mild shortness of breath on 02/01/2022.  Was nonspecific, question of mild fluid overload and nonspecific CHF, no echo on file, nonacute echocardiogram, fluids reduced, symptoms resolved chest x-ray stable continue to monitor.       Condition -  Guarded  Family Communication  : Daughter and wife in detail on 02/01/2022, wife at bedside on 02/02/2022, daughter and wife bedside 02/03/2022 , detailed discussion bedside with daughter, wife and niece on 02/04/2022.  Now DNR & Full comfort care, updated daughter and wife over the phone on 02/08/2022.  Daughter and wife updated bedside 02/06/2022, 02/07/2022, 02/08/2022, granddaughter updated bedside 02/09/2022, daughter, wife, son, granddaughter updated with palliative care team on 02/10/2022  Code Status : DNR  Consults  : Palliative care for goals of care  PUD Prophylaxis :    Procedures  :     TTE - 1. Left ventricular ejection fraction, by estimation, is 70 to 75%. The left ventricle has hyperdynamic function. The left ventricle has no regional wall motion abnormalities. Left ventricular diastolic parameters are indeterminate.  2. Right ventricular systolic function is normal. The right ventricular size is normal. Tricuspid regurgitation signal is inadequate  for assessing PA pressure.  3. The mitral valve is normal in structure. No evidence of mitral valve regurgitation. No evidence of mitral stenosis.  4. The aortic valve was not well visualized. Aortic valve regurgitation is not visualized. No aortic stenosis is present.  5. The inferior vena cava is normal in size with <50% respiratory  variability, suggesting right atrial pressure of 8 mmHg.  CT head.  Non acute  CT abdomen pelvis -  Unremarkable CT abdomen/pelvis.      Disposition Plan  :    Status is: Inpatient  DVT Prophylaxis  :        Lab Results  Component Value Date   PLT 436 (H) 02/07/2022    Diet :  Diet Order             Diet regular Room service appropriate? Yes; Fluid consistency: Thin  Diet effective now                    Inpatient Medications  Scheduled Meds:  (feeding supplement) PROSource Plus  30 mL Oral BID BM   glycopyrrolate  0.4 mg Intravenous Q4H    HYDROmorphone (DILAUDID) injection  1 mg Intravenous Q4H   ketorolac  15 mg Intravenous Q12H   latanoprost  1 drop Both Eyes QHS   LORazepam  1 mg Intravenous Q4H   scopolamine  1 patch Transdermal Q72H   Continuous Infusions:   PRN Meds:.antiseptic oral rinse, HYDROmorphone (DILAUDID) injection, ipratropium-albuterol, LORazepam, ondansetron **OR** ondansetron (ZOFRAN) IV, polyvinyl alcohol, sodium chloride    Objective:   Vitals:   02/09/22 2017 02/10/22 0015 02/10/22 0428 02/10/22 0837  BP: 113/74 96/78 112/72 98/65  Pulse: (!) 102 (!) 103 (!) 108 (!) 105  Resp: (!) '21 19 18 '$ (!) 21  Temp: 99.4 F (37.4 C) 99.4 F (37.4 C) 99.8 F (37.7 C) 99.3 F (37.4 C)  TempSrc: Axillary Axillary Oral Oral  SpO2: 100% 100% 100%   Weight:      Height:        Wt Readings from Last 3 Encounters:  02/01/22 65.8 kg  01/27/22 65.8 kg  09/24/21 69.3 kg     Intake/Output Summary (Last 24 hours) at 02/10/2022 1038 Last data filed at 02/09/2022 2030 Gross per 24 hour  Intake --  Output 1400  ml  Net -1400 ml     Physical Exam  Elderly, frail African-American gentleman who is Sleepy appears extremely weak and drained this morning, no focal deficits,   [place].AT,PERRAL Supple Neck, No JVD,   Symmetrical Chest wall movement, coarse bilateral crackles RRR,No Gallops, Rubs or new Murmurs,  +ve B.Sounds, Abd Soft, No tenderness,   No Cyanosis, Clubbing or edema     Data Review:    CBC Recent Labs  Lab 02/04/22 0646 02/05/22 0515 02/06/22 0245 02/07/22 0435  WBC 25.3* 25.4* 21.8* 21.8*  HGB 11.8* 10.8* 8.5* 8.7*  HCT 32.9* 31.1* 25.8* 25.5*  PLT 468* 469* 408* 436*  MCV 85.5 86.9 90.2 89.2  MCH 30.6 30.2 29.7 30.4  MCHC 35.9 34.7 32.9 34.1  RDW 14.0 14.1 14.2 14.6  LYMPHSABS 1.0 1.1 0.8 0.8  MONOABS 2.1* 1.9* 1.5* 1.0  EOSABS 0.5 0.3 0.5 0.4  BASOSABS 0.1 0.1 0.1 0.0    Electrolytes Recent Labs  Lab 02/04/22 0646 02/05/22 0515 02/06/22 0245 02/07/22 0435  NA 134* 139 136 135  K 3.7 3.6 3.0* 3.3*  CL 100 101 100 101  CO2 '24 26 23 23  '$ GLUCOSE 132* 99 159* 275*  BUN 33* 43* 53* 61*  CREATININE 1.36* 1.78* 1.93* 2.68*  CALCIUM 8.2* 8.1* 8.4* 8.5*  MG 2.2 2.3  --  2.4  CRP 41.5* 37.0* 25.2* 26.2*  PROCALCITON 4.70 6.42 6.00 6.52  BNP 253.6* 278.6*  --  2,782.3*      Radiology Reports  DG Chest  Port 1 View  Result Date: 02/07/2022 CLINICAL DATA:  Shortness of breath EXAM: PORTABLE CHEST 1 VIEW COMPARISON:  02/06/2022 FINDINGS: Heart is normal size. Worsening patchy bilateral airspace disease and interstitial prominence. No effusions or pneumothorax. No acute bony abnormality. IMPRESSION: Worsening bilateral patchy airspace disease and interstitial prominence concerning for pneumonia. Electronically Signed   By: Rolm Baptise M.D.   On: 02/07/2022 08:33   US RENAL  Result Date: 02/06/2022 CLINICAL DATA:  Acute kidney injury EXAM: RENAL / URINARY TRACT ULTRASOUND COMPLETE COMPARISON:  None Available. FINDINGS: Right Kidney: Length = 9.9 cm AP renal  pelvis diameter = <10 mm Normal parenchymal echogenicity with preserved corticomedullary differentiation. No urinary tract dilation or shadowing calculi. The ureter is not seen. Left Kidney: Length = 9.5 cm AP renal pelvis diameter = <10 mm Lower pole simple cyst measures 3.2 cm. No follow-up imaging recommended. Normal parenchymal echogenicity with preserved corticomedullary differentiation. No urinary tract dilation or shadowing calculi. The ureter is not seen. Bladder: Decompressed with catheter in-situ. Other: Layering sludge within the gallbladder. No gallbladder mural thickening or pericholecystic free fluid. Left pleural effusion. IMPRESSION: 1.  No urinary tract dilation or shadowing calculi. 2. Gallbladder sludge without evidence of acute cholecystitis. 3. Left pleural effusion. Electronically Signed   By: Darrin Nipper M.D.   On: 02/06/2022 11:07      Signature  Lala Lund M.D on 02/10/2022 at 10:38 AM   -  To page go to [email or web address]

## 2022-02-11 DIAGNOSIS — A419 Sepsis, unspecified organism: Secondary | ICD-10-CM | POA: Diagnosis not present

## 2022-02-11 DIAGNOSIS — F03B0 Unspecified dementia, moderate, without behavioral disturbance, psychotic disturbance, mood disturbance, and anxiety: Secondary | ICD-10-CM

## 2022-02-11 DIAGNOSIS — R627 Adult failure to thrive: Secondary | ICD-10-CM | POA: Diagnosis not present

## 2022-02-11 DIAGNOSIS — R350 Frequency of micturition: Secondary | ICD-10-CM

## 2022-02-11 DIAGNOSIS — N390 Urinary tract infection, site not specified: Secondary | ICD-10-CM | POA: Diagnosis not present

## 2022-02-11 DIAGNOSIS — N401 Enlarged prostate with lower urinary tract symptoms: Secondary | ICD-10-CM

## 2022-02-12 DIAGNOSIS — E870 Hyperosmolality and hypernatremia: Secondary | ICD-10-CM | POA: Insufficient documentation

## 2022-02-12 DIAGNOSIS — J690 Pneumonitis due to inhalation of food and vomit: Secondary | ICD-10-CM | POA: Insufficient documentation

## 2022-02-17 ENCOUNTER — Ambulatory Visit: Payer: Medicare PPO | Admitting: Internal Medicine

## 2022-02-19 ENCOUNTER — Other Ambulatory Visit: Payer: Self-pay | Admitting: Internal Medicine

## 2022-02-27 NOTE — Progress Notes (Signed)
PROGRESS NOTE                                                                                                                                                                                                             Patient Demographics:    Tommy Browning, is a 88 y.o. male, DOB - [birth year], ENI:[id number]  Outpatient Primary MD for the patient is Hoyt Koch, MD    LOS - 11  Admit date - 02/24/2022    Chief Complaint  Patient presents with   Altered Mental Status       Brief Narrative (HPI from H&P)     88 y.o. male with medical history significant for BPH, hypothyroidism and dementia was brought in by EMS for evaluation of a one-week history of weakness and lethargy with decreased oral intake, and frequent urination that was 'very dark to red".  At baseline patient is ambulant able to participate in ADLs but definitely had a sustained decline, he was less active, not eating well and required assistance in ambulation for several days prior to hospital admission, however there is a sustained pattern of deconditioning, decreased appetite and oral intake for several months. The ER he was diagnosed with urinary retention with sepsis, UTI and toxic encephalopathy and admitted.   Subjective:   No significant events overnight as discussed with staff, he is obtundent, unable to provide any complaints    Assessment  & Plan :    Full comfort care now.  Anticipated discharge home tomorrow with home hospice.   Patient who is extremely frail, demented 88 year old gentleman admitted here failure to thrive for several days, getting weak with poor oral intake for several days at home remaining mostly bedbound for the last few days prior to admission, was diagnosed with UTI, was extremely frail and malnourished.  Subsequently developed aspiration pneumonia, AKI.  Due to lack of intravascular protein content and malnutrition any  fluid given started giving him pulmonary edema due to third spacing.  Palliative care was involved.  He was initially made DNR and after reasonable medical treatment was provided he was transitioned to full comfort care on 02/07/2022 afternoon.  He is now getting only comfort medications.  Other medical problems addressed earlier this admission are below, and is currently full comfort measure, followed by hospice agency with plan to discharge on Thursday  Sepsis secondary to UTI caused by urinary retention.  Underlying history of BPH. Foley placed in the ER, on empiric antibiotics, sepsis pathophysiology is improving, add Flomax with outpatient urology follow-up.  Will be discharged with Foley catheter.  Aspiration pneumonia.  High white count, low-grade fevers with rising procalcitonin.  At risk for aspiration, might have early aspiration pneumonia although chest x-ray not revealing, switch antibiotics to Unasyn on 02/03/2022, seen by speech currently n.p.o. except medications, keep head of the bed elevated, aspiration precautions,   Frequent hiccups.  Improved with Thorazine.  KUB stable.  Dehydration, hypernatremia and AKI.  Hydrated with IV fluids, however due to lack of intravascular protein he is third spacing and developing pulmonary edema frequently, explained to the family, given trial of IV albumin and IV fluids but developed pulmonary edema again on 02/07/2022, this had happened earlier on 02/04/2022.  Both had to be stopped, IV Lasix, renal ultrasound stable.  Nephrology consulted, poor candidate for dialysis.  Likely transition to comfort measures.   Recurrent pulmonary edema.  See above.  IV fluids IV albumin held, Lasix IV on 02/07/2022, nonrebreather mask, nebulizer treatments, head of the bed elevated, likely need to transition to comfort care soon.  Acute metabolic encephalopathy in a patient with underlying dementia, extremely frail and deconditioned with advanced age - Secondary to  sepsis.  He is pretty deconditioned and frail at baseline, according to the outpatient notes he stopped taking his dementia medications 4 to 5 months ago, had poor appetite with weight loss ongoing for several weeks.  Was becoming increasingly withdrawn, not eating or drinking at least 7 -10 days prior to hospital admission and required assistance in ambulation at least 4 to 5 days prior to hospital admission.  Overall extremely frail, very high risk for repeated aspiration, poor long-term prognosis.  Comfort care  now.  Hypothyroidism - Not currently on levothyroxine, stable TSH  Dementia without behavioral disturbance - Hold mirtazapine due to lethargy, delirium precautions, minimize narcotics and benzodiazepines.  Mild shortness of breath on 02/01/2022.  Was nonspecific, question of mild fluid overload and nonspecific CHF, no echo on file, nonacute echocardiogram, fluids reduced, symptoms resolved chest x-ray stable continue to monitor.       Condition -  Guarded  Family Communication  : None at bedside this am  Code Status : DNR  Consults  : Palliative care for goals of care  PUD Prophylaxis :    Procedures  :     TTE - 1. Left ventricular ejection fraction, by estimation, is 70 to 75%. The left ventricle has hyperdynamic function. The left ventricle has no regional wall motion abnormalities. Left ventricular diastolic parameters are indeterminate.  2. Right ventricular systolic function is normal. The right ventricular size is normal. Tricuspid regurgitation signal is inadequate for assessing PA pressure.  3. The mitral valve is normal in structure. No evidence of mitral valve regurgitation. No evidence of mitral stenosis.  4. The aortic valve was not well visualized. Aortic valve regurgitation is not visualized. No aortic stenosis is present.  5. The inferior vena cava is normal in size with <50% respiratory variability, suggesting right atrial pressure of 8 mmHg.  CT head.  Non  acute  CT abdomen pelvis -  Unremarkable CT abdomen/pelvis.      Disposition Plan  :    Status is: Inpatient  DVT Prophylaxis  :        Lab Results  Component Value Date   PLT 436 (H) 02/07/2022  Diet :  Diet Order             Diet regular Room service appropriate? Yes; Fluid consistency: Thin  Diet effective now                    Inpatient Medications  Scheduled Meds:  glycopyrrolate  0.4 mg Intravenous Q4H    HYDROmorphone (DILAUDID) injection  1 mg Intravenous Q4H   latanoprost  1 drop Both Eyes QHS   LORazepam  1 mg Intravenous Q4H   scopolamine  1 patch Transdermal Q72H   Continuous Infusions:   PRN Meds:.antiseptic oral rinse, HYDROmorphone (DILAUDID) injection, LORazepam, ondansetron **OR** ondansetron (ZOFRAN) IV, polyvinyl alcohol, sodium chloride    Objective:   Vitals:   02/10/22 0837 02/10/22 1800  0500  0800  BP: 98/65 (!) '88/61 98/69 95/63 '$  Pulse: (!) 105 (!) 115 (!) 119 (!) 118  Resp: (!) 21 (!) 24 20 (!) 25  Temp: 99.3 F (37.4 C) 98.8 F (37.1 C) (!) 102.1 F (38.9 C) 100.1 F (37.8 C)  TempSrc: Oral Oral Axillary Axillary  SpO2:      Weight:      Height:        Wt Readings from Last 3 Encounters:  02/01/22 65.8 kg  01/27/22 65.8 kg  09/24/21 69.3 kg     Intake/Output Summary (Last 24 hours) at  1036 Last data filed at  0500 Gross per 24 hour  Intake 8 ml  Output 1350 ml  Net -1342 ml     Physical Exam  Frail, chronically ill-appearing male, laying in bed, comfortable in no apparent distress Diminished air entry at the bases, in no distress Tachycardic Extremities with no cyanosis     Data Review:    CBC Recent Labs  Lab 02/05/22 0515 02/06/22 0245 02/07/22 0435  WBC 25.4* 21.8* 21.8*  HGB 10.8* 8.5* 8.7*  HCT 31.1* 25.8* 25.5*  PLT 469* 408* 436*  MCV 86.9 90.2 89.2  MCH 30.2 29.7 30.4  MCHC 34.7 32.9 34.1  RDW 14.1 14.2 14.6  LYMPHSABS 1.1 0.8 0.8   MONOABS 1.9* 1.5* 1.0  EOSABS 0.3 0.5 0.4  BASOSABS 0.1 0.1 0.0    Electrolytes Recent Labs  Lab 02/05/22 0515 02/06/22 0245 02/07/22 0435  NA 139 136 135  K 3.6 3.0* 3.3*  CL 101 100 101  CO2 '26 23 23  '$ GLUCOSE 99 159* 275*  BUN 43* 53* 61*  CREATININE 1.78* 1.93* 2.68*  CALCIUM 8.1* 8.4* 8.5*  MG 2.3  --  2.4  CRP 37.0* 25.2* 26.2*  PROCALCITON 6.42 6.00 6.52  BNP 278.6*  --  2,782.3*      Radiology Reports  No results found.    Signature  Phillips Climes M.D on  at 10:36 AM   -  To page go to [email or web address]

## 2022-02-27 NOTE — Progress Notes (Signed)
Daily Progress Note   Patient Name: Tommy Browning.       Date:  DOB: 12/05/1929  Age: 88 y.o. MRN#: [id number] Attending Physician: Albertine Patricia, MD Primary Care Physician: Hoyt Koch, MD Admit Date:   Reason for Consultation/Follow-up: Establishing goals of care  Patient Profile/HPI:   88 y.o. male  with past medical history of mild dementia, hypothyroid, BPH admitted on 02/20/2022 with sepsis due to UTI likely related to urinary retention from BPH.  He has a Foley in place.  He has had some transient shortness of breath with given dose of Lasix.  Chest x-ray was clear.  Echocardiogram is pending.  Palliative medicine consulted for 88 yr old, frail with some dementia, Uro sepsis, GOC. His kidney function is worsening, refractory to IV fluids, lasix. Having signs of pulmonary edema, third spacing. Transitioned to comfort measures only on 11/11 by Sharyn Lull, NP Palliative.   Subjective:  Chart reviewed including labs, progress notes, imaging from this and previous encounters.  Continues on comfort measures. Did not receive any prn medication in the last 24 hours.  Spouse, son, granddaughter, and niece are at bedside.  Tommy Browning does not respond to my voice or touch. Breathing appears comfortable. Radial pulses strong. Extremities warm.  Equipment has been delivered and they are hopeful for discharge home tomorrow.  We discussed that when he discharges we will transition his medication to a concentrated liquid that can absorb in his cheek area and he won't need to swallow.   Review of Systems  Unable to perform ROS: Mental status change     Physical Exam Vitals and nursing note reviewed.  Constitutional:      Comments: Lethargic, frail  Cardiovascular:      Rate and Rhythm: Normal rate.  Pulmonary:     Effort: Pulmonary effort is normal.  Neurological:     Motor: Weakness present.     Comments: Lethargic             Vital Signs: BP 97/61 (BP Location: Right Arm)   Pulse (!) 122   Temp 100.1 F (37.8 C) (Axillary)   Resp (!) 26   Ht '5\' 9"'$  (1.753 m)   Wt 65.8 kg   SpO2 100%   BMI 21.41 kg/m  SpO2: SpO2: 100 % O2 Device: O2 Device:  High Flow Nasal Cannula O2 Flow Rate: O2 Flow Rate (L/min): 10 L/min  Intake/output summary:  Intake/Output Summary (Last 24 hours) at  1546 Last data filed at  0500 Gross per 24 hour  Intake 8 ml  Output 800 ml  Net -792 ml    LBM: Last BM Date :  (PTA per pt and family report) Baseline Weight: Weight: 68 kg Most recent weight: Weight: 65.8 kg       Palliative Assessment/Data: PPS: 10%      Patient Active Problem List   Diagnosis Date Noted   Acute urinary retention 02/23/2022   Sepsis secondary to UTI ([place]) 02/14/2022   Dementia without behavioral disturbance ([place]) 95/62/1308   Acute metabolic encephalopathy 65/78/4696   Hypothyroidism 02/04/2022   AKI (acute kidney injury) ([place]) 02/25/2022   Nausea 05/31/2020   Memory disorder 05/01/2016   Loss of weight 07/23/2015   Graves' eye disease 04/16/2011   Routine general medical examination at a health care facility 01/27/2011   HYPOGONADISM 02/03/2010   Hyperlipidemia 05/30/2009   BPH (benign prostatic hyperplasia) 02/26/2009   ELECTROCARDIOGRAM, ABNORMAL 12/08/2006    Palliative Care Assessment & Plan    Assessment/Recommendations/Plan Continue current comfort interventions Recommend d/c home with:  On discharge, would recommend scripts for: - Oxycodone Concentrate '10mg'$ /0.86m: '10mg'$  (0.533m sublingual every 4 hours for shortness of breath: disp 3083mOxycodone concentrate '10mg'$ /0.5mL51m'10mg'N$  (0.5mL)54mery one hour as needed for pain or shortness of breath: Disp 30mL 25mrazepam '2mg'$ /ml concentrated solution:  '1mg'$  (0.5ml) s49mingual every 4 hours as needed for anxiety: Disp 30ml - 39mol '2mg'$ /ml solution: 0.'5mg'$  (0.25ml) su77mgual every 4 hours as needed for agitation or nausea: Disp 30ml    C80mStatus: DNR  Prognosis:  < 2 weeks   Discharge Planning: To Be Determined  Care plan was discussed with family   Thank you for allowing the Palliative Medicine Team to assist in the care of this patient.   Greater than 50%  of this time was spent counseling and coordinating care related to the above assessment and plan.  Krisanne Lich MahaMariana Kaufmanalliative Medicine   Please contact Palliative Medicine Team phone at [phone] and concerns.

## 2022-02-27 NOTE — Death Summary Note (Signed)
DEATH SUMMARY   Patient Details  Name: Tommy Browning. MRN: [id number] DOB: Sep 28, 1929 KPT:[id number], Real Cons, MD Admission/Discharge Information   Admit Date:  2022/02/15  Date of Death: Date of Death: 02-26-22  Time of Death: Time of Death: 06-25-2311  Length of Stay: 06/24/2022   Principle Cause of death:  Sepsis secondary to UTI    Hospital Diagnoses: Principal Problem:   Sepsis secondary to UTI [facility]) Active Problems:   End of life care    AKI (acute kidney injury) ([place])     Acute  respiratory failure with hpoxia   Acute urinary retention   Acute metabolic encephalopathy   BPH (benign prostatic hyperplasia)   Hyperlipidemia   Aspiration pneumonia (HCC)   Dementia without behavioral disturbance (HCC)   Hypothyroidism   Aspiration pneumonia ([place])   Hypernatremia   Hospital Course:  88 y.o. male with medical history significant for BPH, hypothyroidism and dementia was brought in by EMS for evaluation of a one-week history of weakness and lethargy with decreased oral intake, and frequent urination that was 'very dark to red". admitted here failure to thrive for several days, getting weak with poor oral intake for several days at home remaining mostly bedbound for the last few days prior to admission, was diagnosed with UTI, was extremely frail and malnourished.  Patient was appropriately treated with IV antibiotics, he was extremely frail, he ubsequently developed aspiration pneumonia, AKI.  Due to lack of intravascular protein content and malnutrition any fluid given started giving him pulmonary edema due to third spacing ,He was initially made DNR , despite no improvement after reasonable medical management, palliative medicine were involved, and they discussed with the patient, given overall poor prognosis, dementia, patient was transitioned to full comfort measure, patient terms were managed by palliative medicine, and he passed away on 2022-02-26.       Consultations:  Palliaitve   The results of significant diagnostics from this hospitalization (including imaging, microbiology, ancillary and laboratory) are listed below for reference.   Significant Diagnostic Studies: DG Chest Port 1 View  Result Date: 02/07/2022 CLINICAL DATA:  Shortness of breath EXAM: PORTABLE CHEST 1 VIEW COMPARISON:  02/06/2022 FINDINGS: Heart is normal size. Worsening patchy bilateral airspace disease and interstitial prominence. No effusions or pneumothorax. No acute bony abnormality. IMPRESSION: Worsening bilateral patchy airspace disease and interstitial prominence concerning for pneumonia. Electronically Signed   By: Rolm Baptise M.D.   On: 02/07/2022 08:33   US RENAL  Result Date: 02/06/2022 CLINICAL DATA:  Acute kidney injury EXAM: RENAL / URINARY TRACT ULTRASOUND COMPLETE COMPARISON:  None Available. FINDINGS: Right Kidney: Length = 9.9 cm AP renal pelvis diameter = <10 mm Normal parenchymal echogenicity with preserved corticomedullary differentiation. No urinary tract dilation or shadowing calculi. The ureter is not seen. Left Kidney: Length = 9.5 cm AP renal pelvis diameter = <10 mm Lower pole simple cyst measures 3.2 cm. No follow-up imaging recommended. Normal parenchymal echogenicity with preserved corticomedullary differentiation. No urinary tract dilation or shadowing calculi. The ureter is not seen. Bladder: Decompressed with catheter in-situ. Other: Layering sludge within the gallbladder. No gallbladder mural thickening or pericholecystic free fluid. Left pleural effusion. IMPRESSION: 1.  No urinary tract dilation or shadowing calculi. 2. Gallbladder sludge without evidence of acute cholecystitis. 3. Left pleural effusion. Electronically Signed   By: Darrin Nipper M.D.   On: 02/06/2022 11:07   DG Chest Port 1 View  Result Date: 02/06/2022 CLINICAL DATA:  Shortness of breath. EXAM: PORTABLE CHEST 1  VIEW COMPARISON:  One-view chest x-ray 02/05/2022. FINDINGS: Heart size is  normal. Slight increase of interstitial and airspace opacities is noted bilaterally, particularly at the right base. No significant edema or effusions are present. IMPRESSION: Slight increase of interstitial and airspace opacities bilaterally, particularly at the right base. While this may represent atelectasis, infection or aspiration are not excluded. Electronically Signed   By: San Morelle M.D.   On: 02/06/2022 06:38   DG Chest Port 1 View  Result Date: 02/05/2022 CLINICAL DATA:  88 year old male with shortness of breath. EXAM: PORTABLE CHEST 1 VIEW COMPARISON:  Portable chest 02/04/2022 and earlier. FINDINGS: Portable AP semi upright view at 0730 hours. Mildly rotated to the right. Lung volumes and mediastinal contours are stable. Lung markings appear stable. Allowing for portable technique the lungs are clear. No pneumothorax. Negative visible bowel gas. No acute osseous abnormality identified. IMPRESSION: No acute cardiopulmonary abnormality. Electronically Signed   By: Genevie Ann M.D.   On: 02/05/2022 07:44   DG Chest Port 1 View  Result Date: 02/04/2022 CLINICAL DATA:  Provided history: Shortness of breath, nausea. EXAM: PORTABLE CHEST 1 VIEW COMPARISON:  Prior chest radiographs 02/03/2022 and earlier. FINDINGS: Somewhat shallow inspiration radiograph. The cardiomediastinal silhouette is unchanged. Minimal ill-defined opacity within the medial lung bases, bilaterally, likely reflecting atelectasis. No evidence of pleural effusion or pneumothorax. No acute bony abnormality identified. Degenerative changes of the spine. IMPRESSION: Shallow inspiration radiograph. Minimal ill-defined opacity within the medial lung bases, bilaterally, likely reflecting atelectasis. Electronically Signed   By: Kellie Simmering D.O.   On: 02/04/2022 08:34   EEG adult  Result Date: 02/03/2022 Lora Havens, MD     02/03/2022  8:20 PM Patient Name: Karl Luke. MRN: [id number] Epilepsy Attending: Lora Havens  Referring Physician/Provider: Thurnell Lose, MD Date: 02/03/2022 Duration: 23.17 mins Patient history: 88yo M with ams. EEG to evaluate for seizure Level of alertness: Awake, asleep AEDs during EEG study: None Technical aspects: This EEG study was done with scalp electrodes positioned according to the 10-20 International system of electrode placement. Electrical activity was reviewed with band pass filter of 1-'70Hz'$ , sensitivity of 7 uV/mm, display speed of 72m/sec with a '60Hz'$  notched filter applied as appropriate. EEG data were recorded continuously and digitally stored.  Video monitoring was available and reviewed as appropriate. Description: The posterior dominant rhythm consists of 8 Hz activity of moderate voltage (25-35 uV) seen predominantly in posterior head regions, symmetric and reactive to eye opening and eye closing. Sleep was characterized by vertex waves, sleep spindles (12 to 14 Hz), maximal frontocentral region. EEG showed intermittent generalized 5 to 6 Hz theta slowing. Hyperventilation and photic stimulation were not performed.   ABNORMALITY - Intermittent slow, generalized IMPRESSION: This study is suggestive of mild diffuse encephalopathy, nonspecific etiology. No seizures or epileptiform discharges were seen throughout the recording. PLora Havens  DG Chest Port 1 View  Result Date: 02/03/2022 CLINICAL DATA:  88year old male with altered mental status, nausea, leukocytosis. EXAM: PORTABLE CHEST 1 VIEW COMPARISON:  Recent CT Abdomen and Pelvis 02/01/2022. Portable chest x-ray yesterday. FINDINGS: Portable AP semi upright view at 0719 hours. Stable low lung volumes. Stable mediastinal contours, no cardiomegaly. Allowing for portable technique the lungs are clear. Visualized tracheal air column is within normal limits. Stable visualized osseous structures. IMPRESSION: Stable low lung volumes. No acute cardiopulmonary abnormality. Electronically Signed   By: HGenevie AnnM.D.   On:  02/03/2022 07:35   DG Abd Portable 1V  Result Date: 02/03/2022 CLINICAL DATA:  88 year old male with altered mental status, nausea, leukocytosis. EXAM: PORTABLE ABDOMEN - 1 VIEW COMPARISON:  Recent CT Abdomen and Pelvis 02/01/2022. FINDINGS: Portable AP view at 0716 hours. Bowel gas pattern is similar and remains non obstructed. Visible abdominal and pelvic visceral contours appear stable. Pelvic phleboliths. Stable visualized osseous structures. IMPRESSION: Bowel-gas pattern remains non obstructed, no acute radiographic finding. Electronically Signed   By: Genevie Ann M.D.   On: 02/03/2022 07:34   ECHOCARDIOGRAM COMPLETE  Result Date: 02/02/2022    ECHOCARDIOGRAM REPORT   Patient Name:   Esmond Hinch. Date of Exam: 02/02/2022 Medical Rec #:  [id number]       Height:       69.0 in Accession #:    [id number]      Weight:       145.0 lb Date of Birth:  08-Jan-1930       BSA:          1.802 m Patient Age:    69 years        BP:           95/66 mmHg Patient Gender: M               HR:           108 bpm. Exam Location:  Inpatient Procedure: 2D Echo, Cardiac Doppler, Color Doppler and Intracardiac            Opacification Agent Indications:    CHF- Acute Diastolic J24.26  History:        Patient has no prior history of Echocardiogram examinations.                 Abnormal ECG; Risk Factors:Dyslipidemia.  Sonographer:    Ronny Flurry Referring Phys: Graylin Shiver [facility]  Sonographer Comments: No parasternal window, suboptimal apical window and Technically difficult study due to poor echo windows. Image acquisition challenging due to patient body habitus and Image acquisition challenging due to respiratory motion. IMPRESSIONS  1. Left ventricular ejection fraction, by estimation, is 70 to 75%. The left ventricle has hyperdynamic function. The left ventricle has no regional wall motion abnormalities. Left ventricular diastolic parameters are indeterminate.  2. Right ventricular systolic function is normal. The right  ventricular size is normal. Tricuspid regurgitation signal is inadequate for assessing PA pressure.  3. The mitral valve is normal in structure. No evidence of mitral valve regurgitation. No evidence of mitral stenosis.  4. The aortic valve was not well visualized. Aortic valve regurgitation is not visualized. No aortic stenosis is present.  5. The inferior vena cava is normal in size with <50% respiratory variability, suggesting right atrial pressure of 8 mmHg. FINDINGS  Left Ventricle: Left ventricular ejection fraction, by estimation, is 70 to 75%. The left ventricle has hyperdynamic function. The left ventricle has no regional wall motion abnormalities. Definity contrast agent was given IV to delineate the left ventricular endocardial borders. The left ventricular internal cavity size was small. There is no left ventricular hypertrophy. Left ventricular diastolic parameters are indeterminate. Right Ventricle: The right ventricular size is normal. No increase in right ventricular wall thickness. Right ventricular systolic function is normal. Tricuspid regurgitation signal is inadequate for assessing PA pressure. Left Atrium: Left atrial size was normal in size. Right Atrium: Right atrial size was normal in size. Pericardium: Trivial pericardial effusion is present. Mitral Valve: The mitral valve is normal in structure. No evidence of mitral valve regurgitation. No evidence of  mitral valve stenosis. Tricuspid Valve: The tricuspid valve is normal in structure. Tricuspid valve regurgitation is trivial. Aortic Valve: The aortic valve was not well visualized. Aortic valve regurgitation is not visualized. No aortic stenosis is present. Aortic valve mean gradient measures 6.0 mmHg. Aortic valve peak gradient measures 10.5 mmHg. Pulmonic Valve: The pulmonic valve was not well visualized. Pulmonic valve regurgitation is not visualized. Aorta: The aortic root was not well visualized. Venous: The inferior vena cava is  normal in size with less than 50% respiratory variability, suggesting right atrial pressure of 8 mmHg. IAS/Shunts: The interatrial septum was not well visualized.   LV Volumes (MOD) LV vol d, MOD A2C: 48.5 ml Diastology LV vol d, MOD A4C: 82.9 ml LV e' medial:    6.20 cm/s LV vol s, MOD A2C: 13.4 ml LV E/e' medial:  11.8 LV vol s, MOD A4C: 16.6 ml LV e' lateral:   7.62 cm/s LV SV MOD A2C:     35.1 ml LV E/e' lateral: 9.6 LV SV MOD A4C:     82.9 ml LV SV MOD BP:      49.4 ml RIGHT VENTRICLE            IVC RV Basal diam:  2.30 cm    IVC diam: 1.60 cm RV S prime:     9.95 cm/s TAPSE (M-mode): 1.2 cm LEFT ATRIUM           Index        RIGHT ATRIUM          Index LA Vol (A2C): 21.0 ml 11.65 ml/m  RA Area:     9.94 cm LA Vol (A4C): 15.4 ml 8.55 ml/m   RA Volume:   17.90 ml 9.93 ml/m  AORTIC VALVE AV Vmax:           162.00 cm/s AV Vmean:          111.000 cm/s AV VTI:            0.240 m AV Peak Grad:      10.5 mmHg AV Mean Grad:      6.0 mmHg LVOT Vmax:         135.67 cm/s LVOT Vmean:        91.633 cm/s LVOT VTI:          0.195 m LVOT/AV VTI ratio: 0.81 MITRAL VALVE MV Area (PHT): 4.39 cm    SHUNTS MV Decel Time: 173 msec    Systemic VTI: 0.19 m MR Peak grad: 8.3 mmHg MR Vmax:      144.00 cm/s MV E velocity: 73.20 cm/s MV A velocity: 86.40 cm/s MV E/A ratio:  0.85 Oswaldo Milian MD Electronically signed by Oswaldo Milian MD Signature Date/Time: 02/02/2022/3:07:23 PM    Final    DG Chest Port 1 View  Result Date: 02/02/2022 CLINICAL DATA:  87 year old male with history of shortness of breath. EXAM: PORTABLE CHEST 1 VIEW COMPARISON:  Chest x-ray 02/01/2022. FINDINGS: Lung volumes are normal. No consolidative airspace disease. No pleural effusions. No pneumothorax. No pulmonary nodule or mass noted. Pulmonary vasculature and the cardiomediastinal silhouette are within normal limits. IMPRESSION: No radiographic evidence of acute cardiopulmonary disease. Electronically Signed   By: Vinnie Langton M.D.   On:  02/02/2022 07:09   DG Chest Port 1 View  Result Date: 02/01/2022 CLINICAL DATA:  Shortness of breath EXAM: PORTABLE CHEST 1 VIEW COMPARISON:  02/07/2022 FINDINGS: Lungs are clear.  No pleural effusion or pneumothorax. The heart is normal in size.  IMPRESSION: No evidence of acute cardiopulmonary disease. Electronically Signed   By: Julian Hy M.D.   On: 02/01/2022 20:03   CT ABDOMEN PELVIS WO CONTRAST  Result Date: 02/01/2022 CLINICAL DATA:  Kidney failure weakness, decreased appetite, dementia EXAM: CT ABDOMEN AND PELVIS WITHOUT CONTRAST TECHNIQUE: Multidetector CT imaging of the abdomen and pelvis was performed following the standard protocol without IV contrast. RADIATION DOSE REDUCTION: This exam was performed according to the departmental dose-optimization program which includes automated exposure control, adjustment of the mA and/or kV according to patient size and/or use of iterative reconstruction technique. COMPARISON:  CT chest abdomen pelvis dated 04/04/2021. FINDINGS: Lower chest: Mild right basilar atelectasis. Hepatobiliary: Unenhanced liver is unremarkable. Gallbladder is unremarkable. No intrahepatic or extrahepatic duct dilatation. Pancreas: Within normal limits. Spleen: Within normal limits. Adrenals/Urinary Tract: Adrenal glands are within normal limits. 3.7 cm left lower pole renal cyst (series 3/image 39), measuring simple fluid density, benign (Bosniak I). No follow-up is recommended. Kidneys are otherwise within normal limits.  No hydronephrosis. Bladder is decompressed by an indwelling Foley catheter. Stomach/Bowel: Stomach is within normal limits. No evidence of bowel obstruction. Normal appendix (series 3/image 62). Scattered mild colonic diverticulosis, without evidence of diverticulitis. Vascular/Lymphatic: No evidence of abdominal aortic aneurysm. Atherosclerotic calcifications of the abdominal aorta and branch vessels. No suspicious abdominopelvic lymphadenopathy.  Reproductive: Prostate is grossly unremarkable. Other: No abdominopelvic ascites. Musculoskeletal: Mild degenerative changes of the lower thoracic spine. IMPRESSION: Unremarkable CT abdomen/pelvis. Electronically Signed   By: Julian Hy M.D.   On: 02/01/2022 01:44   CT HEAD WO CONTRAST (5MM)  Result Date: 02/06/2022 CLINICAL DATA:  Mental status change, unknown cause EXAM: CT HEAD WITHOUT CONTRAST TECHNIQUE: Contiguous axial images were obtained from the base of the skull through the vertex without intravenous contrast. RADIATION DOSE REDUCTION: This exam was performed according to the departmental dose-optimization program which includes automated exposure control, adjustment of the mA and/or kV according to patient size and/or use of iterative reconstruction technique. COMPARISON:  None Available. FINDINGS: Brain: Generalized atrophy is normal for age. There is moderate to advanced periventricular and deep white matter hypodensity typical of chronic small vessel ischemia. Remote lacunar infarcts in the right basal ganglia. No evidence of acute ischemia. No hemorrhage. No subdural or extra-axial collection. No hydrocephalus, midline shift or mass effect. Vascular: Atherosclerosis of skullbase vasculature without hyperdense vessel or abnormal calcification. Skull: No fracture or focal lesion. Sinuses/Orbits: No acute findings.  Bilateral intersection. Other: None. IMPRESSION: 1. No acute intracranial abnormality. 2. Generalized atrophy and chronic small vessel ischemia. Remote lacunar infarcts in the right basal ganglia. Electronically Signed   By: Keith Rake M.D.   On: 02/06/2022 21:54   DG Chest Portable 1 View  Result Date: 02/03/2022 CLINICAL DATA:  Altered mental status EXAM: PORTABLE CHEST 1 VIEW COMPARISON:  Radiographs 04/05/2017 FINDINGS: No focal consolidation, pleural effusion, or pneumothorax. Normal cardiomediastinal silhouette. No acute osseous abnormality. IMPRESSION: No active  disease. Electronically Signed   By: Placido Sou M.D.   On: 02/23/2022 21:49    Microbiology: Recent Results (from the past 240 hour(s))  MRSA Next Gen by PCR, Nasal     Status: None   Collection Time: 02/03/22  6:04 PM   Specimen: Nasal Mucosa; Nasal Swab  Result Value Ref Range Status   MRSA by PCR Next Gen NOT DETECTED NOT DETECTED Final    Comment: (NOTE) The GeneXpert MRSA Assay (FDA approved for NASAL specimens only), is one component of a comprehensive MRSA colonization surveillance program.  It is not intended to diagnose MRSA infection nor to guide or monitor treatment for MRSA infections. Test performance is not FDA approved in patients less than 38 years old. Performed at [facility], [place] [address], [place], [place] [address]   Culture, blood (Routine X 2) w Reflex to ID Panel     Status: None   Collection Time: 02/03/22  6:10 PM   Specimen: BLOOD RIGHT HAND  Result Value Ref Range Status   Specimen Description BLOOD RIGHT HAND  Final   Special Requests   Final    BOTTLES DRAWN AEROBIC AND ANAEROBIC Blood Culture results may not be optimal due to an excessive volume of blood received in culture bottles   Culture   Final    NO GROWTH 5 DAYS Performed at [facility], [place] [address] [place], [place] [address]    Report Status 02/08/2022 FINAL  Final  Culture, blood (Routine X 2) w Reflex to ID Panel     Status: None   Collection Time: 02/03/22  6:20 PM   Specimen: BLOOD RIGHT HAND  Result Value Ref Range Status   Specimen Description BLOOD RIGHT HAND  Final   Special Requests   Final    BOTTLES DRAWN AEROBIC AND ANAEROBIC Blood Culture results may not be optimal due to an excessive volume of blood received in culture bottles   Culture   Final    NO GROWTH 5 DAYS Performed at [facility], [place] [address] [place], [place] [address]    Report Status 02/08/2022 FINAL  Final     Signed: Phillips Climes, MD

## 2022-02-27 DEATH — deceased

## 2022-05-04 ENCOUNTER — Ambulatory Visit: Payer: Medicare PPO

## 2022-07-24 ENCOUNTER — Ambulatory Visit: Payer: Medicare PPO | Admitting: Internal Medicine

## 2022-10-21 IMAGING — PT NM PET SKULL BASE TO THIGH
1 of 7 series · 1 of 25 positions shown · non-contrast
Comparison: Fluciclovine scan 12/20/2018

CLINICAL DATA: Prostate carcinoma with biochemical recurrence.

EXAM:
NUCLEAR MEDICINE PET SKULL BASE TO THIGH
TECHNIQUE: 9.7 mCi F-18 Fluciclovine was injected intravenously. Full-ring PET
imaging was performed from the skull base to thigh after the
radiotracer. CT data was obtained and used for attenuation
correction and anatomic localization.

[Series 4: ct sk_thigh 5.0 bf37 · axial · 5.0mm · 0.98mm/px · 1 of 226 slices shown]
[im 226/226  brain]
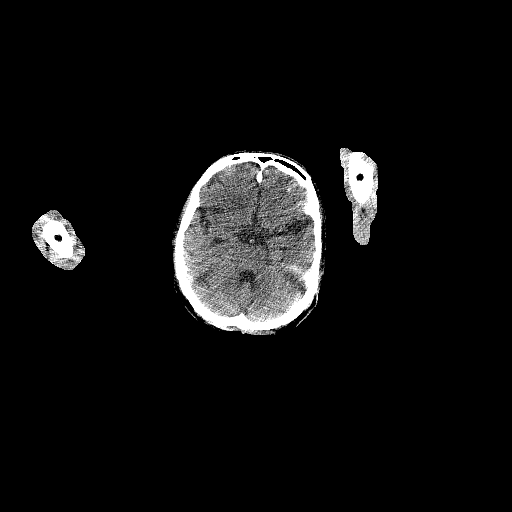

[1 of 25 positions shown; findings below may reference images not displayed]

FINDINGS: NECK

No radiotracer activity in neck lymph nodes.

Incidental CT finding: None

CHEST

Oblong nodule along the LEFT oblique fissure measuring 6 mm is
unchanged. No radiotracer accumulation. Radiotracer accumulation
within mediastinal lymph nodes. No enlarged mediastinal lymph nodes.

Incidental CT finding: Coronary artery calcification and aortic
atherosclerotic calcification.

ABDOMEN/PELVIS

Prostate: Again demonstrated intense radiotracer activity throughout
the prostate gland with SUV max equal 9.4 compared to
comparison exam. Visually the pattern is near identical to prior.

Lymph nodes: No abnormal radiotracer accumulation within pelvic or
abdominal nodes.

Liver: No evidence of liver metastasis

Incidental CT finding: Benign cyst of the LEFT kidney.
Atherosclerotic calcification of the aorta.

SKELETON

No focal  activity to suggest skeletal metastasis.
IMPRESSION: 1. Persistent intense radiotracer activity throughout the prostate
gland is most consistent with residual/recurrent prostate carcinoma.
2. No evidence nodal metastasis, visceral metastasis or skeletal
metastasis.
